# Patient Record
Sex: Female | Born: 1990 | Race: Black or African American | Hispanic: No | Marital: Single | Smoking: Former smoker
Health system: Southern US, Community
[De-identification: ages and names within clinical notes are randomized; demographics above are authoritative.]

## PROBLEM LIST (undated history)

## (undated) ENCOUNTER — Inpatient Hospital Stay (HOSPITAL_COMMUNITY): Payer: Self-pay

## (undated) DIAGNOSIS — Z789 Other specified health status: Secondary | ICD-10-CM

## (undated) DIAGNOSIS — O4290 Premature rupture of membranes, unspecified as to length of time between rupture and onset of labor, unspecified weeks of gestation: Secondary | ICD-10-CM

## (undated) DIAGNOSIS — I10 Essential (primary) hypertension: Secondary | ICD-10-CM

## (undated) DIAGNOSIS — A64 Unspecified sexually transmitted disease: Secondary | ICD-10-CM

## (undated) HISTORY — PX: TOOTH EXTRACTION: SUR596

## (undated) HISTORY — DX: Premature rupture of membranes, unspecified as to length of time between rupture and onset of labor, unspecified weeks of gestation: O42.90

---
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
## 2012-06-06 ENCOUNTER — Encounter (HOSPITAL_COMMUNITY): Payer: Self-pay | Admitting: *Deleted

## 2012-06-06 ENCOUNTER — Emergency Department (HOSPITAL_COMMUNITY)
Admission: EM | Admit: 2012-06-06 | Discharge: 2012-06-06 | Disposition: A | Discharge status: Home / Self Care | Payer: Self-pay | Attending: Emergency Medicine | Admitting: Emergency Medicine

## 2012-06-06 DIAGNOSIS — F17200 Nicotine dependence, unspecified, uncomplicated: Secondary | ICD-10-CM | POA: Insufficient documentation

## 2012-06-06 DIAGNOSIS — M549 Dorsalgia, unspecified: Secondary | ICD-10-CM

## 2012-06-06 DIAGNOSIS — N390 Urinary tract infection, site not specified: Secondary | ICD-10-CM

## 2012-06-06 LAB — URINALYSIS, ROUTINE W REFLEX MICROSCOPIC
Bilirubin Urine: NEGATIVE
Glucose, UA: NEGATIVE mg/dL
Ketones, ur: NEGATIVE mg/dL
Nitrite: NEGATIVE
Protein, ur: 30 mg/dL — AB
Specific Gravity, Urine: 1.012 (ref 1.005–1.030)
Urobilinogen, UA: 2 mg/dL — ABNORMAL HIGH (ref 0.0–1.0)
pH: 6 (ref 5.0–8.0)

## 2012-06-06 LAB — URINE MICROSCOPIC-ADD ON

## 2012-06-06 LAB — PREGNANCY, URINE: Preg Test, Ur: NEGATIVE

## 2012-06-06 MED ORDER — IBUPROFEN 400 MG PO TABS
400.0000 mg | ORAL_TABLET | Freq: Once | ORAL | Status: AC
  Administered 2012-06-06: 400 mg via ORAL
  Filled 2012-06-06: qty 1

## 2012-06-06 MED ORDER — SULFAMETHOXAZOLE-TRIMETHOPRIM 800-160 MG PO TABS: 1.0000 | ORAL_TABLET | Freq: Two times a day (BID) | ORAL | Status: AC

## 2012-06-06 NOTE — ED Provider Notes (Signed)
History   This chart was scribed for Raeford Razor, MD by Charolett Bumpers . The patient was seen in room TR07C/TR07C.    CSN: [id number]  Arrival date & time 06/06/12  1624   First MD Initiated Contact with Patient 06/06/12 1717      Chief Complaint  Patient presents with  . Back Pain    (Consider location/radiation/quality/duration/timing/severity/associated sxs/prior treatment) HPI Carrie Hampton is a 21 y.o. female who presents to the Emergency Department complaining of constant, moderate lower back pain for the past 4 days. Patient also reports associated lower abdominal pain and increased urinary frequency. Patient denies any urinary incontinence. Pt states that she works at [employer] and does a lot of standing at work and has worked multiple shifts over the past few days. Pt states that she took Hydrocodone with no relief of the back pain. Pt reports that she bleed for 2 weeks last month with her menstrual period, and started spotting today. Patient states that this is atypical for her menstrual periods. Pt states that she is sexually active. Pt denies any h/o medical problems. Pt denies any h/o back surgeries. Pt denies taking any blood thinners or h/o IV drugs  History reviewed. No pertinent past medical history.  History reviewed. No pertinent past surgical history.  No family history on file.  History  Substance Use Topics  . Smoking status: Current Everyday Smoker  . Smokeless tobacco: Not on file  . Alcohol Use: Yes    OB History    Grav Para Term Preterm Abortions TAB SAB Ect Mult Living                  Review of Systems  Gastrointestinal: Positive for abdominal pain.       No bowel incontinence.   Genitourinary: Positive for frequency.       No urinary incontinence.   Musculoskeletal: Positive for back pain.    Allergies  Review of patient's allergies indicates no known allergies.  Home Medications   Current Outpatient Rx  Name Route Sig  Dispense Refill  . SULFAMETHOXAZOLE-TRIMETHOPRIM 800-160 MG PO TABS Oral Take 1 tablet by mouth 2 (two) times daily. 10 tablet 0    BP 109/75  Pulse 97  Temp 98.7 F (37.1 C) (Oral)  Resp 20  SpO2 100%  LMP 06/06/2012  Physical Exam  Nursing note and vitals reviewed. Constitutional: She is oriented to person, place, and time. She appears well-developed and well-nourished. No distress.  HENT:  Head: Normocephalic and atraumatic.  Eyes: EOM are normal.  Neck: Normal range of motion. Neck supple. No tracheal deviation present.  Cardiovascular: Normal rate, regular rhythm and normal heart sounds.   No murmur heard. Pulmonary/Chest: Effort normal and breath sounds normal. No respiratory distress. She has no wheezes.  Musculoskeletal: Normal range of motion.       Mild lower thoracic perivertebral tenderness. No midline spinal tenderness.    Neurological: She is alert and oriented to person, place, and time.       Gait is steady. Sensation intact with light touch. Strength normal in lower extremities.   Skin: Skin is warm and dry.       No overlaying skin changes.  Psychiatric: She has a normal mood and affect. Her behavior is normal.    ED Course  Procedures (including critical care time)  DIAGNOSTIC STUDIES: Oxygen Saturation is 100% on room air, normal by my interpretation.    COORDINATION OF CARE:  1731: Discussed planned course of  treatment with the patient, who is agreeable at this time. Will order a urinalysis and pregnancy test.  1745: Medication Orders: Ibuprofen (Advil, Motrin) tablet 400 mg-once.    Labs Reviewed  URINALYSIS, ROUTINE W REFLEX MICROSCOPIC - Abnormal; Notable for the following:    APPearance CLOUDY (*)     Hgb urine dipstick LARGE (*)     Protein, ur 30 (*)     Urobilinogen, UA 2.0 (*)     Leukocytes, UA LARGE (*)     All other components within normal limits  URINE MICROSCOPIC-ADD ON - Abnormal; Notable for the following:    Squamous  Epithelial / LPF FEW (*)     All other components within normal limits  PREGNANCY, URINE   No results found.   1. Back pain   2. UTI (lower urinary tract infection)       MDM  21yF with atraumatic back pain. Nonfocal neuro exam. Urinary frequency and UA suggestive of infection. Plan abx and symptomatic tx of back pain. Return precautions discussed.  I personally preformed the services scribed in my presence. The recorded information has been reviewed and considered. Raeford Razor, MD.        Raeford Razor, MD 06/13/12 [phone]

## 2012-06-06 NOTE — ED Notes (Signed)
The pt is c/o lower back pain after standing up at work at [employer] 4 days  In a row

## 2012-11-19 ENCOUNTER — Encounter (HOSPITAL_COMMUNITY): Payer: Self-pay | Admitting: *Deleted

## 2012-11-19 ENCOUNTER — Inpatient Hospital Stay (HOSPITAL_COMMUNITY)
Admission: AD | Admit: 2012-11-19 | Discharge: 2012-11-19 | Disposition: A | Discharge status: Home / Self Care | Payer: Self-pay | Source: Ambulatory Visit | Attending: Obstetrics & Gynecology | Admitting: Obstetrics & Gynecology

## 2012-11-19 ENCOUNTER — Inpatient Hospital Stay (HOSPITAL_COMMUNITY): Payer: Self-pay

## 2012-11-19 DIAGNOSIS — O209 Hemorrhage in early pregnancy, unspecified: Secondary | ICD-10-CM | POA: Insufficient documentation

## 2012-11-19 HISTORY — DX: Other specified health status: Z78.9

## 2012-11-19 LAB — URINALYSIS, ROUTINE W REFLEX MICROSCOPIC
Leukocytes, UA: NEGATIVE
Nitrite: NEGATIVE
Protein, ur: NEGATIVE mg/dL
Specific Gravity, Urine: >1.03 — ABNORMAL HIGH (ref 1.005–1.030)
Urobilinogen, UA: 0.2 mg/dL (ref 0.0–1.0)

## 2012-11-19 LAB — CBC WITH DIFFERENTIAL/PLATELET
Basophils Absolute: 0 10*3/uL (ref 0.0–0.1)
Eosinophils Absolute: 0.1 10*3/uL (ref 0.0–0.7)
Eosinophils Relative: 2 % (ref 0–5)
Lymphocytes Relative: 32 % (ref 12–46)
Lymphs Abs: 2.3 10*3/uL (ref 0.7–4.0)
MCH: 29 pg (ref 26.0–34.0)
MCV: 84.7 fL (ref 78.0–100.0)
Neutrophils Relative %: 60 % (ref 43–77)
Platelets: 204 10*3/uL (ref 150–400)
RBC: 4.72 MIL/uL (ref 3.87–5.11)
RDW: 12.1 % (ref 11.5–15.5)
WBC: 7 10*3/uL (ref 4.0–10.5)

## 2012-11-19 LAB — POCT PREGNANCY, URINE: Preg Test, Ur: POSITIVE — AB

## 2012-11-19 LAB — HCG, QUANTITATIVE, PREGNANCY: hCG, Beta Chain, Quant, S: 1907 m[IU]/mL — ABNORMAL HIGH (ref <5)

## 2012-11-19 LAB — WET PREP, GENITAL
Clue Cells Wet Prep HPF POC: NONE SEEN
Trich, Wet Prep: NONE SEEN
Yeast Wet Prep HPF POC: NONE SEEN

## 2012-11-19 LAB — ABO/RH: ABO/RH(D): O POS

## 2012-11-19 LAB — URINE MICROSCOPIC-ADD ON

## 2012-11-19 NOTE — MAU Provider Note (Signed)
History     CSN: [id number]  Arrival date and time: 11/19/12 1642   First Provider Initiated Contact with Patient 11/19/12 1727      Chief Complaint  Patient presents with  . Possible Pregnancy   HPI: Carrie Hampton is a 21 y.o. female who presents to MAU with vaginal bleeding. The bleeding started today. She describes the bleeding as lighter than a period. Associated symptoms include abdominal cramping. LMP 09/10/12. No birth control. Last pap smear = never had one. Current sex partner x 18 months. Hx of Chlamydia 2 months ago. Patient and partner treated. The history was provided by the patient.  OB History    Grav Para Term Preterm Abortions TAB SAB Ect Mult Living   0               Past Medical History  Diagnosis Date  . No pertinent past medical history     Past Surgical History  Procedure Date  . No past surgeries     Family History  Problem Relation Age of Onset  . Alcohol abuse Neg Hx   . Arthritis Neg Hx   . Asthma Neg Hx   . Birth defects Neg Hx   . Cancer Neg Hx   . COPD Neg Hx   . Depression Neg Hx   . Diabetes Neg Hx   . Drug abuse Neg Hx   . Early death Neg Hx   . Hearing loss Neg Hx   . Heart disease Neg Hx   . Hyperlipidemia Neg Hx   . Hypertension Neg Hx   . Kidney disease Neg Hx   . Learning disabilities Neg Hx   . Mental illness Neg Hx   . Mental retardation Neg Hx   . Miscarriages / Stillbirths Neg Hx   . Stroke Neg Hx   . Vision loss Neg Hx     History  Substance Use Topics  . Smoking status: Former Smoker    Quit date: 10/20/2012  . Smokeless tobacco: Not on file  . Alcohol Use: Yes    Allergies: No Known Allergies  No prescriptions prior to admission    Review of Systems  Constitutional: Negative for fever, chills and weight loss.  HENT: Negative for ear pain, nosebleeds, congestion, sore throat and neck pain.   Eyes: Negative for blurred vision, double vision, photophobia and pain.  Respiratory: Negative for cough,  shortness of breath and wheezing.   Cardiovascular: Negative for chest pain, palpitations and leg swelling.  Gastrointestinal: Positive for abdominal pain. Negative for heartburn, nausea, vomiting, diarrhea and constipation.  Genitourinary: Positive for frequency. Negative for dysuria and urgency.  Musculoskeletal: Negative for myalgias and back pain.  Skin: Positive for rash. Negative for itching.  Neurological: Negative for dizziness, sensory change, speech change, seizures, weakness and headaches.  Endo/Heme/Allergies: Does not bruise/bleed easily.  Psychiatric/Behavioral: Negative for depression and substance abuse. The patient is not nervous/anxious and does not have insomnia.    Physical Exam   Blood pressure 112/69, temperature 98.2 F (36.8 C), temperature source Oral, height 5' 3.5" (1.613 m), weight 106 lb 12.8 oz (48.444 kg), last menstrual period 09/10/2012.  Physical Exam  Nursing note and vitals reviewed. Constitutional: She is oriented to person, place, and time. She appears well-developed and well-nourished. No distress.  HENT:  Head: Normocephalic and atraumatic.  Eyes: EOM are normal.  Neck: Neck supple.  Cardiovascular: Normal rate.   Respiratory: Effort normal.  GI: Soft. There is no tenderness.  Genitourinary:  External genitalia without lesions. Moderate blood vaginal vault. Cervix inflamed, closed, no CMT, no adnexal tenderness. Uterus not enlarged.   Musculoskeletal: Normal range of motion.  Neurological: She is alert and oriented to person, place, and time.  Skin: Skin is warm and dry.  Psychiatric: She has a normal mood and affect. Her behavior is normal. Judgment and thought content normal.    Results for orders placed during the hospital encounter of 11/19/12 (from the past 24 hour(s))  URINALYSIS, ROUTINE W REFLEX MICROSCOPIC     Status: Abnormal   Collection Time   11/19/12  4:55 PM      Component Value Range   Color, Urine YELLOW  YELLOW    APPearance CLEAR  CLEAR   Specific Gravity, Urine >1.030 (*) 1.005 - 1.030   pH 6.0  5.0 - 8.0   Glucose, UA NEGATIVE  NEGATIVE mg/dL   Hgb urine dipstick LARGE (*) NEGATIVE   Bilirubin Urine NEGATIVE  NEGATIVE   Ketones, ur 15 (*) NEGATIVE mg/dL   Protein, ur NEGATIVE  NEGATIVE mg/dL   Urobilinogen, UA 0.2  0.0 - 1.0 mg/dL   Nitrite NEGATIVE  NEGATIVE   Leukocytes, UA NEGATIVE  NEGATIVE  URINE MICROSCOPIC-ADD ON     Status: Abnormal   Collection Time   11/19/12  4:55 PM      Component Value Range   Squamous Epithelial / LPF FEW (*) RARE   WBC, UA 0-2  <3 WBC/hpf   RBC / HPF 21-50  <3 RBC/hpf   Urine-Other MUCOUS PRESENT    POCT PREGNANCY, URINE     Status: Abnormal   Collection Time   11/19/12  5:06 PM      Component Value Range   Preg Test, Ur POSITIVE (*) NEGATIVE  CBC WITH DIFFERENTIAL     Status: Normal   Collection Time   11/19/12  5:45 PM      Component Value Range   WBC 7.0  4.0 - 10.5 K/uL   RBC 4.72  3.87 - 5.11 MIL/uL   Hemoglobin 13.7  12.0 - 15.0 g/dL   HCT 38.7  56.4 - 33.2 %   MCV 84.7  78.0 - 100.0 fL   MCH 29.0  26.0 - 34.0 pg   MCHC 34.3  30.0 - 36.0 g/dL   RDW 95.1  88.4 - 16.6 %   Platelets 204  150 - 400 K/uL   Neutrophils Relative 60  43 - 77 %   Neutro Abs 4.2  1.7 - 7.7 K/uL   Lymphocytes Relative 32  12 - 46 %   Lymphs Abs 2.3  0.7 - 4.0 K/uL   Monocytes Relative 5  3 - 12 %   Monocytes Absolute 0.4  0.1 - 1.0 K/uL   Eosinophils Relative 2  0 - 5 %   Eosinophils Absolute 0.1  0.0 - 0.7 K/uL   Basophils Relative 0  0 - 1 %   Basophils Absolute 0.0  0.0 - 0.1 K/uL  HCG, QUANTITATIVE, PREGNANCY     Status: Abnormal   Collection Time   11/19/12  5:45 PM      Component Value Range   hCG, Beta Chain, Quant, S 1907 (*) <5 mIU/mL  ABO/RH     Status: Normal (Preliminary result)   Collection Time   11/19/12  5:45 PM      Component Value Range   ABO/RH(D) O POS    WET PREP, GENITAL     Status: Abnormal   Collection Time  11/19/12  6:03 PM       Component Value Range   Yeast Wet Prep HPF POC NONE SEEN  NONE SEEN   Trich, Wet Prep NONE SEEN  NONE SEEN   Clue Cells Wet Prep HPF POC NONE SEEN  NONE SEEN   WBC, Wet Prep HPF POC FEW (*) NONE SEEN   US Ob Comp Less 14 Wks  11/19/2012  *RADIOLOGY REPORT*  Clinical Data: Cramping, bleeding, pregnant, quantitative beta HCG 1907  OBSTETRIC <14 WK ULTRASOUND  Technique:  Transabdominal ultrasound was performed for evaluation of the gestation as well as the maternal uterus and adnexal regions.  Comparison:  None  Intrauterine gestational sac: Not identified Yolk sac: N/A Embryo: N/A Cardiac Activity: N/A Heart Rate: N/A bpm  Maternal uterus/Adnexae: Endometrial complex normal appearance 10 mm thick without evidence of a gestational sac or fluid. No focal uterine mass identified. Right ovary measures 3.9 x 4.0 x 1.8 cm and contains a corpus luteal cyst 1.4 cm diameter. Left ovary measures 3.6 x 2.0 x 1.7 cm in size and is normal appearance. No adnexal masses or free pelvic fluid.  IMPRESSION: No intrauterine or extrauterine gestation is identified. Differential diagnosis includes early intrauterine pregnancy too early to visualize, spontaneous abortion, and ectopic pregnancy. Serial quantitative beta HCG and/or follow-up ultrasound recommended to definitively exclude ectopic pregnancy.   Original Report Authenticated By: Ulyses Southward, M.D.    US Ob Transvaginal  11/19/2012  *RADIOLOGY REPORT*  Clinical Data: Cramping, bleeding, pregnant, quantitative beta HCG 1907  OBSTETRIC <14 WK ULTRASOUND  Technique:  Transabdominal ultrasound was performed for evaluation of the gestation as well as the maternal uterus and adnexal regions.  Comparison:  None  Intrauterine gestational sac: Not identified Yolk sac: N/A Embryo: N/A Cardiac Activity: N/A Heart Rate: N/A bpm  Maternal uterus/Adnexae: Endometrial complex normal appearance 10 mm thick without evidence of a gestational sac or fluid. No focal uterine mass  identified. Right ovary measures 3.9 x 4.0 x 1.8 cm and contains a corpus luteal cyst 1.4 cm diameter. Left ovary measures 3.6 x 2.0 x 1.7 cm in size and is normal appearance. No adnexal masses or free pelvic fluid.  IMPRESSION: No intrauterine or extrauterine gestation is identified. Differential diagnosis includes early intrauterine pregnancy too early to visualize, spontaneous abortion, and ectopic pregnancy. Serial quantitative beta HCG and/or follow-up ultrasound recommended to definitively exclude ectopic pregnancy.   Original Report Authenticated By: Ulyses Southward, M.D.     Assessment: 21 y.o. female with bleeding in early pregnancy  Diff Dx: SAB   Ectopic pregnancy   Early IUP  Plan:  Repeat Bhcg in 48 hours   Discussed with patient probably SAB   Tissue patient brought in sent to pathology lab     I have reviewed this patient's vital signs, nurses notes, appropriate labs and imaging. I have discussed the results with the  Patient and she voices understanding. MAU Course  Procedures  NEESE,HOPE 11/19/2012, 7:20 PM

## 2012-11-19 NOTE — MAU Provider Note (Signed)

## 2012-11-19 NOTE — MAU Note (Signed)
Patient states she has always had irregular periods. Had a "glob" of thick discharge with some blood in it. Having abdominal cramping since this am.

## 2012-11-19 NOTE — MAU Note (Signed)
Pt states she started having vaginal bleeding today and was passing thick clots.

## 2012-11-22 ENCOUNTER — Inpatient Hospital Stay (HOSPITAL_COMMUNITY)
Admission: AD | Admit: 2012-11-22 | Discharge: 2012-11-22 | Disposition: A | Discharge status: Home / Self Care | Payer: Self-pay | Source: Ambulatory Visit | Attending: Obstetrics & Gynecology | Admitting: Obstetrics & Gynecology

## 2012-11-22 DIAGNOSIS — O039 Complete or unspecified spontaneous abortion without complication: Secondary | ICD-10-CM | POA: Insufficient documentation

## 2012-11-22 NOTE — MAU Provider Note (Signed)
Carrie Hampton [birth year]  S:  Patient is here for follow up b-HCG level. Seen in MAU on 11/19/12 with vaginal bleeding and cramping. Her quant-HCG was 1907 at that time and ultrasound showed no intrauterine gestational sac or adnexal masses. She has had some abdominal cramping last night and this morning but is pain free at this time. She denies fever/chills. Her bleeding is minimal at this time.  O:   Filed Vitals:   11/22/12 1252  BP: 106/65  Pulse: 74  Temp: 98.1 F (36.7 C)  Resp: 16   GEN:  WNWD, no distress ABD:  Non-tender, no guarding or rebound EXTREM:  Warm, well perfused NEURO:  Alert and oriented x 4  Results for orders placed during the hospital encounter of 11/22/12 (from the past 72 hour(s))  HCG, QUANTITATIVE, PREGNANCY     Status: Abnormal   Collection Time   11/22/12 12:22 PM      Component Value Range Comment   hCG, Beta Chain, Quant, S 287 (*) <5 mIU/mL     A/P 21 y.o. G1P0010 with spontaneous abortion in first trimester. -  F/U in [facility] gyn clinic in one week for quant HCG - Strict precautions given regarding return for pain/bleeding - Patient verbalized understanding.  Napoleon Form, MD 11/22/2012 2:55 PM

## 2012-11-22 NOTE — MAU Note (Signed)
Pt states was here 11/19/2012, came on her cycle late, then noted bleeding with "chunks" of ?tissue, found out same day she was pregnant, u/s saw nothing in uterus. Here today for repeat bhcg, still bleeding, noted "chunk" this am, denies pain at present. Did have cramping last pm and earlier this am.

## 2012-11-26 NOTE — MAU Provider Note (Signed)
Attestation of Attending Supervision of Advanced Practitioner (CNM/NP): Evaluation and management procedures were performed by the Advanced Practitioner under my supervision and collaboration. I have reviewed the Advanced Practitioner's note and chart, and I agree with the management and plan.  Huel Centola H. 7:47 PM   

## 2012-12-09 ENCOUNTER — Encounter: Payer: Self-pay | Admitting: Advanced Practice Midwife

## 2013-04-01 ENCOUNTER — Inpatient Hospital Stay (HOSPITAL_COMMUNITY): Payer: Self-pay

## 2013-04-01 ENCOUNTER — Encounter (HOSPITAL_COMMUNITY): Payer: Self-pay

## 2013-04-01 ENCOUNTER — Inpatient Hospital Stay (HOSPITAL_COMMUNITY)
Admission: AD | Admit: 2013-04-01 | Discharge: 2013-04-01 | Disposition: A | Discharge status: Home / Self Care | Payer: Self-pay | Source: Ambulatory Visit | Attending: Obstetrics & Gynecology | Admitting: Obstetrics & Gynecology

## 2013-04-01 DIAGNOSIS — O4690 Antepartum hemorrhage, unspecified, unspecified trimester: Secondary | ICD-10-CM

## 2013-04-01 DIAGNOSIS — O209 Hemorrhage in early pregnancy, unspecified: Secondary | ICD-10-CM | POA: Insufficient documentation

## 2013-04-01 LAB — CBC
HCT: 40.8 % (ref 36.0–46.0)
MCH: 28.9 pg (ref 26.0–34.0)
MCHC: 33.8 g/dL (ref 30.0–36.0)
MCV: 85.5 fL (ref 78.0–100.0)
Platelets: 186 10*3/uL (ref 150–400)
RDW: 12.4 % (ref 11.5–15.5)

## 2013-04-01 LAB — URINALYSIS, ROUTINE W REFLEX MICROSCOPIC
Glucose, UA: NEGATIVE mg/dL
Ketones, ur: NEGATIVE mg/dL
Protein, ur: NEGATIVE mg/dL
Urobilinogen, UA: 1 mg/dL (ref 0.0–1.0)

## 2013-04-01 LAB — URINE MICROSCOPIC-ADD ON

## 2013-04-01 NOTE — MAU Provider Note (Signed)
History     CSN: [id number]  Arrival date and time: 04/01/13 1550   First Provider Initiated Contact with Patient 04/01/13 1640      Chief Complaint  Patient presents with  . Vaginal Bleeding   HPI  Pt is pregnant G1P0010and presents with irregular bleeding for about 3 weeks since April.  She  bled for 4 days and stopped for 2 days and then started again for 3 days and just started 03/29/2013.  She denies cramping with her bleeding.  She has the same partner for 2 years.  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 Pt denies pain with urination, constipation or diarrhea.  Pt denies dyspareunia.    Past Medical History  Diagnosis Date  . No pertinent past medical history     Past Surgical History  Procedure Laterality Date  . No past surgeries      Family History  Problem Relation Age of Onset  . Alcohol abuse Neg Hx   . Arthritis Neg Hx   . Asthma Neg Hx   . Birth defects Neg Hx   . Cancer Neg Hx   . COPD Neg Hx   . Depression Neg Hx   . Diabetes Neg Hx   . Drug abuse Neg Hx   . Early death Neg Hx   . Hearing loss Neg Hx   . Heart disease Neg Hx   . Hyperlipidemia Neg Hx   . Hypertension Neg Hx   . Kidney disease Neg Hx   . Learning disabilities Neg Hx   . Mental illness Neg Hx   . Mental retardation Neg Hx   . Miscarriages / Stillbirths Neg Hx   . Stroke Neg Hx   . Vision loss Neg Hx     History  Substance Use Topics  . Smoking status: Former Smoker    Quit date: 10/20/2012  . Smokeless tobacco: Not on file  . Alcohol Use: 1.2 oz/week    2 Shots of liquor per week     Comment: patient states that she drinks liquor once a week. 04/01/13    Allergies: No Known Allergies  Prescriptions prior to admission  Medication Sig Dispense Refill  . ibuprofen (ADVIL,MOTRIN) 200 MG tablet Take 400-600 mg by mouth every 6 (six) hours as needed for pain (For headache.).        Review of Systems  Constitutional: Negative for fever and chills.  HENT:   None now  Gastrointestinal: Negative for nausea, vomiting, abdominal pain, diarrhea and constipation.  Genitourinary: Negative for dysuria and urgency.  Neurological: Positive for headaches.   Physical Exam   Blood pressure 118/74, pulse 77, temperature 98 F (36.7 C), temperature source Oral, resp. rate 16, last menstrual period 03/29/2013, SpO2 100.00%.  Physical Exam  Nursing note and vitals reviewed. Constitutional: She is oriented to person, place, and time. She appears well-developed and well-nourished. No distress.  HENT:  Head: Normocephalic.  Eyes: Pupils are equal, round, and reactive to light.  Neck: Normal range of motion. Neck supple.  Cardiovascular: Normal rate.   Respiratory: Effort normal.  GI: Soft. She exhibits no distension. There is no tenderness. There is no rebound and no guarding.  Genitourinary:  Small amount of dark red blood in vault; cervix clean, NT, closed; uterus freely mobile nontender without palpable enlargement or tenderness- adnexa without palpable enlargement or tenderness  Musculoskeletal: Normal range of motion.  Neurological: She is alert and oriented to person, place, and time.  Skin:  Skin is warm and dry.  Psychiatric: She has a normal mood and affect.    MAU Course  Procedures Results for orders placed during the hospital encounter of 04/01/13 (from the past 24 hour(s))  CBC     Status: None   Collection Time    04/01/13  4:05 PM      Result Value Range   WBC 4.8  4.0 - 10.5 K/uL   RBC 4.77  3.87 - 5.11 MIL/uL   Hemoglobin 13.8  12.0 - 15.0 g/dL   HCT 11.9  14.7 - 82.9 %   MCV 85.5  78.0 - 100.0 fL   MCH 28.9  26.0 - 34.0 pg   MCHC 33.8  30.0 - 36.0 g/dL   RDW 56.2  13.0 - 86.5 %   Platelets 186  150 - 400 K/uL  HCG, QUANTITATIVE, PREGNANCY     Status: Abnormal   Collection Time    04/01/13  4:05 PM      Result Value Range   hCG, Beta Chain, Quant, S 131 (*) <5 mIU/mL  URINALYSIS, ROUTINE W REFLEX MICROSCOPIC     Status:  Abnormal   Collection Time    04/01/13  4:20 PM      Result Value Range   Color, Urine YELLOW  YELLOW   APPearance CLEAR  CLEAR   Specific Gravity, Urine 1.020  1.005 - 1.030   pH 6.0  5.0 - 8.0   Glucose, UA NEGATIVE  NEGATIVE mg/dL   Hgb urine dipstick MODERATE (*) NEGATIVE   Bilirubin Urine NEGATIVE  NEGATIVE   Ketones, ur NEGATIVE  NEGATIVE mg/dL   Protein, ur NEGATIVE  NEGATIVE mg/dL   Urobilinogen, UA 1.0  0.0 - 1.0 mg/dL   Nitrite NEGATIVE  NEGATIVE   Leukocytes, UA NEGATIVE  NEGATIVE  URINE MICROSCOPIC-ADD ON     Status: Abnormal   Collection Time    04/01/13  4:20 PM      Result Value Range   Squamous Epithelial / LPF FEW (*) RARE   WBC, UA 0-2  <3 WBC/hpf   RBC / HPF 0-2  <3 RBC/hpf   Bacteria, UA FEW (*) RARE   Urine-Other STARCH CRYSTALS PRESENT    POCT PREGNANCY, URINE     Status: Abnormal   Collection Time    04/01/13  4:50 PM      Result Value Range   Preg Test, Ur POSITIVE (*) NEGATIVE  WET PREP, GENITAL     Status: Abnormal   Collection Time    04/01/13  5:05 PM      Result Value Range   Yeast Wet Prep HPF POC NONE SEEN  NONE SEEN   Trich, Wet Prep NONE SEEN  NONE SEEN   Clue Cells Wet Prep HPF POC FEW (*) NONE SEEN   WBC, Wet Prep HPF POC FEW (*) NONE SEEN  US Ob Comp Less 14 Wks  04/01/2013  *RADIOLOGY REPORT*  Clinical Data: Bleeding, pregnant  OBSTETRIC <14 WK ULTRASOUND  Technique:  Transabdominal ultrasound was performed for evaluation of the gestation as well as the maternal uterus and adnexal regions.  Comparison:  11/19/2012  Intrauterine gestational sac: None seen Yolk sac: None seen Embryo: None seen  Maternal uterus/Adnexae: Uterus unremarkable.  Normal endometrial thickness of 2.2 mm. Ovaries unremarkable with normal follicles.  No free fluid.  IMPRESSION:  No evidence of intrauterine gestation.  No ultrasound findings to suggest ectopic pregnancy.  Consider follow-up quantitative beta HCG with repeat ultrasound as indicated.  Original Report  Authenticated By: D. Andria Rhein, MD    US Ob Transvaginal  04/01/2013  *RADIOLOGY REPORT*  Clinical Data: Bleeding, pregnant  OBSTETRIC <14 WK ULTRASOUND  Technique:  Transabdominal ultrasound was performed for evaluation of the gestation as well as the maternal uterus and adnexal regions.  Comparison:  11/19/2012  Intrauterine gestational sac: None seen Yolk sac: None seen Embryo: None seen  Maternal uterus/Adnexae: Uterus unremarkable.  Normal endometrial thickness of 2.2 mm. Ovaries unremarkable with normal follicles.  No free fluid.  IMPRESSION:  No evidence of intrauterine gestation.  No ultrasound findings to suggest ectopic pregnancy.  Consider follow-up quantitative beta HCG with repeat ultrasound as indicated.   Original Report Authenticated By: D. Andria Rhein, MD    GC/Chlamydia pending   Assessment and Plan  Bleeding in early pregnancy Repeat HCG 48 hours  Vernice Bowker 04/01/2013, 4:43 PM

## 2013-04-01 NOTE — MAU Note (Signed)
Patient is in with c/o irregular bleeding. She states that she has irregular vaginal bleeding (4 days bleeding, stops for 2 days and start bleeding again for 3 days) for a month. She states that she started bleeding again 03/29/13. She denies pain.

## 2013-04-02 LAB — GC/CHLAMYDIA PROBE AMP
CT Probe RNA: NEGATIVE
GC Probe RNA: NEGATIVE

## 2013-04-04 ENCOUNTER — Inpatient Hospital Stay (HOSPITAL_COMMUNITY)
Admission: AD | Admit: 2013-04-04 | Discharge: 2013-04-04 | Disposition: A | Discharge status: Home / Self Care | Payer: Self-pay | Source: Ambulatory Visit | Attending: Obstetrics & Gynecology | Admitting: Obstetrics & Gynecology

## 2013-04-04 DIAGNOSIS — O039 Complete or unspecified spontaneous abortion without complication: Secondary | ICD-10-CM

## 2013-04-04 DIAGNOSIS — O209 Hemorrhage in early pregnancy, unspecified: Secondary | ICD-10-CM | POA: Insufficient documentation

## 2013-04-04 NOTE — MAU Provider Note (Signed)

## 2013-04-04 NOTE — MAU Note (Signed)
Doing ok. No real pain, small amt of bleeding still

## 2013-04-04 NOTE — MAU Provider Note (Signed)
History     CSN: [id number]  Arrival date and time: 04/04/13 4098   First Provider Initiated Contact with Patient 04/04/13 1015      No chief complaint on file.  HPI This is a 22 y.o. female who presents for followup quantitative HCG.  RN Note: Doing ok. No real pain, small amt of bleeding still  Previous Visit note: Pt is pregnant G1P0010and presents with irregular bleeding for about 3 weeks since April. She bled for 4 days and stopped for 2 days and then started again for 3 days and just started 03/29/2013. She denies cramping with her bleeding. She has the same partner for 2 years. 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 Pt denies pain with urination, constipation or diarrhea. Pt denies dyspareunia   OB History   Grav Para Term Preterm Abortions TAB SAB Ect Mult Living   2    1  1          Past Medical History  Diagnosis Date  . No pertinent past medical history     Past Surgical History  Procedure Laterality Date  . No past surgeries      Family History  Problem Relation Age of Onset  . Alcohol abuse Neg Hx   . Arthritis Neg Hx   . Asthma Neg Hx   . Birth defects Neg Hx   . Cancer Neg Hx   . COPD Neg Hx   . Depression Neg Hx   . Diabetes Neg Hx   . Drug abuse Neg Hx   . Early death Neg Hx   . Hearing loss Neg Hx   . Heart disease Neg Hx   . Hyperlipidemia Neg Hx   . Hypertension Neg Hx   . Kidney disease Neg Hx   . Learning disabilities Neg Hx   . Mental illness Neg Hx   . Mental retardation Neg Hx   . Miscarriages / Stillbirths Neg Hx   . Stroke Neg Hx   . Vision loss Neg Hx     History  Substance Use Topics  . Smoking status: Former Smoker    Quit date: 10/20/2012  . Smokeless tobacco: Not on file  . Alcohol Use: 1.2 oz/week    2 Shots of liquor per week     Comment: patient states that she drinks liquor once a week. 04/01/13    Allergies: No Known Allergies  No prescriptions prior to admission    Review of Systems   Constitutional: Negative for fever and malaise/fatigue.  Gastrointestinal: Negative for nausea, vomiting, abdominal pain, diarrhea and constipation.  Genitourinary:       No bleeding   Neurological: Negative for headaches.   Physical Exam   Last menstrual period 03/10/2013.  Physical Exam  Constitutional: She is oriented to person, place, and time. She appears well-developed and well-nourished. No distress.  Cardiovascular: Normal rate.   Respiratory: Effort normal.  Neurological: She is alert and oriented to person, place, and time.  Skin: Skin is warm and dry.  Psychiatric: She has a normal mood and affect.    MAU Course  Procedures  MDM  Results for orders placed during the hospital encounter of 04/04/13 (from the past 24 hour(s))  HCG, QUANTITATIVE, PREGNANCY     Status: Abnormal   Collection Time    04/04/13  8:55 AM      Result Value Range   hCG, Beta Chain, Quant, S 67 (*) <5 mIU/mL   Quant HCG was 131  three days ago.  Assessment and Plan  A:  Pregnancy at [redacted]w[redacted]d by LMP      Declining Quant HCG      Failed pregnancy  P:  Discussed findings.        Pt states she is not too upset       Suggest followup quant in one week.  [facility] 04/04/2013, 10:15 AM

## 2013-04-10 NOTE — MAU Provider Note (Signed)
Attestation of Attending Supervision of Advanced Practitioner (CNM/NP): Evaluation and management procedures were performed by the Advanced Practitioner under my supervision and collaboration. I have reviewed the Advanced Practitioner's note and chart, and I agree with the management and plan.  Daron Breeding H. 2:43 PM

## 2014-02-04 ENCOUNTER — Encounter (HOSPITAL_COMMUNITY): Payer: Self-pay | Admitting: *Deleted

## 2014-02-15 ENCOUNTER — Inpatient Hospital Stay (HOSPITAL_COMMUNITY)
Admission: AD | Admit: 2014-02-15 | Discharge: 2014-02-15 | Disposition: A | Discharge status: Home / Self Care | Payer: Self-pay | Source: Ambulatory Visit | Attending: Obstetrics and Gynecology | Admitting: Obstetrics and Gynecology

## 2014-02-15 ENCOUNTER — Encounter (HOSPITAL_COMMUNITY): Payer: Self-pay | Admitting: *Deleted

## 2014-02-15 DIAGNOSIS — N926 Irregular menstruation, unspecified: Secondary | ICD-10-CM

## 2014-02-15 DIAGNOSIS — N912 Amenorrhea, unspecified: Secondary | ICD-10-CM | POA: Insufficient documentation

## 2014-02-15 DIAGNOSIS — Z87891 Personal history of nicotine dependence: Secondary | ICD-10-CM | POA: Insufficient documentation

## 2014-02-15 HISTORY — DX: Unspecified sexually transmitted disease: A64

## 2014-02-15 LAB — URINALYSIS, ROUTINE W REFLEX MICROSCOPIC
Bilirubin Urine: NEGATIVE
Glucose, UA: NEGATIVE mg/dL
Hgb urine dipstick: NEGATIVE
KETONES UR: NEGATIVE mg/dL
LEUKOCYTES UA: NEGATIVE
Nitrite: NEGATIVE
PROTEIN: NEGATIVE mg/dL
Specific Gravity, Urine: 1.015 (ref 1.005–1.030)
UROBILINOGEN UA: 1 mg/dL (ref 0.0–1.0)
pH: 7.5 (ref 5.0–8.0)

## 2014-02-15 LAB — POCT PREGNANCY, URINE: PREG TEST UR: NEGATIVE

## 2014-02-15 NOTE — MAU Note (Signed)
Pt states here today because she has not had menstrual cycle x3 months. Has hx of irregular cycles, however usually only skips a month. Also has been getting hot and nauseated at work. Two upt's at home were negative.

## 2014-02-15 NOTE — MAU Provider Note (Signed)
History     CSN: [id number]  Arrival date and time: 02/15/14 [id number]   None     Chief Complaint  Patient presents with  . No menstrual cycle for 3 months    HPI 23 y.o. [id number] with no menses x about 3 months, unsure of exact date of LMP, but states she has not had a period since the first of this year. Periods have been irregular for a long time, but usually only skips 1 month or so. Pt states she has had negative UPT at home x 2. States she works almost every day, has a lot of stress, does not eat regularly. Has had trouble gaining weight.   Past Medical History  Diagnosis Date  . No pertinent past medical history   . STD (female) 2013    Chlamydia    Past Surgical History  Procedure Laterality Date  . No past surgeries      Family History  Problem Relation Age of Onset  . Alcohol abuse Neg Hx   . Arthritis Neg Hx   . Asthma Neg Hx   . Birth defects Neg Hx   . Cancer Neg Hx   . COPD Neg Hx   . Depression Neg Hx   . Diabetes Neg Hx   . Drug abuse Neg Hx   . Early death Neg Hx   . Hearing loss Neg Hx   . Heart disease Neg Hx   . Hyperlipidemia Neg Hx   . Hypertension Neg Hx   . Kidney disease Neg Hx   . Learning disabilities Neg Hx   . Mental illness Neg Hx   . Mental retardation Neg Hx   . Miscarriages / Stillbirths Neg Hx   . Stroke Neg Hx   . Vision loss Neg Hx     History  Substance Use Topics  . Smoking status: Former Smoker    Quit date: 10/20/2012  . Smokeless tobacco: Never Used  . Alcohol Use: 1.2 oz/week    2 Shots of liquor per week     Comment: patient states that she drinks liquor once a week. 04/01/13    Allergies: No Known Allergies  No prescriptions prior to admission    ROS Physical Exam   Blood pressure 108/65, pulse 76, temperature 98 F (36.7 C), temperature source Oral, resp. rate 16, height 5\' 1"  (1.549 m), weight 109 lb (49.442 kg), last menstrual period 11/26/2013, unknown if currently breastfeeding.  Physical Exam   Nursing note and vitals reviewed. Constitutional: She is oriented to person, place, and time. She appears well-developed. No distress.  Thin   HENT:  Head: Normocephalic and atraumatic.  Cardiovascular: Normal rate.   Respiratory: Effort normal.  Neurological: She is alert and oriented to person, place, and time.  Skin: Skin is warm and dry.  Psychiatric: She has a normal mood and affect.    MAU Course  Procedures Results for orders placed during the hospital encounter of 02/15/14 (from the past 24 hour(s))  POCT PREGNANCY, URINE     Status: None   Collection Time    02/15/14  9:35 AM      Result Value Ref Range   Preg Test, Ur NEGATIVE  NEGATIVE     Assessment and Plan   1. Irregular menses   Negative UPT today. Discussed at length reasons for irregular cycles including endocrine problems, stress, underweight. Recommended reducing stress, regular sleep schedule, weight gain with healthy fat/high protein/nutrient dense foods. F/U outpatient for further eval if oligomenorrhea  continues.     Medication List    Notice   You have not been prescribed any medications.          Follow-up Information   Follow up with [facility] [phone]    Contact information:   [address] [place] [address]       Follow up with [facility].   Specialty:  Obstetrics and Gynecology   Contact information:   [address] [place] [address] [phone]        [facility] 02/15/2014, 9:30 AM

## 2014-02-15 NOTE — MAU Provider Note (Signed)
Attestation of Attending Supervision of Advanced Practitioner: Evaluation and management procedures were performed by the PA/NP/CNM/OB Fellow under my supervision/collaboration. Chart reviewed and agree with management and plan.  Reginal Wojcicki V 02/15/2014 11:14 PM

## 2014-03-14 ENCOUNTER — Encounter (HOSPITAL_COMMUNITY): Payer: Self-pay | Admitting: Emergency Medicine

## 2014-03-14 ENCOUNTER — Emergency Department (HOSPITAL_COMMUNITY)
Admission: EM | Admit: 2014-03-14 | Discharge: 2014-03-14 | Disposition: A | Discharge status: Home / Self Care | Payer: Self-pay | Attending: Emergency Medicine | Admitting: Emergency Medicine

## 2014-03-14 DIAGNOSIS — Z87891 Personal history of nicotine dependence: Secondary | ICD-10-CM | POA: Insufficient documentation

## 2014-03-14 DIAGNOSIS — Z8619 Personal history of other infectious and parasitic diseases: Secondary | ICD-10-CM | POA: Insufficient documentation

## 2014-03-14 DIAGNOSIS — K047 Periapical abscess without sinus: Secondary | ICD-10-CM | POA: Insufficient documentation

## 2014-03-14 DIAGNOSIS — K029 Dental caries, unspecified: Secondary | ICD-10-CM | POA: Insufficient documentation

## 2014-03-14 MED ORDER — HYDROCODONE-ACETAMINOPHEN 5-325 MG PO TABS: 1.0000 | ORAL_TABLET | Freq: Four times a day (QID) | ORAL | Status: DC | PRN

## 2014-03-14 MED ORDER — PENICILLIN V POTASSIUM 500 MG PO TABS: 500.0000 mg | ORAL_TABLET | Freq: Three times a day (TID) | ORAL | Status: DC

## 2014-03-14 NOTE — Discharge Instructions (Signed)
°  Dental Abscess °A dental abscess is a collection of infected fluid (pus) from a bacterial infection in the inner part of the tooth (pulp). It usually occurs at the end of the tooth's root.  °CAUSES  °· Severe tooth decay. °· Trauma to the tooth that allows bacteria to enter into the pulp, such as a broken or chipped tooth. °SYMPTOMS  °· Severe pain in and around the infected tooth. °· Swelling and redness around the abscessed tooth or in the mouth or face. °· Tenderness. °· Pus drainage. °· Bad breath. °· Bitter taste in the mouth. °· Difficulty swallowing. °· Difficulty opening the mouth. °· Nausea. °· Vomiting. °· Chills. °· Swollen neck glands. °DIAGNOSIS  °· A medical and dental history will be taken. °· An examination will be performed by tapping on the abscessed tooth. °· X-rays may be taken of the tooth to identify the abscess. °TREATMENT °The goal of treatment is to eliminate the infection. You may be prescribed antibiotic medicine to stop the infection from spreading. A root canal may be performed to save the tooth. If the tooth cannot be saved, it may be pulled (extracted) and the abscess may be drained.  °HOME CARE INSTRUCTIONS °· Only take over-the-counter or prescription medicines for pain, fever, or discomfort as directed by your caregiver. °· Rinse your mouth (gargle) often with salt water (¼ tsp salt in 8 oz [250 ml] of warm water) to relieve pain or swelling. °· Do not drive after taking pain medicine (narcotics). °· Do not apply heat to the outside of your face. °· Return to your dentist for further treatment as directed. °SEEK MEDICAL CARE IF: °· Your pain is not helped by medicine. °· Your pain is getting worse instead of better. °SEEK IMMEDIATE MEDICAL CARE IF: °· You have a fever or persistent symptoms for more than 2 3 days. °· You have a fever and your symptoms suddenly get worse. °· You have chills or a very bad headache. °· You have problems breathing or swallowing. °· You have trouble  opening your mouth. °· You have swelling in the neck or around the eye. °Document Released: 11/13/2005 Document Revised: 08/07/2012 Document Reviewed: 02/21/2011 °ExitCare® Patient Information ©2014 [employer]. ° ° °

## 2014-03-14 NOTE — ED Provider Notes (Signed)
CSN: [id number]     Arrival date & time 03/14/14  1212 History  This chart was scribed for non-physician practitioner, Fayrene HelperBowie Keiosha Cancro, PA-C,working with Rolan BuccoMelanie Belfi, MD, by Karle PlumberJennifer Tensley, ED Scribe.  This patient was seen in room WA04/WA04 and the patient's care was started at 12:38 PM.  Chief Complaint  Patient presents with  . Dental Pain   The history is provided by the patient. No language interpreter was used.   HPI Comments:  [name] is a 23 y.o. female who presents to the Emergency Department complaining of severe dental pain and swelling of the gums that started approximately six days ago. She states the gums are causing her the pain, not the teeth. She states talking, chewing, drinking, hot air and cold air make the pain worse. She states she has had this type of pain before due to similar dental issues. She states she has taken OTC Ibuprofen with some relief at times and no relief other times. She denies hearing loss, fever, and chills.  Past Medical History  Diagnosis Date  . No pertinent past medical history   . STD (female) 2013    Chlamydia   Past Surgical History  Procedure Laterality Date  . No past surgeries     Family History  Problem Relation Age of Onset  . Alcohol abuse Neg Hx   . Arthritis Neg Hx   . Asthma Neg Hx   . Birth defects Neg Hx   . Cancer Neg Hx   . COPD Neg Hx   . Depression Neg Hx   . Diabetes Neg Hx   . Drug abuse Neg Hx   . Early death Neg Hx   . Hearing loss Neg Hx   . Heart disease Neg Hx   . Hyperlipidemia Neg Hx   . Hypertension Neg Hx   . Kidney disease Neg Hx   . Learning disabilities Neg Hx   . Mental illness Neg Hx   . Mental retardation Neg Hx   . Miscarriages / Stillbirths Neg Hx   . Stroke Neg Hx   . Vision loss Neg Hx    History  Substance Use Topics  . Smoking status: Former Smoker    Quit date: 10/20/2012  . Smokeless tobacco: Never Used  . Alcohol Use: 1.2 oz/week    2 Shots of liquor per week      Comment: patient states that she drinks liquor once a week. 04/01/13   OB History   Grav Para Term Preterm Abortions TAB SAB Ect Mult Living   2    2  2    0     Review of Systems  Constitutional: Negative for fever and chills.  HENT: Positive for dental problem and facial swelling.     Allergies  Review of patient's allergies indicates no known allergies.  Home Medications   Prior to Admission medications   Not on File   Triage Vitals: BP 120/74  Pulse 82  Temp(Src) 98.7 F (37.1 C) (Oral)  Resp 18  SpO2 100%  LMP 11/26/2013 Physical Exam  Nursing note and vitals reviewed. Constitutional: She is oriented to person, place, and time. She appears well-developed and well-nourished.  HENT:  Head: Normocephalic and atraumatic.  Tooth # 15 with significant dental decay. Tender to palpation. No obvious abscess amenable for drainage. Mild facial involvement. No TMJ.  Eyes: EOM are normal.  Neck: Normal range of motion.  Cardiovascular: Normal rate.   Pulmonary/Chest: Effort normal.  Musculoskeletal: Normal range  of motion.  Lymphadenopathy:    She has cervical adenopathy (anterior).  Neurological: She is alert and oriented to person, place, and time.  Skin: Skin is warm and dry.  Psychiatric: She has a normal mood and affect. Her behavior is normal.    ED Course  Procedures (including critical care time) DIAGNOSTIC STUDIES: Oxygen Saturation is 100% on RA, normal by my interpretation.   COORDINATION OF CARE: 12:41 PM- Will prescribe antibiotics and pain medication. Pt verbalizes understanding and agrees to plan.  Medications - No data to display  Labs Review Labs Reviewed - No data to display  Imaging Review No results found.   EKG Interpretation None      MDM   Final diagnoses:  Apical abscess    BP 120/74  Pulse 82  Temp(Src) 98.7 F (37.1 C) (Oral)  Resp 18  SpO2 100%  LMP 11/26/2013   I personally performed the services described in this  documentation, which was scribed in my presence. The recorded information has been reviewed and is accurate.    Fayrene HelperBowie Adhya Cocco, PA-C 03/14/14 1244

## 2014-03-14 NOTE — ED Provider Notes (Signed)
Medical screening examination/treatment/procedure(s) were performed by non-physician practitioner and as supervising physician I was immediately available for consultation/collaboration.   EKG Interpretation None        Asah Lamay, MD 03/14/14 1555 

## 2014-03-14 NOTE — ED Notes (Signed)
Pt c/o "swollen gums" and dental pain x 1 wk.

## 2014-03-29 ENCOUNTER — Encounter (HOSPITAL_COMMUNITY): Payer: Self-pay | Admitting: Emergency Medicine

## 2014-03-29 ENCOUNTER — Emergency Department (HOSPITAL_COMMUNITY)
Admission: EM | Admit: 2014-03-29 | Discharge: 2014-03-29 | Disposition: A | Discharge status: Home / Self Care | Payer: Self-pay | Attending: Emergency Medicine | Admitting: Emergency Medicine

## 2014-03-29 DIAGNOSIS — A5909 Other urogenital trichomoniasis: Secondary | ICD-10-CM | POA: Insufficient documentation

## 2014-03-29 DIAGNOSIS — Z792 Long term (current) use of antibiotics: Secondary | ICD-10-CM | POA: Insufficient documentation

## 2014-03-29 DIAGNOSIS — N72 Inflammatory disease of cervix uteri: Secondary | ICD-10-CM

## 2014-03-29 DIAGNOSIS — Z87891 Personal history of nicotine dependence: Secondary | ICD-10-CM | POA: Insufficient documentation

## 2014-03-29 DIAGNOSIS — Z3202 Encounter for pregnancy test, result negative: Secondary | ICD-10-CM | POA: Insufficient documentation

## 2014-03-29 LAB — URINALYSIS, ROUTINE W REFLEX MICROSCOPIC
BILIRUBIN URINE: NEGATIVE
Glucose, UA: NEGATIVE mg/dL
HGB URINE DIPSTICK: NEGATIVE
KETONES UR: NEGATIVE mg/dL
NITRITE: NEGATIVE
Protein, ur: NEGATIVE mg/dL
SPECIFIC GRAVITY, URINE: 1.022 (ref 1.005–1.030)
UROBILINOGEN UA: 1 mg/dL (ref 0.0–1.0)
pH: 7.5 (ref 5.0–8.0)

## 2014-03-29 LAB — URINE MICROSCOPIC-ADD ON

## 2014-03-29 LAB — WET PREP, GENITAL
CLUE CELLS WET PREP: NONE SEEN
Yeast Wet Prep HPF POC: NONE SEEN

## 2014-03-29 LAB — POC URINE PREG, ED: Preg Test, Ur: NEGATIVE

## 2014-03-29 MED ORDER — AZITHROMYCIN 250 MG PO TABS
1000.0000 mg | ORAL_TABLET | Freq: Once | ORAL | Status: AC
  Administered 2014-03-29: 1000 mg via ORAL
  Filled 2014-03-29: qty 4

## 2014-03-29 MED ORDER — METRONIDAZOLE 500 MG PO TABS
2000.0000 mg | ORAL_TABLET | Freq: Once | ORAL | Status: AC
  Administered 2014-03-29: 2000 mg via ORAL
  Filled 2014-03-29: qty 4

## 2014-03-29 MED ORDER — CEFTRIAXONE SODIUM 250 MG IJ SOLR
250.0000 mg | Freq: Once | INTRAMUSCULAR | Status: AC
  Administered 2014-03-29: 250 mg via INTRAMUSCULAR
  Filled 2014-03-29: qty 250

## 2014-03-29 MED ORDER — LIDOCAINE HCL 1 % IJ SOLN
INTRAMUSCULAR | Status: AC
  Administered 2014-03-29: 20 mL
  Filled 2014-03-29: qty 20

## 2014-03-29 NOTE — Discharge Instructions (Signed)
You were treated today for a sexually transmitted disease. No intercourse for 1 week. Make sure your partner is treated as well. Always use protection   Cervicitis Cervicitis is a soreness and swelling (inflammation) of the cervix. Your cervix is located at the bottom of your uterus. It opens up to the vagina. CAUSES   Sexually transmitted infections (STIs).   Allergic reaction.   Medicines or birth control devices that are put in the vagina.   Injury to the cervix.   Bacterial infections.  RISK FACTORS You are at greater risk if you:  Have unprotected sexual intercourse.  Have sexual intercourse with many partners.  Began sexual intercourse at an early age.  Have a history of STIs. SYMPTOMS  There may be no symptoms. If symptoms occur, they may include:   Grey, white, yellow, or bad-smelling vaginal discharge.   Pain or itching of the area outside the vagina.   Painful sexual intercourse.   Lower abdominal or lower back pain, especially during intercourse.   Frequent urination.   Abnormal vaginal bleeding between periods, after sexual intercourse, or after menopause.   Pressure or a heavy feeling in the pelvis.  DIAGNOSIS  Diagnosis is made after a pelvic exam. Other tests may include:   Examination of any discharge under a microscope (wet prep).   A Pap test.  TREATMENT  Treatment will depend on the cause of cervicitis. If it is caused by an STI, both you and your partner will need to be treated. Antibiotic medicines will be given.  HOME CARE INSTRUCTIONS   Do not have sexual intercourse until your health care provider says it is okay.   Do not have sexual intercourse until your partner has been treated, if your cervicitis is caused by an STI.   Take your antibiotics as directed. Finish them even if you start to feel better.  SEEK MEDICAL CARE IF:  Your symptoms come back.   You have a fever.  MAKE SURE YOU:   Understand these  instructions.  Will watch your condition.  Will get help right away if you are not doing well or get worse. Document Released: 11/13/2005 Document Revised: 07/16/2013 Document Reviewed: 05/07/2013 [facility]ExitCare Patient Information 2014 [place]Care, [place]. Trichomoniasis Trichomoniasis is an infection, caused by the Trichomonas organism, that affects both women and men. In women, the outer female genitalia and the vagina are affected. In men, the penis is mainly affected, but the prostate and other reproductive organs can also be involved. Trichomoniasis is a sexually transmitted disease (STD) and is most often passed to another person through sexual contact. The majority of people who get trichomoniasis do so from a sexual encounter and are also at risk for other STDs. CAUSES   Sexual intercourse with an infected partner.  It can be present in swimming pools or hot tubs. SYMPTOMS   Abnormal gray-green frothy vaginal discharge in women.  Vaginal itching and irritation in women.  Itching and irritation of the area outside the vagina in women.  Penile discharge with or without pain in males.  Inflammation of the urethra (urethritis), causing painful urination.  Bleeding after sexual intercourse. RELATED COMPLICATIONS  Pelvic inflammatory disease.  Infection of the uterus (endometritis).  Infertility.  Tubal (ectopic) pregnancy.  It can be associated with other STDs, including gonorrhea and chlamydia, hepatitis B, and HIV. COMPLICATIONS DURING PREGNANCY  Early (premature) delivery.  Premature rupture of the membranes (PROM).  Low birth weight. DIAGNOSIS   Visualization of Trichomonas under the microscope from the  vagina discharge.  Ph of the vagina greater than 4.5, tested with a test tape.  Trich Rapid Test.  Culture of the organism, but this is not usually needed.  It may be found on a Pap test.  Having a "strawberry cervix,"which means the cervix looks very red like a  strawberry. TREATMENT   You may be given medication to fight the infection. Inform your caregiver if you could be or are pregnant. Some medications used to treat the infection should not be taken during pregnancy.  Over-the-counter medications or creams to decrease itching or irritation may be recommended.  Your sexual partner will need to be treated if infected. HOME CARE INSTRUCTIONS   Take all medication prescribed by your caregiver.  Take over-the-counter medication for itching or irritation as directed by your caregiver.  Do not have sexual intercourse while you have the infection.  Do not douche or wear tampons.  Discuss your infection with your partner, as your partner may have acquired the infection from you. Or, your partner may have been the person who transmitted the infection to you.  Have your sex partner examined and treated if necessary.  Practice safe, informed, and protected sex.  See your caregiver for other STD testing. SEEK MEDICAL CARE IF:   You still have symptoms after you finish the medication.  You have an oral temperature above 102 F (38.9 C).  You develop belly (abdominal) pain.  You have pain when you urinate.  You have bleeding after sexual intercourse.  You develop a rash.  The medication makes you sick or makes you throw up (vomit). Document Released: 05/09/2001 Document Revised: 02/05/2012 Document Reviewed: 06/04/2009 [facility]Exit[facility] Patient Information 2014 [place]ExitCare, [place]LL[place].

## 2014-03-29 NOTE — ED Provider Notes (Signed)
CSN: [id number]     Arrival date & time 03/29/14  1911 History   First MD Initiated Contact with Patient 03/29/14 1937     Chief Complaint  Patient presents with  . Vaginal Discharge     (Consider location/radiation/quality/duration/timing/severity/associated sxs/prior Treatment) HPI Carrie Hampton is a 23 y.o. female who presents to emergency department complaining of vaginal discharge. States discharge started 1 week ago. States it is yellow. Denies any pain. States no pain with intercourse. Reports one sexual partner, does not use protection. Hx of STD. Denies abdominal pain. Denies urinary symptoms. Denies being pregnant.   Past Medical History  Diagnosis Date  . No pertinent past medical history   . STD (female) 2013    Chlamydia   Past Surgical History  Procedure Laterality Date  . No past surgeries    . Tooth extraction     Family History  Problem Relation Age of Onset  . Alcohol abuse Neg Hx   . Arthritis Neg Hx   . Asthma Neg Hx   . Birth defects Neg Hx   . Cancer Neg Hx   . COPD Neg Hx   . Depression Neg Hx   . Diabetes Neg Hx   . Drug abuse Neg Hx   . Early death Neg Hx   . Hearing loss Neg Hx   . Heart disease Neg Hx   . Hyperlipidemia Neg Hx   . Hypertension Neg Hx   . Kidney disease Neg Hx   . Learning disabilities Neg Hx   . Mental illness Neg Hx   . Mental retardation Neg Hx   . Miscarriages / Stillbirths Neg Hx   . Stroke Neg Hx   . Vision loss Neg Hx    History  Substance Use Topics  . Smoking status: Former Smoker    Quit date: 10/20/2012  . Smokeless tobacco: Never Used  . Alcohol Use: 1.2 oz/week    2 Shots of liquor per week     Comment: patient states that she drinks liquor once a week. 04/01/13   OB History   Grav Para Term Preterm Abortions TAB SAB Ect Mult Living   2    2  2    0     Review of Systems  Constitutional: Negative for fever and chills.  Respiratory: Negative for cough, chest tightness and shortness of breath.    Cardiovascular: Negative for chest pain, palpitations and leg swelling.  Gastrointestinal: Negative for nausea, vomiting, abdominal pain and diarrhea.  Genitourinary: Positive for vaginal discharge. Negative for dysuria, flank pain, vaginal bleeding, vaginal pain and pelvic pain.  Musculoskeletal: Negative for arthralgias, myalgias, neck pain and neck stiffness.  Skin: Negative for rash.  Neurological: Negative for dizziness, weakness and headaches.  All other systems reviewed and are negative.     Allergies  Review of patient's allergies indicates no known allergies.  Home Medications   Prior to Admission medications   Medication Sig Start Date End Date Taking? Authorizing Provider  HYDROcodone-acetaminophen (NORCO/VICODIN) 5-325 MG per tablet Take 1 tablet by mouth every 6 (six) hours as needed for severe pain. 03/14/14  Yes Fayrene HelperBowie Tran, PA-C  ibuprofen (ADVIL,MOTRIN) 200 MG tablet Take 800 mg by mouth every 6 (six) hours as needed for fever or moderate pain.   Yes Historical Provider, MD  penicillin v potassium (VEETID) 500 MG tablet Take 1 tablet (500 mg total) by mouth 3 (three) times daily. 03/14/14   Fayrene HelperBowie Tran, PA-C   BP 120/79  Pulse 76  Temp(Src) 98.4 F (36.9 C) (Oral)  Resp 18  SpO2 100%  LMP 03/06/2014 Physical Exam  Nursing note and vitals reviewed. Constitutional: She appears well-developed and well-nourished. No distress.  HENT:  Head: Normocephalic.  Eyes: Conjunctivae are normal.  Neck: Neck supple.  Cardiovascular: Normal rate, regular rhythm and normal heart sounds.   Pulmonary/Chest: Effort normal and breath sounds normal. No respiratory distress. She has no wheezes. She has no rales.  Abdominal: Soft. Bowel sounds are normal. She exhibits no distension. There is no tenderness. There is no rebound.  Genitourinary:  Normal external genitalia. Yellow vaginal canal. Cervix is irritated. No CMT, no uterine or adnexal tenderness  Musculoskeletal: She exhibits  no edema.  Neurological: She is alert.  Skin: Skin is warm and dry.  Psychiatric: She has a normal mood and affect. Her behavior is normal.    ED Course  Procedures (including critical care time) Labs Review Labs Reviewed  WET PREP, GENITAL - Abnormal; Notable for the following:    Trich, Wet Prep MANY (*)    WBC, Wet Prep HPF POC TOO NUMEROUS TO COUNT (*)    All other components within normal limits  URINALYSIS, ROUTINE W REFLEX MICROSCOPIC - Abnormal; Notable for the following:    APPearance CLOUDY (*)    Leukocytes, UA LARGE (*)    All other components within normal limits  URINE MICROSCOPIC-ADD ON - Abnormal; Notable for the following:    Squamous Epithelial / LPF FEW (*)    Bacteria, UA FEW (*)    All other components within normal limits  GC/CHLAMYDIA PROBE AMP  POC URINE PREG, ED    Imaging Review No results found.   EKG Interpretation None      MDM   Final diagnoses:  Cervicitis  Trichomonal cervicitis   Pt with vaginal discharge. No CMT, no abdominal pain, no uterine or adnexal tenderness.   Wet prep showing tntc WBCs and many trich. Treated in ED with rocephin 250mg  IM, zithromax 1g PO, flagyl 2 g PO. Pt has no symptoms or signs of PID. She is afebrile, non toxic appearing. UA has TNTC WBC few bacteria, however she does not have any urinary symptoms, suspect a contaminant. Will culture. Home with follow up with PCP. Discussed safe sex practice.    Filed Vitals:   03/29/14 1930 03/29/14 2124  BP: 120/79 137/80  Pulse: 76 80  Temp: 98.4 F (36.9 C) 98.3 F (36.8 C)  TempSrc: Oral Oral  Resp: 18 18  SpO2: 100% 98%       Carrie Lohr A Alydia Gosser, PA-C 03/30/14 0120

## 2014-03-29 NOTE — ED Notes (Signed)
Pt reports large amount of yellowish vaginal discharge x 1 week and dull pain and tenderness to bilateral pelvic area. Pt also c/o of frequent urge to urinate but only able to urinate a little. Denies dysuria.

## 2014-03-30 LAB — GC/CHLAMYDIA PROBE AMP
CT PROBE, AMP APTIMA: NEGATIVE
GC PROBE AMP APTIMA: POSITIVE — AB

## 2014-04-01 ENCOUNTER — Telehealth (HOSPITAL_BASED_OUTPATIENT_CLINIC_OR_DEPARTMENT_OTHER): Payer: Self-pay | Admitting: Emergency Medicine

## 2014-04-01 NOTE — Telephone Encounter (Signed)
+  Gonorrhea. Patient treated with Rocephin and Zithromax. DHHS faxed. 

## 2014-04-01 NOTE — ED Provider Notes (Signed)
Medical screening examination/treatment/procedure(s) were performed by non-physician practitioner and as supervising physician I was immediately available for consultation/collaboration.    Keston Seever R Jakala Herford, MD 04/01/14 1515 

## 2014-04-07 ENCOUNTER — Telehealth (HOSPITAL_BASED_OUTPATIENT_CLINIC_OR_DEPARTMENT_OTHER): Payer: Self-pay

## 2014-04-07 NOTE — Telephone Encounter (Signed)
ID verified x 2.  Pt informed of Dx, tx rcvd appropriate, notify partners and abstain from sex x 2 weeks.

## 2014-05-13 ENCOUNTER — Encounter: Payer: Self-pay | Admitting: Obstetrics and Gynecology

## 2014-09-23 ENCOUNTER — Encounter (HOSPITAL_COMMUNITY): Payer: Self-pay | Admitting: Emergency Medicine

## 2014-09-23 ENCOUNTER — Emergency Department (INDEPENDENT_AMBULATORY_CARE_PROVIDER_SITE_OTHER)
Admission: EM | Admit: 2014-09-23 | Discharge: 2014-09-23 | Disposition: A | Discharge status: Home / Self Care | Payer: Self-pay | Source: Home / Self Care

## 2014-09-23 ENCOUNTER — Other Ambulatory Visit (HOSPITAL_COMMUNITY)
Admission: RE | Admit: 2014-09-23 | Discharge: 2014-09-23 | Disposition: A | Discharge status: Home / Self Care | Payer: Self-pay | Source: Ambulatory Visit | Attending: Emergency Medicine | Admitting: Emergency Medicine

## 2014-09-23 DIAGNOSIS — N760 Acute vaginitis: Secondary | ICD-10-CM | POA: Insufficient documentation

## 2014-09-23 DIAGNOSIS — Z113 Encounter for screening for infections with a predominantly sexual mode of transmission: Secondary | ICD-10-CM | POA: Insufficient documentation

## 2014-09-23 DIAGNOSIS — N898 Other specified noninflammatory disorders of vagina: Secondary | ICD-10-CM

## 2014-09-23 DIAGNOSIS — N72 Inflammatory disease of cervix uteri: Secondary | ICD-10-CM

## 2014-09-23 LAB — POCT URINALYSIS DIP (DEVICE)
BILIRUBIN URINE: NEGATIVE
Glucose, UA: NEGATIVE mg/dL
HGB URINE DIPSTICK: NEGATIVE
KETONES UR: NEGATIVE mg/dL
NITRITE: NEGATIVE
Protein, ur: 30 mg/dL — AB
Specific Gravity, Urine: 1.02 (ref 1.005–1.030)
Urobilinogen, UA: 2 mg/dL — ABNORMAL HIGH (ref 0.0–1.0)
pH: 7 (ref 5.0–8.0)

## 2014-09-23 LAB — POCT PREGNANCY, URINE: Preg Test, Ur: NEGATIVE

## 2014-09-23 MED ORDER — CEFTRIAXONE SODIUM 250 MG IJ SOLR
250.0000 mg | Freq: Once | INTRAMUSCULAR | Status: AC
  Administered 2014-09-23: 250 mg via INTRAMUSCULAR

## 2014-09-23 MED ORDER — CEFTRIAXONE SODIUM 250 MG IJ SOLR
INTRAMUSCULAR | Status: AC
  Filled 2014-09-23: qty 250

## 2014-09-23 MED ORDER — METRONIDAZOLE 500 MG PO TABS: 500.0000 mg | ORAL_TABLET | Freq: Two times a day (BID) | ORAL | Status: DC

## 2014-09-23 MED ORDER — AZITHROMYCIN 250 MG PO TABS: ORAL_TABLET | ORAL | Status: DC

## 2014-09-23 MED ORDER — LIDOCAINE HCL (PF) 1 % IJ SOLN
INTRAMUSCULAR | Status: AC
  Filled 2014-09-23: qty 30

## 2014-09-23 NOTE — Discharge Instructions (Signed)
Cervicitis Cervicitis is a soreness and swelling (inflammation) of the cervix. Your cervix is located at the bottom of your uterus. It opens up to the vagina. CAUSES   Sexually transmitted infections (STIs).   Allergic reaction.   Medicines or birth control devices that are put in the vagina.   Injury to the cervix.   Bacterial infections.  RISK FACTORS You are at greater risk if you:  Have unprotected sexual intercourse.  Have sexual intercourse with many partners.  Began sexual intercourse at an early age.  Have a history of STIs. SYMPTOMS  There may be no symptoms. If symptoms occur, they may include:   Gray, white, yellow, or bad-smelling vaginal discharge.   Pain or itching of the area outside the vagina.   Painful sexual intercourse.   Lower abdominal or lower back pain, especially during intercourse.   Frequent urination.   Abnormal vaginal bleeding between periods, after sexual intercourse, or after menopause.   Pressure or a heavy feeling in the pelvis.  DIAGNOSIS  Diagnosis is made after a pelvic exam. Other tests may include:   Examination of any discharge under a microscope (wet prep).   A Pap test.  TREATMENT  Treatment will depend on the cause of cervicitis. If it is caused by an STI, both you and your partner will need to be treated. Antibiotic medicines will be given.  HOME CARE INSTRUCTIONS   Do not have sexual intercourse until your health care provider says it is okay.   Do not have sexual intercourse until your partner has been treated, if your cervicitis is caused by an STI.   Take your antibiotics as directed. Finish them even if you start to feel better.  SEEK MEDICAL CARE IF:  Your symptoms come back.   You have a fever.  MAKE SURE YOU:   Understand these instructions.  Will watch your condition.  Will get help right away if you are not doing well or get worse. Document Released: 11/13/2005 Document Revised:  11/18/2013 Document Reviewed: 05/07/2013 [facility] Patient Information 2015 [place], [place]. This information is not intended to replace advice given to you by your health care provider. Make sure you discuss any questions you have with your health care provider.  Sexually Transmitted Disease A sexually transmitted disease (STD) is a disease or infection that may be passed (transmitted) from person to person, usually during sexual activity. This may happen by way of saliva, semen, blood, vaginal mucus, or urine. Common STDs include:   Gonorrhea.   Chlamydia.   Syphilis.   HIV and AIDS.   Genital herpes.   Hepatitis B and C.   Trichomonas.   Human papillomavirus (HPV).   Pubic lice.   Scabies.  Mites.  Bacterial vaginosis. WHAT ARE CAUSES OF STDs? An STD may be caused by bacteria, a virus, or parasites. STDs are often transmitted during sexual activity if one person is infected. However, they may also be transmitted through nonsexual means. STDs may be transmitted after:   Sexual intercourse with an infected person.   Sharing sex toys with an infected person.   Sharing needles with an infected person or using unclean piercing or tattoo needles.  Having intimate contact with the genitals, mouth, or rectal areas of an infected person.   Exposure to infected fluids during birth. WHAT ARE THE SIGNS AND SYMPTOMS OF STDs? Different STDs have different symptoms. Some people may not have any symptoms. If symptoms are present, they may include:   Painful or bloody urination.  Pain in the pelvis, abdomen, vagina, anus, throat, or eyes.   A skin rash, itching, or irritation.  Growths, ulcerations, blisters, or sores in the genital and anal areas.  Abnormal vaginal discharge with or without bad odor.   Penile discharge in men.   Fever.   Pain or bleeding during sexual intercourse.   Swollen glands in the groin area.   Yellow skin and eyes  (jaundice). This is seen with hepatitis.   Swollen testicles.  Infertility.  Sores and blisters in the mouth. HOW ARE STDs DIAGNOSED? To make a diagnosis, your health care provider may:   Take a medical history.   Perform a physical exam.   Take a sample of any discharge to examine.  Swab the throat, cervix, opening to the penis, rectum, or vagina for testing.  Test a sample of your first morning urine.   Perform blood tests.   Perform a Pap test, if this applies.   Perform a colposcopy.   Perform a laparoscopy.  HOW ARE STDs TREATED? Treatment depends on the STD. Some STDs may be treated but not cured.   Chlamydia, gonorrhea, trichomonas, and syphilis can be cured with antibiotic medicine.   Genital herpes, hepatitis, and HIV can be treated, but not cured, with prescribed medicines. The medicines lessen symptoms.   Genital warts from HPV can be treated with medicine or by freezing, burning (electrocautery), or surgery. Warts may come back.   HPV cannot be cured with medicine or surgery. However, abnormal areas may be removed from the cervix, vagina, or vulva.   If your diagnosis is confirmed, your recent sexual partners need treatment. This is true even if they are symptom-free or have a negative culture or evaluation. They should not have sex until their health care providers say it is okay. HOW CAN I REDUCE MY RISK OF GETTING AN STD? Take these steps to reduce your risk of getting an STD:  Use latex condoms, dental dams, and water-soluble lubricants during sexual activity. Do not use petroleum jelly or oils.  Avoid having multiple sex partners.  Do not have sex with someone who has other sex partners.  Do not have sex with anyone you do not know or who is at high risk for an STD.  Avoid risky sex practices that can break your skin.  Do not have sex if you have open sores on your mouth or skin.  Avoid drinking too much alcohol or taking illegal  drugs. Alcohol and drugs can affect your judgment and put you in a vulnerable position.  Avoid engaging in oral and anal sex acts.  Get vaccinated for HPV and hepatitis. If you have not received these vaccines in the past, talk to your health care provider about whether one or both might be right for you.   If you are at risk of being infected with HIV, it is recommended that you take a prescription medicine daily to prevent HIV infection. This is called pre-exposure prophylaxis (PrEP). You are considered at risk if:  You are a man who has sex with other men (MSM).  You are a heterosexual man or woman and are sexually active with more than one partner.  You take drugs by injection.  You are sexually active with a partner who has HIV.  Talk with your health care provider about whether you are at high risk of being infected with HIV. If you choose to begin PrEP, you should first be tested for HIV. You should then be tested  every 3 months for as long as you are taking PrEP.  WHAT SHOULD I DO IF I THINK I HAVE AN STD?  See your health care provider.   Tell your sexual partner(s). They should be tested and treated for any STDs.  Do not have sex until your health care provider says it is okay. WHEN SHOULD I GET IMMEDIATE MEDICAL CARE? Contact your health care provider right away if:   You have severe abdominal pain.  You are a man and notice swelling or pain in your testicles.  You are a woman and notice swelling or pain in your vagina. Document Released: 02/03/2003 Document Revised: 11/18/2013 Document Reviewed: 06/03/2013 [facility] Patient Information 2015 [place]. This information is not intended to replace advice given to you by your health care provider. Make sure you discuss any questions you have with your health care provider.  Pelvic Inflammatory Disease Pelvic inflammatory disease (PID) refers to an infection in some or all of the female organs. The infection can be  in the uterus, ovaries, fallopian tubes, or the surrounding tissues in the pelvis. PID can cause abdominal or pelvic pain that comes on suddenly (acute pelvic pain). PID is a serious infection because it can lead to lasting (chronic) pelvic pain or the inability to have children (infertile).  CAUSES  The infection is often caused by the normal bacteria found in the vaginal tissues. PID may also be caused by an infection that is spread during sexual contact. PID can also occur following:   The birth of a baby.   A miscarriage.   An abortion.   Major pelvic surgery.   The use of an intrauterine device (IUD).   A sexual assault.  RISK FACTORS Certain factors can put a person at higher risk for PID, such as:  Being younger than 25 years.  Being sexually active at [place] age.  Usingnonbarrier contraception.  Havingmultiple sexual partners.  Having sex with someone who has symptoms of a genital infection.  Using oral contraception. Other times, certain behaviors can increase the possibility of getting PID, such as:  Having sex during your period.  Using a vaginal douche.  Having an intrauterine device (IUD) in place. SYMPTOMS   Abdominal or pelvic pain.   Fever.   Chills.   Abnormal vaginal discharge.  Abnormal uterine bleeding.   Unusual pain shortly after finishing your period. DIAGNOSIS  Your caregiver will choose some of the following methods to make a diagnosis, such as:   Performinga physical exam and history. A pelvic exam typically reveals a very tender uterus and surrounding pelvis.   Ordering laboratory tests including a pregnancy test, blood tests, and urine test.  Orderingcultures of the vagina and cervix to check for a sexually transmitted infection (STI).  Performing an ultrasound.   Performing a laparoscopic procedure to look inside the pelvis.  TREATMENT   Antibiotic medicines may be prescribed and taken by mouth.   Sexual  partners may be treated when the infection is caused by a sexually transmitted disease (STD).   Hospitalization may be needed to give antibiotics intravenously.  Surgery may be needed, but this is rare. It may take weeks until you are completely well. If you are diagnosed with PID, you should also be checked for human immunodeficiency virus (HIV). HOME CARE INSTRUCTIONS   If given, take your antibiotics as directed. Finish the medicine even if you start to feel better.   Only take over-the-counter or prescription medicines for pain, discomfort, or fever as  directed by your caregiver.   Do not have sexual intercourse until treatment is completed or as directed by your caregiver. If PID is confirmed, your recent sexual partner(s) will need treatment.   Keep your follow-up appointments. SEEK MEDICAL CARE IF:   You have increased or abnormal vaginal discharge.   You need prescription medicine for your pain.   You vomit.   You cannot take your medicines.   Your partner has an STD.  SEEK IMMEDIATE MEDICAL CARE IF:   You have a fever.   You have increased abdominal or pelvic pain.   You have chills.   You have pain when you urinate.   You are not better after 72 hours following treatment.  MAKE SURE YOU:   Understand these instructions.  Will watch your condition.  Will get help right away if you are not doing well or get worse. Document Released: 11/13/2005 Document Revised: 03/10/2013 Document Reviewed: 11/09/2011 [facility] Patient Information 2015 [place], [place]. This information is not intended to replace advice given to you by your health care provider. Make sure you discuss any questions you have with your health care provider.

## 2014-09-23 NOTE — ED Provider Notes (Signed)
CSN: [id number]     Arrival date & time 09/23/14  1245 History   First MD Initiated Contact with Patient 09/23/14 1404     Chief Complaint  Patient presents with  . Vaginal Discharge   (Consider location/radiation/quality/duration/timing/severity/associated sxs/prior Treatment) HPI Comments: 23 year old female is complaining of a vaginal discharge for 2 weeks. It is associated with mild pelvic discomfort. Denies abdominal pain, nausea, vomiting, fever or chills. She is sexually active. It is noted that sexual partner is also here for penile discharge.  Patient is a 23 y.o. female presenting with vaginal discharge.  Vaginal Discharge Associated symptoms: no dysuria     Past Medical History  Diagnosis Date  . No pertinent past medical history   . STD (female) 2013    Chlamydia   Past Surgical History  Procedure Laterality Date  . No past surgeries    . Tooth extraction     Family History  Problem Relation Age of Onset  . Alcohol abuse Neg Hx   . Arthritis Neg Hx   . Asthma Neg Hx   . Birth defects Neg Hx   . Cancer Neg Hx   . COPD Neg Hx   . Depression Neg Hx   . Diabetes Neg Hx   . Drug abuse Neg Hx   . Early death Neg Hx   . Hearing loss Neg Hx   . Heart disease Neg Hx   . Hyperlipidemia Neg Hx   . Hypertension Neg Hx   . Kidney disease Neg Hx   . Learning disabilities Neg Hx   . Mental illness Neg Hx   . Mental retardation Neg Hx   . Miscarriages / Stillbirths Neg Hx   . Stroke Neg Hx   . Vision loss Neg Hx    History  Substance Use Topics  . Smoking status: Former Smoker    Quit date: 10/20/2012  . Smokeless tobacco: Never Used  . Alcohol Use: 1.2 oz/week    2 Shots of liquor per week     Comment: patient states that she drinks liquor once a week. 04/01/13   OB History   Grav Para Term Preterm Abortions TAB SAB Ect Mult Living   2    2  2    0     Review of Systems  Constitutional: Negative.   Respiratory: Negative for cough and shortness of breath.    Cardiovascular: Negative for chest pain.  Gastrointestinal: Negative.   Genitourinary: Positive for vaginal discharge and pelvic pain. Negative for dysuria, urgency, hematuria, flank pain and vaginal bleeding.  Skin: Negative for rash.  Neurological: Negative for dizziness and light-headedness.    Allergies  Review of patient's allergies indicates no known allergies.  Home Medications   Prior to Admission medications   Medication Sig Start Date End Date Taking? Authorizing Provider  azithromycin (ZITHROMAX) 250 MG tablet Take all 4 tablets po now 09/23/14   Hayden Rasmussenavid Margarie Mcguirt, NP  HYDROcodone-acetaminophen (NORCO/VICODIN) 5-325 MG per tablet Take 1 tablet by mouth every 6 (six) hours as needed for severe pain. 03/14/14   Fayrene HelperBowie Tran, PA-C  ibuprofen (ADVIL,MOTRIN) 200 MG tablet Take 800 mg by mouth every 6 (six) hours as needed for fever or moderate pain.    Historical Provider, MD  metroNIDAZOLE (FLAGYL) 500 MG tablet Take 1 tablet (500 mg total) by mouth 2 (two) times daily. X 7 days 09/23/14   Hayden Rasmussenavid Ajah Vanhoose, NP  penicillin v potassium (VEETID) 500 MG tablet Take 1 tablet (500 mg total) by mouth 3 (  three) times daily. 03/14/14   Fayrene HelperBowie Tran, PA-C   BP 112/80  Pulse 83  Temp(Src) 98.1 F (36.7 C) (Oral)  Resp 16  SpO2 100%  LMP 08/26/2014 Physical Exam  Nursing note and vitals reviewed. Constitutional: She is oriented to person, place, and time. She appears well-developed and well-nourished. No distress.  Pulmonary/Chest: Effort normal. No respiratory distress.  Genitourinary: Vaginal discharge found.  NEFG White ,medium thick vaginal discharge exuding from the vagina and collected in the vaginal vault. Cx left of midline. Ectocervix pale, pinpoint papules involving the ectocx. Os nonparous, friable. Bimanual with CMT. No adnexal tenderness.  Musculoskeletal: She exhibits no edema.  Neurological: She is alert and oriented to person, place, and time. She exhibits normal muscle tone.  Skin:  Skin is warm and dry.  Psychiatric: She has a normal mood and affect.    ED Course  Procedures (including critical care time) Labs Review Labs Reviewed  POCT URINALYSIS DIP (DEVICE) - Abnormal; Notable for the following:    Protein, ur 30 (*)    Urobilinogen, UA 2.0 (*)    Leukocytes, UA SMALL (*)    All other components within normal limits  POCT PREGNANCY, URINE  CERVICOVAGINAL ANCILLARY ONLY    Imaging Review No results found.   MDM   1. Vaginal discharge   2. Cervicitis    Azithromycin 1 gm po per Rx Flagyl bid Rocephin 250 mg IM now Cytology pending    Hayden Rasmussenavid Shay Jhaveri, NP 09/23/14 1452

## 2014-09-23 NOTE — ED Notes (Signed)
Pt    Reports       Symptoms  Of   Vaginal  Discharge   X  2  Weeks          Pt      Sitting  Upright on   Exam table  Speaking in   Complete   sentances   And  Is  In no  severe  Distress

## 2014-09-24 LAB — CERVICOVAGINAL ANCILLARY ONLY
Chlamydia: POSITIVE — AB
Neisseria Gonorrhea: POSITIVE — AB

## 2014-09-24 NOTE — ED Provider Notes (Signed)
Medical screening examination/treatment/procedure(s) were performed by non-physician practitioner and as supervising physician I was immediately available for consultation/collaboration.  Leslee Homeavid Dermot Gremillion, M.D.  Reuben Likesavid C Juliocesar Blasius, MD 09/24/14 [phone]

## 2014-09-25 LAB — CERVICOVAGINAL ANCILLARY ONLY
WET PREP (BD AFFIRM): NEGATIVE
WET PREP (BD AFFIRM): NEGATIVE
Wet Prep (BD Affirm): NEGATIVE

## 2014-09-28 ENCOUNTER — Encounter (HOSPITAL_COMMUNITY): Payer: Self-pay | Admitting: Emergency Medicine

## 2014-09-28 ENCOUNTER — Telehealth (HOSPITAL_COMMUNITY): Payer: Self-pay | Admitting: *Deleted

## 2014-09-28 NOTE — ED Notes (Signed)
GC and Chlamydia pos., Affirm: Candida, Gardnerella and Trich neg.  Pt. adequately treated with Rocephin and a Rx. of Zithromax. I called pt. and left a message to call.  Call 1. [facility]HS forms x 2 [facility] Vassie Moselle[place], Carrie Hampton 09/28/2014 Pt. called back.  Pt. verified x 2 and given results. Pt. told she was adequately treated for both. She said she has not gotten the Rx. filled yet. I told her, she is not completely treated until she takes that medication and no sex for 1 week after she takes that medication.  Pt. instructed to notify her partner, and to practice safe sex. Pt. [facility], by appointment. Pt. voiced understanding. 09/28/2014

## 2015-06-15 ENCOUNTER — Inpatient Hospital Stay (HOSPITAL_COMMUNITY)
Admission: AD | Admit: 2015-06-15 | Discharge: 2015-06-16 | Disposition: A | Discharge status: Home / Self Care | Payer: Self-pay | Source: Ambulatory Visit | Attending: Family Medicine | Admitting: Family Medicine

## 2015-06-15 ENCOUNTER — Encounter (HOSPITAL_COMMUNITY): Payer: Self-pay | Admitting: *Deleted

## 2015-06-15 DIAGNOSIS — R197 Diarrhea, unspecified: Secondary | ICD-10-CM | POA: Insufficient documentation

## 2015-06-15 DIAGNOSIS — N926 Irregular menstruation, unspecified: Secondary | ICD-10-CM | POA: Insufficient documentation

## 2015-06-15 DIAGNOSIS — Z87891 Personal history of nicotine dependence: Secondary | ICD-10-CM | POA: Insufficient documentation

## 2015-06-15 LAB — URINALYSIS, ROUTINE W REFLEX MICROSCOPIC
BILIRUBIN URINE: NEGATIVE
Glucose, UA: NEGATIVE mg/dL
Hgb urine dipstick: NEGATIVE
KETONES UR: NEGATIVE mg/dL
Leukocytes, UA: NEGATIVE
NITRITE: NEGATIVE
PH: 5.5 (ref 5.0–8.0)
Protein, ur: NEGATIVE mg/dL
UROBILINOGEN UA: 0.2 mg/dL (ref 0.0–1.0)

## 2015-06-15 LAB — POCT PREGNANCY, URINE: Preg Test, Ur: NEGATIVE

## 2015-06-15 NOTE — MAU Note (Signed)
Pt states spotting started this morning. States discharge with an odor. Denies pain. LMP 05/25/2015. Is currently sexually active and denies birth control. Last had intercourse 3 days ago.

## 2015-06-15 NOTE — MAU Note (Addendum)
Pt reports having irregular periods, and just came off of her period 2 weeks ago. She noticed white discharge last week that has an odor, denies burning, itching, or pain. She came in tonight bc this morning she was spotting, and has done so all day. 0 pain. Currently has a partner, and has intercourse. No birth control used.

## 2015-06-15 NOTE — MAU Provider Note (Signed)
History     CSN: [id number]  Arrival date and time: 06/15/15 2254   First Provider Initiated Contact with Patient 06/15/15 2350      Chief Complaint  Patient presents with  . Vaginal Discharge   HPI  Carrie Hampton is a 24 y.o. G2P0020 who presents to MAU today with complaint of vaginal discharge. She states that it is white, thin and has a foul odor. She has had discharge x 1 week and also noted spotting today. She denies abdominal pain, fever or UTI symptoms. She has has some nausea and loose stools, but denies vomiting. She is sexually active and uses condoms sometimes. She is not on anything for birth control.   OB History    Gravida Para Term Preterm AB TAB SAB Ectopic Multiple Living   2    2  2    0      Past Medical History  Diagnosis Date  . No pertinent past medical history   . STD (female) 2013    Chlamydia    Past Surgical History  Procedure Laterality Date  . No past surgeries    . Tooth extraction      Family History  Problem Relation Age of Onset  . Alcohol abuse Neg Hx   . Arthritis Neg Hx   . Asthma Neg Hx   . Birth defects Neg Hx   . Cancer Neg Hx   . COPD Neg Hx   . Depression Neg Hx   . Diabetes Neg Hx   . Drug abuse Neg Hx   . Early death Neg Hx   . Hearing loss Neg Hx   . Heart disease Neg Hx   . Hyperlipidemia Neg Hx   . Hypertension Neg Hx   . Kidney disease Neg Hx   . Learning disabilities Neg Hx   . Mental illness Neg Hx   . Mental retardation Neg Hx   . Miscarriages / Stillbirths Neg Hx   . Stroke Neg Hx   . Vision loss Neg Hx     History  Substance Use Topics  . Smoking status: Former Smoker    Quit date: 10/20/2012  . Smokeless tobacco: Never Used  . Alcohol Use: 1.2 oz/week    2 Shots of liquor per week     Comment: patient states that she drinks liquor once a week. 04/01/13    Allergies: No Known Allergies  No prescriptions prior to admission    Review of Systems  Constitutional: Negative for fever and  malaise/fatigue.  Gastrointestinal: Positive for nausea and diarrhea. Negative for vomiting, abdominal pain and constipation.  Genitourinary: Negative for dysuria, urgency and frequency.       + vaginal discharge, bleeding   Physical Exam   Blood pressure 108/63, pulse 75, temperature 97.9 F (36.6 C), temperature source Oral, resp. rate 18, height 5' 2.5" (1.588 m), weight 107 lb 12.8 oz (48.898 kg), last menstrual period 05/25/2015, SpO2 100 %, unknown if currently breastfeeding.  Physical Exam  Nursing note and vitals reviewed. Constitutional: She is oriented to person, place, and time. She appears well-developed and well-nourished. No distress.  HENT:  Head: Normocephalic and atraumatic.  Cardiovascular: Normal rate.   Respiratory: Effort normal.  GI: Soft. She exhibits no distension and no mass. There is no tenderness. There is no rebound and no guarding.  Genitourinary: Uterus is not enlarged and not tender. Cervix exhibits no motion tenderness, no discharge and no friability. Right adnexum displays no mass and no tenderness.  Left adnexum displays no mass and no tenderness. There is bleeding (scant blood) in the vagina. Vaginal discharge (small amount of thin, white discharge noted) found.  Neurological: She is alert and oriented to person, place, and time.  Skin: Skin is warm and dry. No erythema.  Psychiatric: She has a normal mood and affect.   Results for orders placed or performed during the hospital encounter of 06/15/15 (from the past 24 hour(s))  Urinalysis, Routine w reflex microscopic (not at [facility])     Status: Abnormal   Collection Time: 06/15/15 11:16 PM  Result Value Ref Range   Color, Urine YELLOW YELLOW   APPearance CLEAR CLEAR   Specific Gravity, Urine >1.030 (H) 1.005 - 1.030   pH 5.5 5.0 - 8.0   Glucose, UA NEGATIVE NEGATIVE mg/dL   Hgb urine dipstick NEGATIVE NEGATIVE   Bilirubin Urine NEGATIVE NEGATIVE   Ketones, ur NEGATIVE NEGATIVE mg/dL   Protein, ur  NEGATIVE NEGATIVE mg/dL   Urobilinogen, UA 0.2 0.0 - 1.0 mg/dL   Nitrite NEGATIVE NEGATIVE   Leukocytes, UA NEGATIVE NEGATIVE  Pregnancy, urine POC     Status: None   Collection Time: 06/15/15 11:24 PM  Result Value Ref Range   Preg Test, Ur NEGATIVE NEGATIVE  Wet prep, genital     Status: None   Collection Time: 06/16/15 12:01 AM  Result Value Ref Range   Yeast Wet Prep HPF POC NONE SEEN NONE SEEN   Trich, Wet Prep NONE SEEN NONE SEEN   Clue Cells Wet Prep HPF POC NONE SEEN NONE SEEN   WBC, Wet Prep HPF POC NONE SEEN NONE SEEN    MAU Course  Procedures None  MDM UPT - negative UA, wet prep, GC/chlamydia, RPR and HIV  Assessment and Plan  A: Loose stools Irregular bleeding  P: Discharge home Recommended ibuprofen PRN pain and imodium for loose stools Warning signs for worsening condition discussed Patient advised to follow-up with [facility] for further eval of irregular periods. Contact information given Patient may return to MAU as needed or if her condition were to change or worsen  Marny Lowenstein, PA-C  06/16/2015, 1:34 AM

## 2015-06-16 DIAGNOSIS — N926 Irregular menstruation, unspecified: Secondary | ICD-10-CM

## 2015-06-16 LAB — WET PREP, GENITAL
CLUE CELLS WET PREP: NONE SEEN
TRICH WET PREP: NONE SEEN
WBC WET PREP: NONE SEEN
YEAST WET PREP: NONE SEEN

## 2015-06-16 LAB — RPR: RPR: NONREACTIVE

## 2015-06-16 LAB — HIV ANTIBODY (ROUTINE TESTING W REFLEX): HIV SCREEN 4TH GENERATION: NONREACTIVE

## 2015-06-16 NOTE — Discharge Instructions (Signed)

## 2015-06-17 LAB — GC/CHLAMYDIA PROBE AMP (~~LOC~~) NOT AT ARMC
Chlamydia: NEGATIVE
Neisseria Gonorrhea: NEGATIVE

## 2015-06-20 ENCOUNTER — Emergency Department (HOSPITAL_COMMUNITY)
Admission: EM | Admit: 2015-06-20 | Discharge: 2015-06-20 | Discharge status: Against Medical Advice | Payer: Self-pay | Source: Home / Self Care | Attending: Family Medicine | Admitting: Family Medicine

## 2015-06-20 ENCOUNTER — Emergency Department (HOSPITAL_COMMUNITY)
Admission: EM | Admit: 2015-06-20 | Discharge: 2015-06-20 | Discharge status: Absent without leave | Payer: Self-pay | Source: Home / Self Care

## 2015-06-20 NOTE — ED Notes (Signed)
No answer in waiting room 

## 2015-06-20 NOTE — ED Notes (Signed)
No answer in lobby x 2.

## 2015-07-12 ENCOUNTER — Inpatient Hospital Stay (HOSPITAL_COMMUNITY)
Admission: AD | Admit: 2015-07-12 | Discharge: 2015-07-13 | Disposition: A | Discharge status: Home / Self Care | Payer: Self-pay | Source: Ambulatory Visit | Attending: Family Medicine | Admitting: Family Medicine

## 2015-07-12 ENCOUNTER — Inpatient Hospital Stay (HOSPITAL_COMMUNITY): Payer: Self-pay

## 2015-07-12 ENCOUNTER — Encounter (HOSPITAL_COMMUNITY): Payer: Self-pay | Admitting: *Deleted

## 2015-07-12 DIAGNOSIS — R1030 Lower abdominal pain, unspecified: Secondary | ICD-10-CM | POA: Insufficient documentation

## 2015-07-12 DIAGNOSIS — Z8619 Personal history of other infectious and parasitic diseases: Secondary | ICD-10-CM | POA: Insufficient documentation

## 2015-07-12 DIAGNOSIS — Z87891 Personal history of nicotine dependence: Secondary | ICD-10-CM | POA: Insufficient documentation

## 2015-07-12 DIAGNOSIS — O418X10 Other specified disorders of amniotic fluid and membranes, first trimester, not applicable or unspecified: Secondary | ICD-10-CM

## 2015-07-12 DIAGNOSIS — O468X1 Other antepartum hemorrhage, first trimester: Secondary | ICD-10-CM

## 2015-07-12 DIAGNOSIS — O9989 Other specified diseases and conditions complicating pregnancy, childbirth and the puerperium: Secondary | ICD-10-CM | POA: Insufficient documentation

## 2015-07-12 DIAGNOSIS — O26899 Other specified pregnancy related conditions, unspecified trimester: Secondary | ICD-10-CM

## 2015-07-12 DIAGNOSIS — Z3A01 Less than 8 weeks gestation of pregnancy: Secondary | ICD-10-CM | POA: Insufficient documentation

## 2015-07-12 DIAGNOSIS — R197 Diarrhea, unspecified: Secondary | ICD-10-CM | POA: Insufficient documentation

## 2015-07-12 DIAGNOSIS — R109 Unspecified abdominal pain: Secondary | ICD-10-CM

## 2015-07-12 DIAGNOSIS — R195 Other fecal abnormalities: Secondary | ICD-10-CM

## 2015-07-12 LAB — POCT PREGNANCY, URINE: Preg Test, Ur: POSITIVE — AB

## 2015-07-12 LAB — URINE MICROSCOPIC-ADD ON

## 2015-07-12 LAB — URINALYSIS, ROUTINE W REFLEX MICROSCOPIC
Bilirubin Urine: NEGATIVE
Glucose, UA: NEGATIVE mg/dL
HGB URINE DIPSTICK: NEGATIVE
Ketones, ur: NEGATIVE mg/dL
Nitrite: NEGATIVE
PH: 6.5 (ref 5.0–8.0)
Protein, ur: NEGATIVE mg/dL
SPECIFIC GRAVITY, URINE: 1.025 (ref 1.005–1.030)
Urobilinogen, UA: 4 mg/dL — ABNORMAL HIGH (ref 0.0–1.0)

## 2015-07-12 LAB — CBC
HCT: 39.5 % (ref 36.0–46.0)
HEMOGLOBIN: 13.9 g/dL (ref 12.0–15.0)
MCH: 29.7 pg (ref 26.0–34.0)
MCHC: 35.2 g/dL (ref 30.0–36.0)
MCV: 84.4 fL (ref 78.0–100.0)
PLATELETS: 212 10*3/uL (ref 150–400)
RBC: 4.68 MIL/uL (ref 3.87–5.11)
RDW: 12.2 % (ref 11.5–15.5)
WBC: 5.5 10*3/uL (ref 4.0–10.5)

## 2015-07-12 NOTE — MAU Note (Signed)
Pt reports she has been having lower abd pain x one week. LMP 05/28/2015, had a negative preg test at the beginning of August.

## 2015-07-13 DIAGNOSIS — O468X1 Other antepartum hemorrhage, first trimester: Secondary | ICD-10-CM

## 2015-07-13 LAB — HCG, QUANTITATIVE, PREGNANCY: HCG, BETA CHAIN, QUANT, S: 23032 m[IU]/mL — AB (ref <5)

## 2015-07-13 MED ORDER — CONCEPT OB 130-92.4-1 MG PO CAPS: 1.0000 | ORAL_CAPSULE | Freq: Every day | ORAL | Status: DC

## 2015-07-13 NOTE — MAU Provider Note (Signed)
Chief Complaint:  No chief complaint on file.   First Provider Initiated Contact with Patient 07/12/15 2319     HPI: Carrie Hampton is a 24 y.o. G3P0020 at [redacted]w[redacted]d who presents to maternity admissions reporting possible pregnancy, and low abd cramping x one week. LMP 05/28/15. Negative UPT early August. Irregular cycles. Negative GC/chlamydia cultures June 2016. Negative wet prep July 2016.  Location: Suprapubic Quality: Cramping, sometimes sharp Severity: 8/10 in pain scale at worst. 5/10 now. Duration: Intermittent 1 week Context: None Timing: Random, sporadic Modifying factors: None Signs and symptoms: Loose stools once, sometimes twice per day. Feeling of needing to defecate, but not always passing stool. Negative for vaginal bleeding or vaginal discharge.  Blood type O positive  Past Medical History: Past Medical History  Diagnosis Date  . No pertinent past medical history   . STD (female) 2013    Chlamydia    Past obstetric history: OB History  Gravida Para Term Preterm AB SAB TAB Ectopic Multiple Living  3    2 2     0    # Outcome Date GA Lbr Len/2nd Weight Sex Delivery Anes PTL Lv  3 Current           2 SAB 2014          1 SAB 2013             Comments: System Generated. Please review and update pregnancy details.      Past Surgical History: Past Surgical History  Procedure Laterality Date  . No past surgeries    . Tooth extraction       Family History: Family History  Problem Relation Age of Onset  . Alcohol abuse Neg Hx   . Arthritis Neg Hx   . Asthma Neg Hx   . Birth defects Neg Hx   . Cancer Neg Hx   . COPD Neg Hx   . Depression Neg Hx   . Diabetes Neg Hx   . Drug abuse Neg Hx   . Early death Neg Hx   . Hearing loss Neg Hx   . Heart disease Neg Hx   . Hyperlipidemia Neg Hx   . Hypertension Neg Hx   . Kidney disease Neg Hx   . Learning disabilities Neg Hx   . Mental illness Neg Hx   . Mental retardation Neg Hx   . Miscarriages / Stillbirths  Neg Hx   . Stroke Neg Hx   . Vision loss Neg Hx     Social History: Social History  Substance Use Topics  . Smoking status: Former Smoker    Quit date: 10/20/2012  . Smokeless tobacco: Never Used  . Alcohol Use: 1.2 oz/week    2 Shots of liquor per week     Comment: patient states that she drinks liquor once a week. 04/01/13    Allergies: No Known Allergies  Meds:  Prescriptions prior to admission  Medication Sig Dispense Refill Last Dose  . azithromycin (ZITHROMAX) 250 MG tablet Take all 4 tablets po now 6 each 0   . HYDROcodone-acetaminophen (NORCO/VICODIN) 5-325 MG per tablet Take 1 tablet by mouth every 6 (six) hours as needed for severe pain. 15 tablet 0 Unknown at Unknown time  . ibuprofen (ADVIL,MOTRIN) 200 MG tablet Take 800 mg by mouth every 6 (six) hours as needed for fever or moderate pain.   Unknown at Unknown time  . penicillin v potassium (VEETID) 500 MG tablet Take 1 tablet (500 mg total) by  mouth 3 (three) times daily. 30 tablet 0 Unknown at Unknown time    I have reviewed patient's Past Medical Hx, Surgical Hx, Family Hx, Social Hx, medications and allergies.   ROS:  Review of Systems  Constitutional: Negative for fever and chills.  Gastrointestinal: Positive for nausea, abdominal pain and diarrhea. Negative for vomiting, constipation, blood in stool and abdominal distention.  Genitourinary: Negative for dysuria, urgency, frequency, flank pain, vaginal bleeding, vaginal discharge and pelvic pain.  Musculoskeletal: Negative for back pain.    Physical Exam   Patient Vitals for the past 24 hrs:  BP Temp Temp src Pulse Resp SpO2 Height Weight  07/12/15 2247 (!) 109/54 mmHg 99.1 F (37.3 C) Oral 84 15 100 %  (1.6 m) 110 lb (49.896 kg)   Constitutional: Well-developed, well-nourished female in no acute distress.  Cardiovascular: normal rate Respiratory: normal effort GI: Abd soft, non-tender. Pos BS x 4 MS: Extremities nontender, no edema, normal  ROM Neurologic: Alert and oriented x 4.  GU: Neg CVAT.  Pelvic: NEFG, physiologic discharge, no blood, cervix clean. Uterus normal size. No CMT.      Labs: Results for orders placed or performed during the hospital encounter of 07/12/15 (from the past 24 hour(s))  Urinalysis, Routine w reflex microscopic (not at [facility])     Status: Abnormal   Collection Time: 07/12/15 10:48 PM  Result Value Ref Range   Color, Urine YELLOW YELLOW   APPearance HAZY (A) CLEAR   Specific Gravity, Urine 1.025 1.005 - 1.030   pH 6.5 5.0 - 8.0   Glucose, UA NEGATIVE NEGATIVE mg/dL   Hgb urine dipstick NEGATIVE NEGATIVE   Bilirubin Urine NEGATIVE NEGATIVE   Ketones, ur NEGATIVE NEGATIVE mg/dL   Protein, ur NEGATIVE NEGATIVE mg/dL   Urobilinogen, UA 4.0 (H) 0.0 - 1.0 mg/dL   Nitrite NEGATIVE NEGATIVE   Leukocytes, UA SMALL (A) NEGATIVE  Urine microscopic-add on     Status: Abnormal   Collection Time: 07/12/15 10:48 PM  Result Value Ref Range   Squamous Epithelial / LPF MANY (A) RARE   WBC, UA 3-6 <3 WBC/hpf   Bacteria, UA FEW (A) RARE   Urine-Other MUCOUS PRESENT   Pregnancy, urine POC     Status: Abnormal   Collection Time: 07/12/15 10:57 PM  Result Value Ref Range   Preg Test, Ur POSITIVE (A) NEGATIVE  hCG, quantitative, pregnancy     Status: Abnormal   Collection Time: 07/12/15 11:29 PM  Result Value Ref Range   hCG, Beta Chain, Quant, S 23032 (H) <5 mIU/mL  CBC     Status: None   Collection Time: 07/12/15 11:29 PM  Result Value Ref Range   WBC 5.5 4.0 - 10.5 K/uL   RBC 4.68 3.87 - 5.11 MIL/uL   Hemoglobin 13.9 12.0 - 15.0 g/dL   HCT 16.1 09.6 - 04.5 %   MCV 84.4 78.0 - 100.0 fL   MCH 29.7 26.0 - 34.0 pg   MCHC 35.2 30.0 - 36.0 g/dL   RDW 40.9 81.1 - 91.4 %   Platelets 212 150 - 400 K/uL    Imaging:  US Ob Comp Less 14 Wks  07/13/2015   CLINICAL DATA:  First-trimester pregnancy with low abdominal pain for 1 week.  EXAM: OBSTETRIC <14 WK [place] AND TRANSVAGINAL OB US  TECHNIQUE: Both  transabdominal and transvaginal ultrasound examinations were performed for complete evaluation of the gestation as well as the maternal uterus, adnexal regions, and pelvic cul-de-sac. Transvaginal technique was performed to  assess early pregnancy.  COMPARISON:  04/01/2013  FINDINGS: Intrauterine gestational sac: Visualized/normal in shape. There is a small crescent of subchorionic hemorrhage around the right aspect of the gestational sac, measuring no more than 2 mm in thickness or 8 mm in length.  Yolk sac:  Present and normal appearing  Embryo:  Not yet seen  MSD: 11.5  mm   5 w   6  d             [place] EDC: 03/07/2016  Maternal uterus/adnexae: Unremarkable appearance of the ovaries with no adnexal mass. No free pelvic fluid.  IMPRESSION: 1. Intrauterine gestation at 5 weeks 6 days. The fetus is not yet visible. 2. Small (8 x 2mm) subchorionic hematoma.   Electronically Signed   By: Marnee Spring M.D.   On: 07/13/2015 00:15   US Ob Transvaginal  07/13/2015   CLINICAL DATA:  First-trimester pregnancy with low abdominal pain for 1 week.  EXAM: OBSTETRIC <14 WK [place] AND TRANSVAGINAL OB US  TECHNIQUE: Both transabdominal and transvaginal ultrasound examinations were performed for complete evaluation of the gestation as well as the maternal uterus, adnexal regions, and pelvic cul-de-sac. Transvaginal technique was performed to assess early pregnancy.  COMPARISON:  04/01/2013  FINDINGS: Intrauterine gestational sac: Visualized/normal in shape. There is a small crescent of subchorionic hemorrhage around the right aspect of the gestational sac, measuring no more than 2 mm in thickness or 8 mm in length.  Yolk sac:  Present and normal appearing  Embryo:  Not yet seen  MSD: 11.5  mm   5 w   6  d             [place] EDC: 03/07/2016  Maternal uterus/adnexae: Unremarkable appearance of the ovaries with no adnexal mass. No free pelvic fluid.  IMPRESSION: 1. Intrauterine gestation at 5 weeks 6 days. The fetus is not yet visible. 2.  Small (8 x 2mm) subchorionic hematoma.   Electronically Signed   By: Marnee Spring M.D.   On: 07/13/2015 00:15    MAU Course: CBC, Quant, ultrasound. Declined repeat GC/Chlamydia cultures or wet prep due to recent exams.   MDM: 24 year old female with history of SAB 2 and irregular cycles presents to maternity admissions with low abdominal pain and positive pregnancy test. Patient was evaluated for ectopic pregnancy, miscarriage as well as GI, urinary and musculoskeletal causes of abdominal pain. Ultrasound confirms intrauterine pregnancy, although viability not yet confirmed. No emergent conditions apparent. Pain most likely intestinal cramping related loose stools. Maybe mild gastroenteritis or diet-related.   Assessment: 1. Abdominal pain affecting pregnancy, antepartum   2. Subchorionic hemorrhage in first trimester   3. Loose stools     Plan: Discharge home in stable condition.  First trimester precautions Pregnancy verification letter given. List of providers given. Patient encouraged to consider dietary changes that may have caused changes in her bowel habits.     Follow-up Information    Follow up with Obstetrician of your choice.   Why:  Start prenatal care      Follow up with [facility] ADMISS[facility].   Why:  As needed in emergencies   Contact information:   [address] [id number] mc [place] [address] [phone]        Medication List    STOP taking these medications        azithromycin 250 MG tablet  Commonly known as:  ZITHROMAX     HYDROcodone-acetaminophen 5-325 MG per tablet  Commonly known as:  NORCO/VICODIN     ibuprofen 200 MG tablet  Commonly known as:  ADVIL,MOTRIN     penicillin v potassium 500 MG tablet  Commonly known as:  VEETID      TAKE these medications        CONCEPT OB 130-92.4-1 MG Caps  Take 1 tablet by mouth daily.        Cannon Ball, CNM 07/13/2015 1:28  AM

## 2015-07-13 NOTE — Discharge Instructions (Signed)
Abdominal Pain During Pregnancy Abdominal pain is common in pregnancy. Most of the time, it does not cause harm. There are many causes of abdominal pain. Some causes are more serious than others. Some of the causes of abdominal pain in pregnancy are easily diagnosed. Occasionally, the diagnosis takes time to understand. Other times, the cause is not determined. Abdominal pain can be a sign that something is very wrong with the pregnancy, or the pain may have nothing to do with the pregnancy at all. For this reason, always tell your health care provider if you have any abdominal discomfort. HOME CARE INSTRUCTIONS  Monitor your abdominal pain for any changes. The following actions may help to alleviate any discomfort you are experiencing:  Do not have sexual intercourse or put anything in your vagina until your symptoms go away completely.  Get plenty of rest until your pain improves.  Drink clear fluids if you feel nauseous. Avoid solid food as long as you are uncomfortable or nauseous.  Only take over-the-counter or prescription medicine as directed by your health care provider.  Keep all follow-up appointments with your health care provider. SEEK IMMEDIATE MEDICAL CARE IF:  You are bleeding, leaking fluid, or passing tissue from the vagina.  You have increasing pain or cramping.  You have persistent vomiting.  You have painful or bloody urination.  You have a fever.  You notice a decrease in your baby's movements.  You have extreme weakness or feel faint.  You have shortness of breath, with or without abdominal pain.  You develop a severe headache with abdominal pain.  You have abnormal vaginal discharge with abdominal pain.  You have persistent diarrhea.  You have abdominal pain that continues even after rest, or gets worse. MAKE SURE YOU:   Understand these instructions.  Will watch your condition.  Will get help right away if you are not doing well or get  worse. Document Released: 11/13/2005 Document Revised: 09/03/2013 Document Reviewed: 06/12/2013 [facility] Patient Information 2015 [place], [place]. This information is not intended to replace advice given to you by your health care provider. Make sure you discuss any questions you have with your health care provider.  Subchorionic Hematoma A subchorionic hematoma is a gathering of blood between the outer wall of the placenta and the inner wall of the womb (uterus). The placenta is the organ that connects the fetus to the wall of the uterus. The placenta performs the feeding, breathing (oxygen to the fetus), and waste removal (excretory work) of the fetus.  Subchorionic hematoma is the most common abnormality found on a result from ultrasonography done during the first trimester or early second trimester of pregnancy. If there has been little or no vaginal bleeding, early small hematomas usually shrink on their own and do not affect your baby or pregnancy. The blood is gradually absorbed over 1-2 weeks. When bleeding starts later in pregnancy or the hematoma is larger or occurs in an older pregnant woman, the outcome may not be as good. Larger hematomas may get bigger, which increases the chances for miscarriage. Subchorionic hematoma also increases the risk of premature detachment of the placenta from the uterus, preterm (premature) labor, and stillbirth. HOME CARE INSTRUCTIONS  Stay on bed rest if your health care provider recommends this. Although bed rest will not prevent more bleeding or prevent a miscarriage, your health care provider may recommend bed rest until you are advised otherwise.  Avoid heavy lifting (more than 10 lb [4.5 kg]), exercise, sexual intercourse, or douching  as directed by your health care provider.  Keep track of the number of pads you use each day and how soaked (saturated) they are. Write down this information.  Do not use tampons.  Keep all follow-up appointments as  directed by your health care provider. Your health care provider may ask you to have follow-up blood tests or ultrasound tests or both. SEEK IMMEDIATE MEDICAL CARE IF:  You have severe cramps in your stomach, back, abdomen, or pelvis.  You have a fever.  You pass large clots or tissue. Save any tissue for your health care provider to look at.  Your bleeding increases or you become lightheaded, feel weak, or have fainting episodes. Document Released: 02/28/2007 Document Revised: 03/30/2014 Document Reviewed: 06/12/2013 [facility] Patient Information 2015 [place], [place]. This information is not intended to replace advice given to you by your health care provider. Make sure you discuss any questions you have with your health care provider.  [facility] Providers   Francoise Ceo      Phone: [phone]  [facility]     Phone: [phone]  [facility] [place]  Phone: [phone]  [facility] for [facility] [place]  Phone: [phone]  [facility]    Phone: [phone]   [facility] [place])    Phone: [phone]  [facility]    Phone: [phone]  [facility]    Phone: [phone]  [facility]    Phone: [phone]  [facility] Phone: [phone]   [facility]  Phone: [phone]  [facility]    Phone: [phone]  [facility]   Phone: [phone]  [facility]      Phone: [phone]  [facility]    Phone: [phone]  [facility]     Phone: [phone]

## 2015-07-16 ENCOUNTER — Encounter (HOSPITAL_COMMUNITY): Payer: Self-pay

## 2015-07-16 ENCOUNTER — Inpatient Hospital Stay (HOSPITAL_COMMUNITY)
Admission: AD | Admit: 2015-07-16 | Discharge: 2015-07-16 | Disposition: A | Discharge status: Home / Self Care | Payer: Self-pay | Source: Ambulatory Visit | Attending: Obstetrics & Gynecology | Admitting: Obstetrics & Gynecology

## 2015-07-16 DIAGNOSIS — O219 Vomiting of pregnancy, unspecified: Secondary | ICD-10-CM | POA: Insufficient documentation

## 2015-07-16 DIAGNOSIS — Z8619 Personal history of other infectious and parasitic diseases: Secondary | ICD-10-CM | POA: Insufficient documentation

## 2015-07-16 DIAGNOSIS — Z3A01 Less than 8 weeks gestation of pregnancy: Secondary | ICD-10-CM | POA: Insufficient documentation

## 2015-07-16 DIAGNOSIS — Z87891 Personal history of nicotine dependence: Secondary | ICD-10-CM | POA: Insufficient documentation

## 2015-07-16 LAB — URINALYSIS, ROUTINE W REFLEX MICROSCOPIC
Bilirubin Urine: NEGATIVE
GLUCOSE, UA: NEGATIVE mg/dL
HGB URINE DIPSTICK: NEGATIVE
Nitrite: NEGATIVE
PROTEIN: NEGATIVE mg/dL
Specific Gravity, Urine: 1.025 (ref 1.005–1.030)
UROBILINOGEN UA: 1 mg/dL (ref 0.0–1.0)
pH: 7 (ref 5.0–8.0)

## 2015-07-16 LAB — URINE MICROSCOPIC-ADD ON

## 2015-07-16 MED ORDER — DEXTROSE IN LACTATED RINGERS 5 % IV SOLN
25.0000 mg | Freq: Once | INTRAVENOUS | Status: AC
  Administered 2015-07-16: 25 mg via INTRAVENOUS
  Filled 2015-07-16: qty 1

## 2015-07-16 MED ORDER — PROMETHAZINE HCL 25 MG PO TABS
12.5000 mg | ORAL_TABLET | Freq: Four times a day (QID) | ORAL | Status: DC | PRN
Start: 2015-07-16 — End: 2016-08-16

## 2015-07-16 NOTE — MAU Note (Signed)
Pt states she has been nauseated on the morning of 8/18 and has continued. Pt reports she has vomited 6 times since the morning of 8/18.  Pt states she has also had diarrhea. Pt denies any fever.  Pt states her hands and feet have been numb off and on since she got up tonight.

## 2015-07-16 NOTE — Discharge Instructions (Signed)
Prenatal Care [facility]    [facility]N  & Infertility  [phone]     Phone: [phone]          [facility]                      [facility]  @[place]     Phone: [phone]  Phone: [phone]         [facility]     Phone: [phone]  Phone: [phone]           [facility] @ [place]                hone: [phone]  Phone: [phone]         [facility] Dr. Francoise Ceo      Phone: [phone]  Phone: [phone]         [facility] [place] [facility] Dept[facility]                Phone: [phone]  [facility]   [phone]    Family [address] [place])          Phone: [phone] [facility] &[facility]ertility   Phone: [phone]  Morning Sickness Morning sickness is when you feel sick to your stomach (nauseous) during pregnancy. You may feel sick to your stomach and throw up (vomit). You may feel sick in the morning, but you can feel this way any time of day. Some women feel very sick to their stomach and cannot stop throwing up (hyperemesis gravidarum). HOME CARE  Only take medicines as told by your doctor.  Take multivitamins as told by your doctor. Taking multivitamins before getting pregnant can stop or lessen the harshness of morning sickness.  Eat dry toast or unsalted crackers before getting out of bed.  Eat 5 to 6 small meals a day.  Eat dry and bland foods like rice and baked potatoes.  Do not drink liquids with meals. Drink between meals.  Do not eat greasy, fatty, or spicy foods.  Have someone cook for you if the smell of food causes you to feel sick or throw up.  If you feel sick to your stomach after taking prenatal vitamins, take them at night or with a snack.  Eat protein when you need a snack (nuts, yogurt, cheese).  Eat unsweetened gelatins for dessert.  Wear a bracelet  used for sea sickness (acupressure wristband).  Go to a doctor that puts thin needles into certain body points (acupuncture) to improve how you feel.  Do not smoke.  Use a humidifier to keep the air in your house free of odors.  Get lots of fresh air. GET HELP IF:  You need medicine to feel better.  You feel dizzy or lightheaded.  You are losing weight. GET HELP RIGHT AWAY IF:   You feel very sick to your stomach and cannot stop throwing up.  You pass out (faint). MAKE SURE YOU:  Understand these instructions.  Will watch your condition.  Will get help right away if you are not doing well or get worse. Document Released: 12/21/2004 Document Revised: 11/18/2013 Document Reviewed: 04/30/2013 [facility] Patient Information 2015 [place], [place]. This information is not intended to replace advice given to you by your health care provider. Make sure you discuss any questions you have with your health care provider. First Trimester of Pregnancy The  first trimester of pregnancy is from week 1 until the end of week 12 (months 1 through 3). A week after a sperm fertilizes an egg, the egg will implant on the wall of the uterus. This embryo will begin to develop into a baby. Genes from you and your partner are forming the baby. The female genes determine whether the baby is a boy or a girl. At 6-8 weeks, the eyes and face are formed, and the heartbeat can be seen on ultrasound. At the end of 12 weeks, all the baby's organs are formed.  Now that you are pregnant, you will want to do everything you can to have a healthy baby. Two of the most important things are to get good prenatal care and to follow your health care provider's instructions. Prenatal care is all the medical care you receive before the baby's birth. This care will help prevent, find, and treat any problems during the pregnancy and childbirth. BODY CHANGES Your body goes through many changes during pregnancy. The changes vary from  woman to woman.   You may gain or lose a couple of pounds at first.  You may feel sick to your stomach (nauseous) and throw up (vomit). If the vomiting is uncontrollable, call your health care provider.  You may tire easily.  You may develop headaches that can be relieved by medicines approved by your health care provider.  You may urinate more often. Painful urination may mean you have a bladder infection.  You may develop heartburn as a result of your pregnancy.  You may develop constipation because certain hormones are causing the muscles that push waste through your intestines to slow down.  You may develop hemorrhoids or swollen, bulging veins (varicose veins).  Your breasts may begin to grow larger and become tender. Your nipples may stick out more, and the tissue that surrounds them (areola) may become darker.  Your gums may bleed and may be sensitive to brushing and flossing.  Dark spots or blotches (chloasma, mask of pregnancy) may develop on your face. This will likely fade after the baby is born.  Your menstrual periods will stop.  You may have a loss of appetite.  You may develop cravings for certain kinds of food.  You may have changes in your emotions from day to day, such as being excited to be pregnant or being concerned that something may go wrong with the pregnancy and baby.  You may have more vivid and strange dreams.  You may have changes in your hair. These can include thickening of your hair, rapid growth, and changes in texture. Some women also have hair loss during or after pregnancy, or hair that feels dry or thin. Your hair will most likely return to normal after your baby is born. WHAT TO EXPECT AT YOUR PRENATAL VISITS During a routine prenatal visit:  You will be weighed to make sure you and the baby are growing normally.  Your blood pressure will be taken.  Your abdomen will be measured to track your baby's growth.  The fetal heartbeat will be  listened to starting around week 10 or 12 of your pregnancy.  Test results from any previous visits will be discussed. Your health care provider may ask you:  How you are feeling.  If you are feeling the baby move.  If you have had any abnormal symptoms, such as leaking fluid, bleeding, severe headaches, or abdominal cramping.  If you have any questions. Other tests that may be performed  during your first trimester include:  Blood tests to find your blood type and to check for the presence of any previous infections. They will also be used to check for low iron levels (anemia) and Rh antibodies. Later in the pregnancy, blood tests for diabetes will be done along with other tests if problems develop.  Urine tests to check for infections, diabetes, or protein in the urine.  An ultrasound to confirm the proper growth and development of the baby.  An amniocentesis to check for possible genetic problems.  Fetal screens for spina bifida and Down syndrome.  You may need other tests to make sure you and the baby are doing well. HOME CARE INSTRUCTIONS  Medicines  Follow your health care provider's instructions regarding medicine use. Specific medicines may be either safe or unsafe to take during pregnancy.  Take your prenatal vitamins as directed.  If you develop constipation, try taking a stool softener if your health care provider approves. Diet  Eat regular, well-balanced meals. Choose a variety of foods, such as meat or vegetable-based protein, fish, milk and low-fat dairy products, vegetables, fruits, and whole grain breads and cereals. Your health care provider will help you determine the amount of weight gain that is right for you.  Avoid raw meat and uncooked cheese. These carry germs that can cause birth defects in the baby.  Eating four or five small meals rather than three large meals a day may help relieve nausea and vomiting. If you start to feel nauseous, eating a few soda  crackers can be helpful. Drinking liquids between meals instead of during meals also seems to help nausea and vomiting.  If you develop constipation, eat more high-fiber foods, such as fresh vegetables or fruit and whole grains. Drink enough fluids to keep your urine clear or pale yellow. Activity and Exercise  Exercise only as directed by your health care provider. Exercising will help you:  Control your weight.  Stay in shape.  Be prepared for labor and delivery.  Experiencing pain or cramping in the lower abdomen or low back is a good sign that you should stop exercising. Check with your health care provider before continuing normal exercises.  Try to avoid standing for long periods of time. Move your legs often if you must stand in one place for a long time.  Avoid heavy lifting.  Wear low-heeled shoes, and practice good posture.  You may continue to have sex unless your health care provider directs you otherwise. Relief of Pain or Discomfort  Wear a good support bra for breast tenderness.   Take warm sitz baths to soothe any pain or discomfort caused by hemorrhoids. Use hemorrhoid cream if your health care provider approves.   Rest with your legs elevated if you have leg cramps or low back pain.  If you develop varicose veins in your legs, wear support hose. Elevate your feet for 15 minutes, 3-4 times a day. Limit salt in your diet. Prenatal Care  Schedule your prenatal visits by the twelfth week of pregnancy. They are usually scheduled monthly at first, then more often in the last 2 months before delivery.  Write down your questions. Take them to your prenatal visits.  Keep all your prenatal visits as directed by your health care provider. Safety  Wear your seat belt at all times when driving.  Make a list of emergency phone numbers, including numbers for family, friends, the hospital, and police and fire departments. General Tips  Ask your health care  provider  for a referral to a local prenatal education class. Begin classes no later than at the beginning of month 6 of your pregnancy.  Ask for help if you have counseling or nutritional needs during pregnancy. Your health care provider can offer advice or refer you to specialists for help with various needs.  Do not use hot tubs, steam rooms, or saunas.  Do not douche or use tampons or scented sanitary pads.  Do not cross your legs for long periods of time.  Avoid cat litter boxes and soil used by cats. These carry germs that can cause birth defects in the baby and possibly loss of the fetus by miscarriage or stillbirth.  Avoid all smoking, herbs, alcohol, and medicines not prescribed by your health care provider. Chemicals in these affect the formation and growth of the baby.  Schedule a dentist appointment. At home, brush your teeth with a soft toothbrush and be gentle when you floss. SEEK MEDICAL CARE IF:   You have dizziness.  You have mild pelvic cramps, pelvic pressure, or nagging pain in the abdominal area.  You have persistent nausea, vomiting, or diarrhea.  You have a bad smelling vaginal discharge.  You have pain with urination.  You notice increased swelling in your face, hands, legs, or ankles. SEEK IMMEDIATE MEDICAL CARE IF:   You have a fever.  You are leaking fluid from your vagina.  You have spotting or bleeding from your vagina.  You have severe abdominal cramping or pain.  You have rapid weight gain or loss.  You vomit blood or material that looks like coffee grounds.  You are exposed to [place] measles and have never had them.  You are exposed to fifth disease or chickenpox.  You develop a severe headache.  You have shortness of breath.  You have any kind of trauma, such as from a fall or a car accident. Document Released: 11/07/2001 Document Revised: 03/30/2014 Document Reviewed: 09/23/2013 [facility] Patient Information 2015 [place], [place]. This  information is not intended to replace advice given to you by your health care provider. Make sure you discuss any questions you have with your health care provider.

## 2015-07-16 NOTE — MAU Note (Signed)
PT  SAYS  SHE HAS VOMITED   5X  TONIGHT.    SAYS  WHEN  SHE WOKE UP HER HANDS AND  FEET  WERE  NUMB.  NOW  NUMBNESS IS  GOING  AWAY.

## 2015-07-16 NOTE — MAU Provider Note (Signed)
History     CSN: [id number]  Arrival date and time: 07/16/15 0209   First Provider Initiated Contact with Patient 07/16/15 0245      Chief Complaint  Patient presents with  . Nausea  . Emesis   HPI Comments: Carrie Hampton is a 24 y.o. G3P0020 at [redacted]w[redacted]d who presents today with nausea and vomiting. She states that this has been ongoing for about a week. However, today was much worse. She states that she has vomited about 6 times today. She states that she is also feeling weak, and that when she woke up this morning her hands were numb. She denies any cramping or vaginal bleeding.   Emesis  This is a new problem. The current episode started in the past 7 days. The problem occurs 5 to 10 times per day. The problem has been gradually worsening. The emesis has an appearance of stomach contents. There has been no fever. Pertinent negatives include no abdominal pain, diarrhea or fever. Risk factors: pregnancy  She has tried nothing for the symptoms.    Past Medical History  Diagnosis Date  . No pertinent past medical history   . STD (female) 2013    Chlamydia    Past Surgical History  Procedure Laterality Date  . No past surgeries    . Tooth extraction      Family History  Problem Relation Age of Onset  . Alcohol abuse Neg Hx   . Arthritis Neg Hx   . Asthma Neg Hx   . Birth defects Neg Hx   . Cancer Neg Hx   . COPD Neg Hx   . Depression Neg Hx   . Diabetes Neg Hx   . Drug abuse Neg Hx   . Early death Neg Hx   . Hearing loss Neg Hx   . Heart disease Neg Hx   . Hyperlipidemia Neg Hx   . Hypertension Neg Hx   . Kidney disease Neg Hx   . Learning disabilities Neg Hx   . Mental illness Neg Hx   . Mental retardation Neg Hx   . Miscarriages / Stillbirths Neg Hx   . Stroke Neg Hx   . Vision loss Neg Hx     Social History  Substance Use Topics  . Smoking status: Former Smoker    Quit date: 10/20/2012  . Smokeless tobacco: Never Used  . Alcohol Use: 1.2 oz/week    2  Shots of liquor per week     Comment: patient states that she drinks liquor once a week. 04/01/13    Allergies: No Known Allergies  Prescriptions prior to admission  Medication Sig Dispense Refill Last Dose  . Prenat w/o A Vit-FeFum-FePo-FA (CONCEPT OB) 130-92.4-1 MG CAPS Take 1 tablet by mouth daily. 30 capsule 12     Review of Systems  Constitutional: Negative for fever.  Gastrointestinal: Positive for nausea and vomiting. Negative for abdominal pain, diarrhea and constipation.  Genitourinary: Negative for dysuria, urgency and frequency.  Neurological: Positive for weakness.   Physical Exam   Blood pressure 105/58, pulse 66, temperature 98.2 F (36.8 C), temperature source Oral, resp. rate 18, last menstrual period 05/28/2015, unknown if currently breastfeeding.  Physical Exam  Nursing note and vitals reviewed. Constitutional: She is oriented to person, place, and time. She appears well-developed and well-nourished. No distress.  HENT:  Head: Normocephalic.  Cardiovascular: Normal rate.   Respiratory: Effort normal.  GI: Soft. There is no tenderness. There is no rebound.  Neurological: She is  alert and oriented to person, place, and time.  Skin: Skin is warm and dry.  Psychiatric: She has a normal mood and affect.    MAU Course  Procedures  MDM 0429: Patient has had 1L D5LR with  of phenergan. She reports that she is feeling better.  Assessment and Plan   1. Nausea/vomiting in pregnancy    DC home Comfort measures reviewed  1st Trimester precautions  RX: phenergan PRN #[id number]  Return to MAU as needed [facility] as soon as possible.   Follow-up Information    Schedule an appointment as soon as possible for a visit with [facility] HEALTH DEPT [place].   Contact information:   [address] [facility] [address] [phone]        Tawnya Crook 07/16/2015, 2:46 AM

## 2015-09-21 ENCOUNTER — Encounter (HOSPITAL_COMMUNITY): Payer: Self-pay | Admitting: *Deleted

## 2015-09-21 ENCOUNTER — Inpatient Hospital Stay (HOSPITAL_COMMUNITY)
Admission: AD | Admit: 2015-09-21 | Discharge: 2015-09-21 | Disposition: A | Discharge status: Home / Self Care | Payer: Self-pay | Source: Ambulatory Visit | Attending: Obstetrics & Gynecology | Admitting: Obstetrics & Gynecology

## 2015-09-21 ENCOUNTER — Inpatient Hospital Stay (HOSPITAL_COMMUNITY): Payer: Self-pay

## 2015-09-21 DIAGNOSIS — E860 Dehydration: Secondary | ICD-10-CM

## 2015-09-21 DIAGNOSIS — Z87891 Personal history of nicotine dependence: Secondary | ICD-10-CM | POA: Insufficient documentation

## 2015-09-21 DIAGNOSIS — Z3491 Encounter for supervision of normal pregnancy, unspecified, first trimester: Secondary | ICD-10-CM

## 2015-09-21 DIAGNOSIS — O209 Hemorrhage in early pregnancy, unspecified: Secondary | ICD-10-CM | POA: Insufficient documentation

## 2015-09-21 DIAGNOSIS — O26891 Other specified pregnancy related conditions, first trimester: Secondary | ICD-10-CM | POA: Insufficient documentation

## 2015-09-21 DIAGNOSIS — R112 Nausea with vomiting, unspecified: Secondary | ICD-10-CM

## 2015-09-21 DIAGNOSIS — R197 Diarrhea, unspecified: Secondary | ICD-10-CM | POA: Insufficient documentation

## 2015-09-21 DIAGNOSIS — R109 Unspecified abdominal pain: Secondary | ICD-10-CM | POA: Insufficient documentation

## 2015-09-21 DIAGNOSIS — O219 Vomiting of pregnancy, unspecified: Secondary | ICD-10-CM | POA: Insufficient documentation

## 2015-09-21 DIAGNOSIS — R51 Headache: Secondary | ICD-10-CM | POA: Insufficient documentation

## 2015-09-21 LAB — CBC
HCT: 39.6 % (ref 36.0–46.0)
Hemoglobin: 14 g/dL (ref 12.0–15.0)
MCH: 30.2 pg (ref 26.0–34.0)
MCHC: 35.4 g/dL (ref 30.0–36.0)
MCV: 85.5 fL (ref 78.0–100.0)
PLATELETS: 213 10*3/uL (ref 150–400)
RBC: 4.63 MIL/uL (ref 3.87–5.11)
RDW: 11.9 % (ref 11.5–15.5)
WBC: 7.8 10*3/uL (ref 4.0–10.5)

## 2015-09-21 LAB — URINALYSIS, ROUTINE W REFLEX MICROSCOPIC
Glucose, UA: NEGATIVE mg/dL
LEUKOCYTES UA: NEGATIVE
NITRITE: NEGATIVE
PROTEIN: NEGATIVE mg/dL
Specific Gravity, Urine: >1.03 — ABNORMAL HIGH (ref 1.005–1.030)
Urobilinogen, UA: 0.2 mg/dL (ref 0.0–1.0)
pH: 6 (ref 5.0–8.0)

## 2015-09-21 LAB — WET PREP, GENITAL
Clue Cells Wet Prep HPF POC: NONE SEEN
TRICH WET PREP: NONE SEEN
YEAST WET PREP: NONE SEEN

## 2015-09-21 LAB — URINE MICROSCOPIC-ADD ON

## 2015-09-21 LAB — POCT PREGNANCY, URINE: Preg Test, Ur: POSITIVE — AB

## 2015-09-21 LAB — HCG, QUANTITATIVE, PREGNANCY: hCG, Beta Chain, Quant, S: 33960 m[IU]/mL — ABNORMAL HIGH (ref <5)

## 2015-09-21 MED ORDER — DEXTROSE 5 % IN LACTATED RINGERS IV BOLUS
1000.0000 mL | Freq: Once | INTRAVENOUS | Status: AC
  Administered 2015-09-21: 1000 mL via INTRAVENOUS

## 2015-09-21 NOTE — MAU Provider Note (Signed)
History     CSN: [id number]  Arrival date and time: 09/21/15 1359   First Provider Initiated Contact with Patient 09/21/15 1436      Chief Complaint  Patient presents with  . Emesis  . Diarrhea  . Generalized Body Aches   HPI  Pt is ?pregnant with LMP 07/2015 which lasted several weeks.  Pt had TAB in 06/2015. "I don't use birth control" but does use condoms sometimes. Pt has had nausea and vomiting since Sunday night.  Pt also has pelvic cramping and vaginal brown/red light vaginal discharge for 2 days.  Pt has had diarrhea 4 to5 times/day dark watery. Pt denies fever but has had chills and headache Pt last ate Sunday night- has been able to drink without vomiting today. Pt last had IC 1 week ago- no pain  Past Medical History  Diagnosis Date  . No pertinent past medical history   . STD (female) 2013    Chlamydia    Past Surgical History  Procedure Laterality Date  . Tooth extraction      Family History  Problem Relation Age of Onset  . Alcohol abuse Neg Hx   . Arthritis Neg Hx   . Asthma Neg Hx   . Birth defects Neg Hx   . Cancer Neg Hx   . COPD Neg Hx   . Depression Neg Hx   . Diabetes Neg Hx   . Drug abuse Neg Hx   . Early death Neg Hx   . Hearing loss Neg Hx   . Heart disease Neg Hx   . Hyperlipidemia Neg Hx   . Hypertension Neg Hx   . Kidney disease Neg Hx   . Learning disabilities Neg Hx   . Mental illness Neg Hx   . Mental retardation Neg Hx   . Miscarriages / Stillbirths Neg Hx   . Stroke Neg Hx   . Vision loss Neg Hx     Social History  Substance Use Topics  . Smoking status: Former Smoker    Quit date: 10/20/2012  . Smokeless tobacco: Never Used  . Alcohol Use: No    Allergies: No Known Allergies  Prescriptions prior to admission  Medication Sig Dispense Refill Last Dose  . Prenat w/o A Vit-FeFum-FePo-FA (CONCEPT OB) 130-92.4-1 MG CAPS Take 1 tablet by mouth daily. 30 capsule 12   . promethazine (PHENERGAN) 25 MG tablet Take 0.5-1  tablets (12.5-25 mg total) by mouth every 6 (six) hours as needed. 30 tablet 0     Review of Systems  Constitutional: Positive for chills. Negative for fever.  Gastrointestinal: Positive for nausea, vomiting, abdominal pain and diarrhea.  Genitourinary: Negative for dysuria, urgency and flank pain.  Neurological: Positive for headaches.   Physical Exam   Blood pressure 105/64, pulse 72, temperature 98.2 F (36.8 C), temperature source Oral, resp. rate 16, weight 108 lb (48.988 kg), unknown if currently breastfeeding.  Physical Exam  Nursing note and vitals reviewed. Constitutional: She is oriented to person, place, and time. She appears well-developed and well-nourished. No distress.  HENT:  Head: Normocephalic.  Eyes: Pupils are equal, round, and reactive to light.  Neck: Normal range of motion. Neck supple.  Cardiovascular: Normal rate.   Respiratory: Effort normal.  GI: Soft. She exhibits no distension. There is no tenderness. There is no rebound.  Genitourinary:  Small amount of creamy discharge in vault with small amount of brown mucous discharge from os; uterus NSSC mildly tender- no CMT; adnexa without palpable enlargement or  tenderness  Musculoskeletal: Normal range of motion.  Neurological: She is alert and oriented to person, place, and time.  Skin: Skin is warm and dry.  Psychiatric: She has a normal mood and affect.    MAU Course  Procedures Results for orders placed or performed during the hospital encounter of 09/21/15 (from the past 24 hour(s))  Urinalysis, Routine w reflex microscopic (not at [facility])     Status: Abnormal   Collection Time: 09/21/15  2:15 PM  Result Value Ref Range   Color, Urine YELLOW YELLOW   APPearance HAZY (A) CLEAR   Specific Gravity, Urine >1.030 (H) 1.005 - 1.030   pH 6.0 5.0 - 8.0   Glucose, UA NEGATIVE NEGATIVE mg/dL   Hgb urine dipstick LARGE (A) NEGATIVE   Bilirubin Urine SMALL (A) NEGATIVE   Ketones, ur >80 (A) NEGATIVE mg/dL    Protein, ur NEGATIVE NEGATIVE mg/dL   Urobilinogen, UA 0.2 0.0 - 1.0 mg/dL   Nitrite NEGATIVE NEGATIVE   Leukocytes, UA NEGATIVE NEGATIVE  Urine microscopic-add on     Status: Abnormal   Collection Time: 09/21/15  2:15 PM  Result Value Ref Range   Squamous Epithelial / LPF MANY (A) RARE   WBC, UA 3-6 <3 WBC/hpf   RBC / HPF 0-2 <3 RBC/hpf   Bacteria, UA MANY (A) RARE   Urine-Other MUCOUS PRESENT   Pregnancy, urine POC     Status: Abnormal   Collection Time: 09/21/15  2:37 PM  Result Value Ref Range   Preg Test, Ur POSITIVE (A) NEGATIVE  GC/chlamydia pending Wet prep [place] Care turned over to [facility], CNM Assessment and Plan  Nausea and vomiting with dehydration LINEBERRY,SUSAN 09/21/2015, 2:36 PM   US Ob Transvaginal  09/21/2015  CLINICAL DATA:  Patient with abdominal cramping and vaginal spotting. Vomiting. EXAM: TRANSVAGINAL OB ULTRASOUND TECHNIQUE: Transvaginal ultrasound was performed for complete evaluation of the gestation as well as the maternal uterus, adnexal regions, and pelvic cul-de-sac. COMPARISON:  Ultrasound 07/12/2015 FINDINGS: Intrauterine gestational sac: Visualized/normal in shape. Yolk sac:  Present Embryo:  Present Cardiac Activity: Present. The heartbeat is suggested on cine clip. No definite tracing is able to be captured. CRL:   2  mm   5 w 5 d                  [place] EDC: 05/18/2016 Maternal uterus/adnexae: Normal right and left ovaries. No subchorionic hemorrhage. No free fluid in the pelvis. IMPRESSION: Single live intrauterine gestation. Electronically Signed   By: Annia Belt M.D.   On: 09/21/2015 16:57    A: 1. Normal IUP (intrauterine pregnancy) on prenatal ultrasound, first trimester   2. Vaginal bleeding in pregnancy, first trimester    P: D/C home Pt given pregnancy verification letter, list of safe OTC medications including meds for nausea/diarrhea, and list of OB/Gyn providers in [place] F/U with early prenatal care Return to MAU as  needed for emergencies  LEFTWICH-KIRBY, Keierra Nudo, CNM 6:01 PM

## 2015-09-21 NOTE — Discharge Instructions (Signed)
First Trimester of Pregnancy The first trimester of pregnancy is from week 1 until the end of week 12 (months 1 through 3). A week after a sperm fertilizes an egg, the egg will implant on the wall of the uterus. This embryo will begin to develop into a baby. Genes from you and your partner are forming the baby. The female genes determine whether the baby is a boy or a girl. At 6-8 weeks, the eyes and face are formed, and the heartbeat can be seen on ultrasound. At the end of 12 weeks, all the baby's organs are formed.  Now that you are pregnant, you will want to do everything you can to have a healthy baby. Two of the most important things are to get good prenatal care and to follow your health care provider's instructions. Prenatal care is all the medical care you receive before the baby's birth. This care will help prevent, find, and treat any problems during the pregnancy and childbirth. BODY CHANGES Your body goes through many changes during pregnancy. The changes vary from woman to woman.   You may gain or lose a couple of pounds at first.  You may feel sick to your stomach (nauseous) and throw up (vomit). If the vomiting is uncontrollable, call your health care provider.  You may tire easily.  You may develop headaches that can be relieved by medicines approved by your health care provider.  You may urinate more often. Painful urination may mean you have a bladder infection.  You may develop heartburn as a result of your pregnancy.  You may develop constipation because certain hormones are causing the muscles that push waste through your intestines to slow down.  You may develop hemorrhoids or swollen, bulging veins (varicose veins).  Your breasts may begin to grow larger and become tender. Your nipples may stick out more, and the tissue that surrounds them (areola) may become darker.  Your gums may bleed and may be sensitive to brushing and flossing.  Dark spots or blotches (chloasma,  mask of pregnancy) may develop on your face. This will likely fade after the baby is born.  Your menstrual periods will stop.  You may have a loss of appetite.  You may develop cravings for certain kinds of food.  You may have changes in your emotions from day to day, such as being excited to be pregnant or being concerned that something may go wrong with the pregnancy and baby.  You may have more vivid and strange dreams.  You may have changes in your hair. These can include thickening of your hair, rapid growth, and changes in texture. Some women also have hair loss during or after pregnancy, or hair that feels dry or thin. Your hair will most likely return to normal after your baby is born. WHAT TO EXPECT AT YOUR PRENATAL VISITS During a routine prenatal visit:  You will be weighed to make sure you and the baby are growing normally.  Your blood pressure will be taken.  Your abdomen will be measured to track your baby's growth.  The fetal heartbeat will be listened to starting around week 10 or 12 of your pregnancy.  Test results from any previous visits will be discussed. Your health care provider may ask you:  How you are feeling.  If you are feeling the baby move.  If you have had any abnormal symptoms, such as leaking fluid, bleeding, severe headaches, or abdominal cramping.  If you are using any tobacco products,   including cigarettes, chewing tobacco, and electronic cigarettes.  If you have any questions. Other tests that may be performed during your first trimester include:  Blood tests to find your blood type and to check for the presence of any previous infections. They will also be used to check for low iron levels (anemia) and Rh antibodies. Later in the pregnancy, blood tests for diabetes will be done along with other tests if problems develop.  Urine tests to check for infections, diabetes, or protein in the urine.  An ultrasound to confirm the proper growth  and development of the baby.  An amniocentesis to check for possible genetic problems.  Fetal screens for spina bifida and Down syndrome.  You may need other tests to make sure you and the baby are doing well.  HIV (human immunodeficiency virus) testing. Routine prenatal testing includes screening for HIV, unless you choose not to have this test. HOME CARE INSTRUCTIONS  Medicines  Follow your health care provider's instructions regarding medicine use. Specific medicines may be either safe or unsafe to take during pregnancy.  Take your prenatal vitamins as directed.  If you develop constipation, try taking a stool softener if your health care provider approves. Diet  Eat regular, well-balanced meals. Choose a variety of foods, such as meat or vegetable-based protein, fish, milk and low-fat dairy products, vegetables, fruits, and whole grain breads and cereals. Your health care provider will help you determine the amount of weight gain that is right for you.  Avoid raw meat and uncooked cheese. These carry germs that can cause birth defects in the baby.  Eating four or five small meals rather than three large meals a day may help relieve nausea and vomiting. If you start to feel nauseous, eating a few soda crackers can be helpful. Drinking liquids between meals instead of during meals also seems to help nausea and vomiting.  If you develop constipation, eat more high-fiber foods, such as fresh vegetables or fruit and whole grains. Drink enough fluids to keep your urine clear or pale yellow. Activity and Exercise  Exercise only as directed by your health care provider. Exercising will help you:  Control your weight.  Stay in shape.  Be prepared for labor and delivery.  Experiencing pain or cramping in the lower abdomen or low back is a good sign that you should stop exercising. Check with your health care provider before continuing normal exercises.  Try to avoid standing for long  periods of time. Move your legs often if you must stand in one place for a long time.  Avoid heavy lifting.  Wear low-heeled shoes, and practice good posture.  You may continue to have sex unless your health care provider directs you otherwise. Relief of Pain or Discomfort  Wear a good support bra for breast tenderness.   Take warm sitz baths to soothe any pain or discomfort caused by hemorrhoids. Use hemorrhoid cream if your health care provider approves.   Rest with your legs elevated if you have leg cramps or low back pain.  If you develop varicose veins in your legs, wear support hose. Elevate your feet for 15 minutes, 3-4 times a day. Limit salt in your diet. Prenatal Care  Schedule your prenatal visits by the twelfth week of pregnancy. They are usually scheduled monthly at first, then more often in the last 2 months before delivery.  Write down your questions. Take them to your prenatal visits.  Keep all your prenatal visits as directed by your   health care provider. Safety  Wear your seat belt at all times when driving.  Make a list of emergency phone numbers, including numbers for family, friends, the hospital, and police and fire departments. General Tips  Ask your health care provider for a referral to a local prenatal education class. Begin classes no later than at the beginning of month 6 of your pregnancy.  Ask for help if you have counseling or nutritional needs during pregnancy. Your health care provider can offer advice or refer you to specialists for help with various needs.  Do not use hot tubs, steam rooms, or saunas.  Do not douche or use tampons or scented sanitary pads.  Do not cross your legs for long periods of time.  Avoid cat litter boxes and soil used by cats. These carry germs that can cause birth defects in the baby and possibly loss of the fetus by miscarriage or stillbirth.  Avoid all smoking, herbs, alcohol, and medicines not prescribed by  your health care provider. Chemicals in these affect the formation and growth of the baby.  Do not use any tobacco products, including cigarettes, chewing tobacco, and electronic cigarettes. If you need help quitting, ask your health care provider. You may receive counseling support and other resources to help you quit.  Schedule a dentist appointment. At home, brush your teeth with a soft toothbrush and be gentle when you floss. SEEK MEDICAL CARE IF:   You have dizziness.  You have mild pelvic cramps, pelvic pressure, or nagging pain in the abdominal area.  You have persistent nausea, vomiting, or diarrhea.  You have a bad smelling vaginal discharge.  You have pain with urination.  You notice increased swelling in your face, hands, legs, or ankles. SEEK IMMEDIATE MEDICAL CARE IF:   You have a fever.  You are leaking fluid from your vagina.  You have spotting or bleeding from your vagina.  You have severe abdominal cramping or pain.  You have rapid weight gain or loss.  You vomit blood or material that looks like coffee grounds.  You are exposed to German measles and have never had them.  You are exposed to fifth disease or chickenpox.  You develop a severe headache.  You have shortness of breath.  You have any kind of trauma, such as from a fall or a car accident.   This information is not intended to replace advice given to you by your health care provider. Make sure you discuss any questions you have with your health care provider.   Document Released: 11/07/2001 Document Revised: 12/04/2014 Document Reviewed: 09/23/2013 Elsevier Interactive Patient Education 2016 [employer]  

## 2015-09-21 NOTE — MAU Note (Signed)
Friend cooked something for her a few days ago.  Since then has had diarrhea.  Started throwing up Sunday night, yesterday was only able to keep down fluids, by the end of the night, was unable.  No vomiting today, but diarrhea continues

## 2015-09-22 LAB — GC/CHLAMYDIA PROBE AMP (~~LOC~~) NOT AT ARMC
CHLAMYDIA, DNA PROBE: NEGATIVE
Neisseria Gonorrhea: NEGATIVE

## 2015-09-29 NOTE — MAU Note (Signed)
Reprinted discharge papers for patient

## 2016-04-20 ENCOUNTER — Encounter (HOSPITAL_COMMUNITY): Payer: Self-pay | Admitting: *Deleted

## 2016-04-20 ENCOUNTER — Emergency Department (HOSPITAL_COMMUNITY)
Admission: EM | Admit: 2016-04-20 | Discharge: 2016-04-20 | Disposition: A | Discharge status: Home / Self Care | Payer: Self-pay | Attending: Emergency Medicine | Admitting: Emergency Medicine

## 2016-04-20 DIAGNOSIS — R6883 Chills (without fever): Secondary | ICD-10-CM | POA: Insufficient documentation

## 2016-04-20 DIAGNOSIS — Z79899 Other long term (current) drug therapy: Secondary | ICD-10-CM | POA: Insufficient documentation

## 2016-04-20 DIAGNOSIS — R197 Diarrhea, unspecified: Secondary | ICD-10-CM | POA: Insufficient documentation

## 2016-04-20 DIAGNOSIS — F17210 Nicotine dependence, cigarettes, uncomplicated: Secondary | ICD-10-CM | POA: Insufficient documentation

## 2016-04-20 DIAGNOSIS — J0100 Acute maxillary sinusitis, unspecified: Secondary | ICD-10-CM | POA: Insufficient documentation

## 2016-04-20 DIAGNOSIS — Z8619 Personal history of other infectious and parasitic diseases: Secondary | ICD-10-CM | POA: Insufficient documentation

## 2016-04-20 DIAGNOSIS — R5383 Other fatigue: Secondary | ICD-10-CM | POA: Insufficient documentation

## 2016-04-20 LAB — RAPID STREP SCREEN (MED CTR MEBANE ONLY): STREPTOCOCCUS, GROUP A SCREEN (DIRECT): NEGATIVE

## 2016-04-20 MED ORDER — AMOXICILLIN 500 MG PO CAPS: 500.0000 mg | ORAL_CAPSULE | Freq: Three times a day (TID) | ORAL | Status: DC

## 2016-04-20 MED ORDER — IBUPROFEN 400 MG PO TABS
600.0000 mg | ORAL_TABLET | Freq: Once | ORAL | Status: AC
  Administered 2016-04-20: 600 mg via ORAL
  Filled 2016-04-20: qty 1

## 2016-04-20 MED ORDER — AMOXICILLIN 500 MG PO CAPS
500.0000 mg | ORAL_CAPSULE | Freq: Once | ORAL | Status: AC
  Administered 2016-04-20: 500 mg via ORAL
  Filled 2016-04-20: qty 1

## 2016-04-20 MED ORDER — NAPROXEN 500 MG PO TABS: 500.0000 mg | ORAL_TABLET | Freq: Two times a day (BID) | ORAL | Status: DC

## 2016-04-20 NOTE — ED Notes (Signed)
Pt c/o L sided sore throat & facial pain with redness to the throat, pt denies cough, pt denies injury to the face, pt has broken lower R tooth denies pain, swelling & drainage around the tooth

## 2016-04-20 NOTE — Discharge Instructions (Signed)
Return if your symptoms worsen

## 2016-04-20 NOTE — ED Provider Notes (Signed)
CSN: [id number]     Arrival date & time 04/20/16  1707 History  By signing my name below, I, Carrie Hampton, attest that this documentation has been prepared under the direction and in the presence of Carrie BuffaloHope Sayde Lish, NP. Electronically Signed: Placido SouLogan Hampton, ED Scribe. 04/20/2016. 5:49 PM.   Chief Complaint  Patient presents with  . Facial Pain   Patient is a 25 y.o. female presenting with general illness. The history is provided by the patient. No language interpreter was used.  Illness Location:  Left face Severity:  Moderate Onset quality:  Gradual Duration:  3 days Timing:  Constant Progression:  Worsening Chronicity:  New Ineffective treatments:  OTC pain relievers Associated symptoms: diarrhea, fatigue and headaches (facial pain)   Associated symptoms: no congestion, no cough, no fever, no nausea, no rhinorrhea and no vomiting     HPI Comments: Carrie Hampton is a 25 y.o. female who presents to the Emergency Department complaining of worsening, moderate, diffuse, left sided facial pain x 3 days. Pt states that it began with a slight HA and began to feel as if it was dental pain but denies an identifiable point of pain or worsening pain with palpation of the region. She reports associated chills, diarrhea and intermittent fatigue. She has taken OTC pain relievers and gargled salt water without significant relief. She denies rhinorrhea, congestion, fevers, vomiting, cough or any other associated symptoms at this time.    Past Medical History  Diagnosis Date  . No pertinent past medical history   . STD (female) 2013    Chlamydia   Past Surgical History  Procedure Laterality Date  . Tooth extraction     Family History  Problem Relation Age of Onset  . Alcohol abuse Neg Hx   . Arthritis Neg Hx   . Asthma Neg Hx   . Birth defects Neg Hx   . Cancer Neg Hx   . COPD Neg Hx   . Depression Neg Hx   . Diabetes Neg Hx   . Drug abuse Neg Hx   . Early death Neg Hx   . Hearing loss  Neg Hx   . Heart disease Neg Hx   . Hyperlipidemia Neg Hx   . Hypertension Neg Hx   . Kidney disease Neg Hx   . Learning disabilities Neg Hx   . Mental illness Neg Hx   . Mental retardation Neg Hx   . Miscarriages / Stillbirths Neg Hx   . Stroke Neg Hx   . Vision loss Neg Hx    Social History  Substance Use Topics  . Smoking status: Current Every Day Smoker -- 1.00 packs/day    Types: Cigars    Last Attempt to Quit: 10/20/2012  . Smokeless tobacco: Never Used  . Alcohol Use: No   OB History    Gravida Para Term Preterm AB TAB SAB Ectopic Multiple Living   4    2  2    0     Review of Systems  Constitutional: Positive for chills and fatigue. Negative for fever.  HENT: Negative for congestion and rhinorrhea.   Respiratory: Negative for cough.   Gastrointestinal: Positive for diarrhea. Negative for nausea and vomiting.  Neurological: Positive for headaches (facial pain).  All other systems reviewed and are negative.   Allergies  Review of patient's allergies indicates no known allergies.  Home Medications   Prior to Admission medications   Medication Sig Start Date End Date Taking? Authorizing Provider  amoxicillin (AMOXIL) 500  MG capsule Take 1 capsule (500 mg total) by mouth 3 (three) times daily. 04/20/16   Kent Braunschweig Orlene Och, NP  naproxen (NAPROSYN) 500 MG tablet Take 1 tablet (500 mg total) by mouth 2 (two) times daily. 04/20/16   Deantre Bourdon Orlene Och, NP  Prenat w/o A Vit-FeFum-FePo-FA (CONCEPT OB) 130-92.4-1 MG CAPS Take 1 tablet by mouth daily. 07/13/15   Dorathy Kinsman, CNM  promethazine (PHENERGAN) 25 MG tablet Take 0.5-1 tablets (12.5-25 mg total) by mouth every 6 (six) hours as needed. 07/16/15   Armando Reichert, CNM   BP 116/72 mmHg  Pulse 68  Temp(Src) 98.7 F (37.1 C) (Oral)  Resp 18  Ht  (1.6 m)  Wt 49.896 kg  BMI 19.49 kg/m2  SpO2 97%  LMP 03/21/2016  Breastfeeding? Unknown    Physical Exam  Constitutional: She is oriented to person, place, and time. She  appears well-developed and well-nourished.  HENT:  Head: Normocephalic and atraumatic.  Right Ear: Tympanic membrane, external ear and ear canal normal.  Left Ear: Tympanic membrane, external ear and ear canal normal.  Nose: Left sinus exhibits maxillary sinus tenderness and frontal sinus tenderness.  Mouth/Throat: Uvula is midline. Posterior oropharyngeal erythema present. No posterior oropharyngeal edema.  Eyes: Conjunctivae and EOM are normal. Pupils are equal, round, and reactive to light. Right eye exhibits no discharge. Left eye exhibits no discharge. No scleral icterus.  Neck: Normal range of motion.  Cardiovascular: Normal rate, regular rhythm and normal heart sounds.  Exam reveals no gallop and no friction rub.   No murmur heard. Pulmonary/Chest: Effort normal and breath sounds normal. No respiratory distress. She has no wheezes. She has no rales. She exhibits no tenderness.  Abdominal: Soft.  Musculoskeletal: Normal range of motion.  Neurological: She is alert and oriented to person, place, and time.  Skin: Skin is warm and dry.  Psychiatric: She has a normal mood and affect.  Nursing note and vitals reviewed.   ED Course  Procedures  DIAGNOSTIC STUDIES: Oxygen Saturation is 97% on RA, normal by my interpretation.    COORDINATION OF CARE: 5:46 PM Discussed next steps with pt. She verbalized understanding and is agreeable with the plan.   Labs Review Labs Reviewed  RAPID STREP SCREEN (NOT AT [facility])  CULTURE, GROUP A STREP [facility])     MDM  25 y.o. female with left side facial pain stable for d/c without facial cellulitis and no meningeal signs. Patient is afebrile and does not appear toxic. She does have left maxillary and frontal sinus tenderness. Will treat for sinusitis with Amoxicillin and with Naprosyn for pain.   Final diagnoses:  Acute maxillary sinusitis, recurrence not specified    I personally performed the services described in this documentation, which was  scribed in my presence. The recorded information has been reviewed and is accurate.   [address] [place], NP 04/21/16 1748  Carrie Baptist, MD 04/23/16 2023

## 2016-04-23 LAB — CULTURE, GROUP A STREP (THRC)

## 2016-05-11 ENCOUNTER — Encounter (HOSPITAL_COMMUNITY): Payer: Self-pay | Admitting: Emergency Medicine

## 2016-05-11 ENCOUNTER — Emergency Department (HOSPITAL_COMMUNITY): Payer: Self-pay

## 2016-05-11 ENCOUNTER — Emergency Department (HOSPITAL_COMMUNITY)
Admission: EM | Admit: 2016-05-11 | Discharge: 2016-05-11 | Disposition: A | Discharge status: Home / Self Care | Payer: Self-pay | Attending: Emergency Medicine | Admitting: Emergency Medicine

## 2016-05-11 DIAGNOSIS — B349 Viral infection, unspecified: Secondary | ICD-10-CM | POA: Insufficient documentation

## 2016-05-11 DIAGNOSIS — I10 Essential (primary) hypertension: Secondary | ICD-10-CM | POA: Insufficient documentation

## 2016-05-11 DIAGNOSIS — R42 Dizziness and giddiness: Secondary | ICD-10-CM

## 2016-05-11 DIAGNOSIS — F17210 Nicotine dependence, cigarettes, uncomplicated: Secondary | ICD-10-CM | POA: Insufficient documentation

## 2016-05-11 DIAGNOSIS — Z79899 Other long term (current) drug therapy: Secondary | ICD-10-CM | POA: Insufficient documentation

## 2016-05-11 HISTORY — DX: Essential (primary) hypertension: I10

## 2016-05-11 LAB — BASIC METABOLIC PANEL
ANION GAP: 8 (ref 5–15)
BUN: 7 mg/dL (ref 6–20)
CALCIUM: 8.8 mg/dL — AB (ref 8.9–10.3)
CO2: 25 mmol/L (ref 22–32)
Chloride: 105 mmol/L (ref 101–111)
Creatinine, Ser: 0.77 mg/dL (ref 0.44–1.00)
Glucose, Bld: 85 mg/dL (ref 65–99)
Potassium: 3.4 mmol/L — ABNORMAL LOW (ref 3.5–5.1)
Sodium: 138 mmol/L (ref 135–145)

## 2016-05-11 LAB — URINE MICROSCOPIC-ADD ON: RBC / HPF: NONE SEEN RBC/hpf (ref 0–5)

## 2016-05-11 LAB — URINALYSIS, ROUTINE W REFLEX MICROSCOPIC
Bilirubin Urine: NEGATIVE
Glucose, UA: NEGATIVE mg/dL
Hgb urine dipstick: NEGATIVE
Ketones, ur: 40 mg/dL — AB
Leukocytes, UA: NEGATIVE
NITRITE: NEGATIVE
Protein, ur: 30 mg/dL — AB
SPECIFIC GRAVITY, URINE: 1.03 (ref 1.005–1.030)
pH: 6 (ref 5.0–8.0)

## 2016-05-11 LAB — CBC
HCT: 43.7 % (ref 36.0–46.0)
HEMOGLOBIN: 14.7 g/dL (ref 12.0–15.0)
MCH: 29.2 pg (ref 26.0–34.0)
MCHC: 33.6 g/dL (ref 30.0–36.0)
MCV: 86.9 fL (ref 78.0–100.0)
Platelets: 159 10*3/uL (ref 150–400)
RBC: 5.03 MIL/uL (ref 3.87–5.11)
RDW: 12 % (ref 11.5–15.5)
WBC: 4.7 10*3/uL (ref 4.0–10.5)

## 2016-05-11 LAB — PREGNANCY, URINE: PREG TEST UR: NEGATIVE

## 2016-05-11 MED ORDER — SODIUM CHLORIDE 0.9 % IV BOLUS (SEPSIS)
1000.0000 mL | Freq: Once | INTRAVENOUS | Status: AC
  Administered 2016-05-11: 1000 mL via INTRAVENOUS

## 2016-05-11 MED ORDER — ONDANSETRON 4 MG PO TBDP: 4.0000 mg | ORAL_TABLET | Freq: Three times a day (TID) | ORAL | Status: DC | PRN

## 2016-05-11 MED ORDER — NAPROXEN 250 MG PO TABS: 250.0000 mg | ORAL_TABLET | Freq: Two times a day (BID) | ORAL | Status: DC

## 2016-05-11 MED ORDER — ACETAMINOPHEN 500 MG PO TABS
1000.0000 mg | ORAL_TABLET | Freq: Once | ORAL | Status: AC
  Administered 2016-05-11: 1000 mg via ORAL
  Filled 2016-05-11: qty 2

## 2016-05-11 MED ORDER — ONDANSETRON HCL 4 MG/2ML IJ SOLN
4.0000 mg | Freq: Once | INTRAMUSCULAR | Status: AC
  Administered 2016-05-11: 4 mg via INTRAVENOUS
  Filled 2016-05-11: qty 2

## 2016-05-11 NOTE — ED Notes (Signed)
Patient states lightheadedness "I feel like I'm gonna pass out".  Patient states "I get all over weak sometimes".   Patient states generalized body aches.   Patient states N/V/D.   Patient states no appetite.   Patient claims headache.  Patient states multiple symptoms for several days.

## 2016-05-11 NOTE — Discharge Instructions (Signed)
Upper Respiratory Infection, Adult °Most upper respiratory infections (URIs) are a viral infection of the air passages leading to the lungs. A URI affects the nose, throat, and upper air passages. The most common type of URI is nasopharyngitis and is typically referred to as "the common cold." °URIs run their course and usually go away on their own. Most of the time, a URI does not require medical attention, but sometimes a bacterial infection in the upper airways can follow a viral infection. This is called a secondary infection. Sinus and middle ear infections are common types of secondary upper respiratory infections. °Bacterial pneumonia can also complicate a URI. A URI can worsen asthma and chronic obstructive pulmonary disease (COPD). Sometimes, these complications can require emergency medical care and may be life threatening.  °CAUSES °Almost all URIs are caused by viruses. A virus is a type of germ and can spread from one person to another.  °RISKS FACTORS °You may be at risk for a URI if:  °· You smoke.   °· You have chronic heart or lung disease. °· You have a weakened defense (immune) system.   °· You are very young or very old.   °· You have nasal allergies or asthma. °· You work in crowded or poorly ventilated areas. °· You work in health care facilities or schools. °SIGNS AND SYMPTOMS  °Symptoms typically develop 2-3 days after you come in contact with a cold virus. Most viral URIs last 7-10 days. However, viral URIs from the influenza virus (flu virus) can last 14-18 days and are typically more severe. Symptoms may include:  °· Runny or stuffy (congested) nose.   °· Sneezing.   °· Cough.   °· Sore throat.   °· Headache.   °· Fatigue.   °· Fever.   °· Loss of appetite.   °· Pain in your forehead, behind your eyes, and over your cheekbones (sinus pain). °· Muscle aches.   °DIAGNOSIS  °Your health care provider may diagnose a URI by: °· Physical exam. °· Tests to check that your symptoms are not due to  another condition such as: °· Strep throat. °· Sinusitis. °· Pneumonia. °· Asthma. °TREATMENT  °A URI goes away on its own with time. It cannot be cured with medicines, but medicines may be prescribed or recommended to relieve symptoms. Medicines may help: °· Reduce your fever. °· Reduce your cough. °· Relieve nasal congestion. °HOME CARE INSTRUCTIONS  °· Take medicines only as directed by your health care provider.   °· Gargle warm saltwater or take cough drops to comfort your throat as directed by your health care provider. °· Use a warm mist humidifier or inhale steam from a shower to increase air moisture. This may make it easier to breathe. °· Drink enough fluid to keep your urine clear or pale yellow.   °· Eat soups and other clear broths and maintain good nutrition.   °· Rest as needed.   °· Return to work when your temperature has returned to normal or as your health care provider advises. You may need to stay home longer to avoid infecting others. You can also use a face mask and careful hand washing to prevent spread of the virus. °· Increase the usage of your inhaler if you have asthma.   °· Do not use any tobacco products, including cigarettes, chewing tobacco, or electronic cigarettes. If you need help quitting, ask your health care provider. °PREVENTION  °The best way to protect yourself from getting a cold is to practice good hygiene.  °· Avoid oral or hand contact with people with cold   symptoms.   °· Wash your hands often if contact occurs.   °There is no clear evidence that vitamin C, vitamin E, echinacea, or exercise reduces the chance of developing a cold. However, it is always recommended to get plenty of rest, exercise, and practice good nutrition.  °SEEK MEDICAL CARE IF:  °· You are getting worse rather than better.   °· Your symptoms are not controlled by medicine.   °· You have chills. °· You have worsening shortness of breath. °· You have brown or red mucus. °· You have yellow or brown nasal  discharge. °· You have pain in your face, especially when you bend forward. °· You have a fever. °· You have swollen neck glands. °· You have pain while swallowing. °· You have white areas in the back of your throat. °SEEK IMMEDIATE MEDICAL CARE IF:  °· You have severe or persistent: °¨ Headache. °¨ Ear pain. °¨ Sinus pain. °¨ Chest pain. °· You have chronic lung disease and any of the following: °¨ Wheezing. °¨ Prolonged cough. °¨ Coughing up blood. °¨ A change in your usual mucus. °· You have a stiff neck. °· You have changes in your: °¨ Vision. °¨ Hearing. °¨ Thinking. °¨ Mood. °MAKE SURE YOU:  °· Understand these instructions. °· Will watch your condition. °· Will get help right away if you are not doing well or get worse. °  °This information is not intended to replace advice given to you by your health care provider. Make sure you discuss any questions you have with your health care provider. °  °Document Released: 05/09/2001 Document Revised: 03/30/2015 Document Reviewed: 02/18/2014 °Elsevier Interactive Patient Education ©2016 Else[employer] Inc[employer] °Dizziness °Dizziness is a common problem. It is a feeling of unsteadiness or light-headedness. You may feel like you are about to faint. Dizziness can lead to injury if you stumble or fall. Anyone can become dizzy, but dizziness is more common in older adults. This condition can be caused by a number of things, including medicines, dehydration, or illness. °HOME CARE INSTRUCTIONS °Taking these steps may help with your condition: °Eating and Drinking °· Drink enough fluid to keep your urine clear or pale yellow. This helps to keep you from becoming dehydrated. Try to drink more clear fluids, such as water. °· Do not drink alcohol. °· Limit your caffeine intake if directed by your health care provider. °· Limit your salt intake if directed by your health care provider. °Activity °· Avoid making quick movements. °· Rise slowly from chairs and steady yourself until you feel  okay. °· In the morning, first sit up on the side of the bed. When you feel okay, stand slowly while you hold onto something until you know that your balance is fine. °· Move your legs often if you need to stand in one place for a long time. Tighten and relax your muscles in your legs while you are standing. °· Do not drive or operate heavy machinery if you feel dizzy. °· Avoid bending down if you feel dizzy. Place items in your home so that they are easy for you to reach without leaning over. °Lifestyle °· Do not use any tobacco products, including cigarettes, chewing tobacco, or electronic cigarettes. If you need help quitting, ask your health care provider. °· Try to reduce your stress level, such as with yoga or meditation. Talk with your health care provider if you need help. °General Instructions °· Watch your dizziness for any changes. °· Take medicines only as directed by your health care   care provider. Talk with your health care provider if you think that your dizziness is caused by a medicine that you are taking.  Tell a friend or a family member that you are feeling dizzy. If he or she notices any changes in your behavior, have this person call your health care provider.  Keep all follow-up visits as directed by your health care provider. This is important. SEEK MEDICAL CARE IF:  Your dizziness does not go away.  Your dizziness or light-headedness gets worse.  You feel nauseous.  You have reduced hearing.  You have new symptoms.  You are unsteady on your feet or you feel like the room is spinning. SEEK IMMEDIATE MEDICAL CARE IF:  You vomit or have diarrhea and are unable to eat or drink anything.  You have problems talking, walking, swallowing, or using your arms, hands, or legs.  You feel generally weak.  You are not thinking clearly or you have trouble forming sentences. It may take a friend or family member to notice this.  You have chest pain, abdominal pain, shortness of  breath, or sweating.  Your vision changes.  You notice any bleeding.  You have a headache.  You have neck pain or a stiff neck.  You have a fever.   This information is not intended to replace advice given to you by your health care provider. Make sure you discuss any questions you have with your health care provider.   Document Released: 05/09/2001 Document Revised: 03/30/2015 Document Reviewed: 11/09/2014 Elsevier Interactive Patient Education [employer]2016 Elsevier Inc.

## 2016-05-11 NOTE — ED Provider Notes (Signed)
CSN: [id number]     Arrival date & time 05/11/16  1441 History   First MD Initiated Contact with Patient 05/11/16 1648     Chief Complaint  Patient presents with  . Near Syncope    Carrie Hampton is a 25 y.o. female who presents to the emergency department complaining of flulike symptoms with generalized body aches, sneezing, coughing, headache. Today she has been feeling lightheaded with position change. She feels like she might pass out when she stands up. She has not passed out today. She reports decreased appetite. She was having some nausea earlier but no vomiting. She reports having an episode of diarrhea today. She has taken nothing for treatment of her symptoms today. The patient denies abdominal pain, vomiting, hemoptysis, shortness of breath, sore throat, urinary symptoms, hematuria, or rashes.  Patient is a 25 y.o. female presenting with near-syncope. The history is provided by the patient. No language interpreter was used.  Near Syncope Associated symptoms include chills, congestion, coughing, fatigue, a fever, headaches and myalgias. Pertinent negatives include no abdominal pain, chest pain, nausea, neck pain, numbness, rash, sore throat, vomiting or weakness.    Past Medical History  Diagnosis Date  . No pertinent past medical history   . STD (female) 2013    Chlamydia  . Hypertension    Past Surgical History  Procedure Laterality Date  . Tooth extraction     Family History  Problem Relation Age of Onset  . Alcohol abuse Neg Hx   . Arthritis Neg Hx   . Asthma Neg Hx   . Birth defects Neg Hx   . Cancer Neg Hx   . COPD Neg Hx   . Depression Neg Hx   . Diabetes Neg Hx   . Drug abuse Neg Hx   . Early death Neg Hx   . Hearing loss Neg Hx   . Heart disease Neg Hx   . Hyperlipidemia Neg Hx   . Hypertension Neg Hx   . Kidney disease Neg Hx   . Learning disabilities Neg Hx   . Mental illness Neg Hx   . Mental retardation Neg Hx   . Miscarriages / Stillbirths Neg Hx    . Stroke Neg Hx   . Vision loss Neg Hx    Social History  Substance Use Topics  . Smoking status: Current Every Day Smoker -- 1.00 packs/day    Types: Cigars    Last Attempt to Quit: 10/20/2012  . Smokeless tobacco: Never Used  . Alcohol Use: Yes     Comment: socially   OB History    Gravida Para Term Preterm AB TAB SAB Ectopic Multiple Living   4    2  2    0     Review of Systems  Constitutional: Positive for fever, chills, appetite change and fatigue.  HENT: Positive for congestion, postnasal drip and rhinorrhea. Negative for sore throat and trouble swallowing.   Eyes: Negative for visual disturbance.  Respiratory: Positive for cough. Negative for shortness of breath and wheezing.   Cardiovascular: Positive for near-syncope. Negative for chest pain.  Gastrointestinal: Negative for nausea, vomiting, abdominal pain and diarrhea.  Genitourinary: Negative for dysuria, hematuria, vaginal bleeding, vaginal discharge and difficulty urinating.  Musculoskeletal: Positive for myalgias. Negative for back pain and neck pain.  Skin: Negative for rash.  Neurological: Positive for light-headedness and headaches. Negative for syncope, weakness and numbness.      Allergies  Review of patient's allergies indicates no known allergies.  Home  Medications   Prior to Admission medications   Medication Sig Start Date End Date Taking? Authorizing Provider  amoxicillin (AMOXIL) 500 MG capsule Take 1 capsule (500 mg total) by mouth 3 (three) times daily. 04/20/16  Yes Hope Orlene OchM Neese, NP  naproxen (NAPROSYN) 250 MG tablet Take 1 tablet (250 mg total) by mouth 2 (two) times daily with a meal. 05/11/16   Everlene FarrierWilliam Dannie Woolen, PA-C  ondansetron (ZOFRAN ODT) 4 MG disintegrating tablet Take 1 tablet (4 mg total) by mouth every 8 (eight) hours as needed for nausea or vomiting. 05/11/16   Everlene FarrierWilliam Marrie Chandra, PA-C  Prenat w/o A Vit-FeFum-FePo-FA (CONCEPT OB) 130-92.4-1 MG CAPS Take 1 tablet by mouth daily. 07/13/15    Dorathy KinsmanVirginia Smith, CNM  promethazine (PHENERGAN) 25 MG tablet Take 0.5-1 tablets (12.5-25 mg total) by mouth every 6 (six) hours as needed. 07/16/15   Heather D Mathews RobinsonsHogan, CNM   BP 108/76 mmHg  Pulse 72  Temp(Src) 98.2 F (36.8 C) (Oral)  Resp 18  SpO2 100%  LMP 05/04/2016 Physical Exam  Constitutional: She is oriented to person, place, and time. She appears well-developed and well-nourished. No distress.  Nontoxic appearing.  HENT:  Head: Normocephalic and atraumatic.  Right Ear: External ear normal.  Left Ear: External ear normal.  Mouth/Throat: Oropharynx is clear and moist.  Throat is clear.  Eyes: Conjunctivae are normal. Pupils are equal, round, and reactive to light. Right eye exhibits no discharge. Left eye exhibits no discharge.  Neck: Normal range of motion. Neck supple. No JVD present. No tracheal deviation present.  Cardiovascular: Normal rate, regular rhythm, normal heart sounds and intact distal pulses.  Exam reveals no gallop and no friction rub.   No murmur heard. Pulmonary/Chest: Effort normal and breath sounds normal. No stridor. No respiratory distress. She has no wheezes. She has no rales.  Lungs clear to auscultation bilaterally.  Abdominal: Soft. Bowel sounds are normal. She exhibits no distension. There is no tenderness. There is no guarding.  Abdomen is soft and nontender to palpation.  Musculoskeletal: She exhibits no edema or tenderness.  Lymphadenopathy:    She has no cervical adenopathy.  Neurological: She is alert and oriented to person, place, and time. No cranial nerve deficit. Coordination normal.  Patient is alert and oriented 3. Cranial nerves are intact. Speech is clear and coherent. Normal gait.  Skin: Skin is warm and dry. No rash noted. She is not diaphoretic. No erythema. No pallor.  Psychiatric: She has a normal mood and affect. Her behavior is normal.  Nursing note and vitals reviewed.   ED Course  Procedures (including critical care time) Labs  Review Labs Reviewed  BASIC METABOLIC PANEL - Abnormal; Notable for the following:    Potassium 3.4 (*)    Calcium 8.8 (*)    All other components within normal limits  URINALYSIS, ROUTINE W REFLEX MICROSCOPIC (NOT AT [facility]) - Abnormal; Notable for the following:    APPearance CLOUDY (*)    Ketones, ur 40 (*)    Protein, ur 30 (*)    All other components within normal limits  URINE MICROSCOPIC-ADD ON - Abnormal; Notable for the following:    Squamous Epithelial / LPF 6-30 (*)    Bacteria, UA MANY (*)    All other components within normal limits  CBC  PREGNANCY, URINE  CBG MONITORING, ED    Imaging Review Dg Chest 2 View  05/11/2016  CLINICAL DATA:  Productive cough and shortness of breath EXAM: CHEST  2 VIEW COMPARISON:  None.  FINDINGS: The heart size and mediastinal contours are within normal limits. Both lungs are clear. The visualized skeletal structures are unremarkable. IMPRESSION: No active cardiopulmonary disease. Electronically Signed   By: Alcide Clever M.D.   On: 05/11/2016 18:12   I have personally reviewed and evaluated these images and lab results as part of my medical decision-making.   EKG Interpretation   Date/Time:  Thursday May 11 2016 15:16:21 EDT Ventricular Rate:  95 PR Interval:  136 QRS Duration: 84 QT Interval:  340 QTC Calculation: 427 R Axis:   93 Text Interpretation:  Normal sinus rhythm Rightward axis Borderline ECG No  previous ECGs available Confirmed by LITTLE MD, RACHEL [phone]) on  05/11/2016 4:40:41 PM      Filed Vitals:   05/11/16 1712 05/11/16 1841 05/11/16 1924 05/11/16 2037  BP: 122/82 118/76 100/69 108/76  Pulse:  85 74 72  Temp:  99 F (37.2 C)  98.2 F (36.8 C)  TempSrc:  Oral  Oral  Resp: SpO2:  100% 96% 100%     MDM   Meds given in ED:  Medications  sodium chloride 0.9 % bolus 1,000 mL (0 mLs Intravenous Stopped 05/11/16 2036)  ondansetron (ZOFRAN) injection 4 mg (4 mg Intravenous Given 05/11/16 1747)   acetaminophen (TYLENOL) tablet 1,000 mg (1,000 mg Oral Given 05/11/16 1747)    Discharge Medication List as of 05/11/2016  8:39 PM    START taking these medications   Details  ondansetron (ZOFRAN ODT) 4 MG disintegrating tablet Take 1 tablet (4 mg total) by mouth every 8 (eight) hours as needed for nausea or vomiting., Starting 05/11/2016, Until Discontinued, Print        Final diagnoses:  Viral syndrome  Lightheaded   This is a 25 y.o. female who presents to the emergency department complaining of flulike symptoms with generalized body aches, sneezing, coughing, headache. Today she has been feeling lightheaded with position change. She feels like she might pass out when she stands up. She has not passed out today. She reports decreased appetite. She was having some nausea earlier but no vomiting. She reports having an episode of diarrhea today. She has taken nothing for treatment of her symptoms today. The patient denies abdominal pain, vomiting. She denies lower belly pain, vaginal bleeding or vaginal discharge. She denies urinary symptoms. On exam the patient is afebrile nontoxic appearing. Lungs clear to auscultation bilaterally. She has no focal neurological deficits. Abdomen is soft and nontender to palpation. Pregnancy test is negative. Urinalysis shows no signs of infection. BMP and CBC are unremarkable. Chest x-ray is unremarkable. Patient received Tylenol and fluid bolus. She is tolerating PO.  At reevaluation after fluid patient reports she is feeling ready to go home. She is ambulating with normal gait.  I advised she could use naproxen for body aches and subjective fevers. Zofran as needed for nausea. I discussed return precautions.     Everlene Farrier, PA-C 05/11/16 2054  Laurence Spates, MD 05/22/16 1302

## 2016-08-16 ENCOUNTER — Encounter (HOSPITAL_COMMUNITY): Payer: Self-pay | Admitting: *Deleted

## 2016-08-16 ENCOUNTER — Inpatient Hospital Stay (HOSPITAL_COMMUNITY)
Admission: AD | Admit: 2016-08-16 | Discharge: 2016-08-16 | Disposition: A | Discharge status: Home / Self Care | Payer: Self-pay | Source: Ambulatory Visit | Attending: Obstetrics and Gynecology | Admitting: Obstetrics and Gynecology

## 2016-08-16 DIAGNOSIS — R109 Unspecified abdominal pain: Secondary | ICD-10-CM | POA: Insufficient documentation

## 2016-08-16 DIAGNOSIS — Z811 Family history of alcohol abuse and dependence: Secondary | ICD-10-CM | POA: Insufficient documentation

## 2016-08-16 DIAGNOSIS — F17200 Nicotine dependence, unspecified, uncomplicated: Secondary | ICD-10-CM | POA: Insufficient documentation

## 2016-08-16 DIAGNOSIS — Z809 Family history of malignant neoplasm, unspecified: Secondary | ICD-10-CM | POA: Insufficient documentation

## 2016-08-16 DIAGNOSIS — Z8261 Family history of arthritis: Secondary | ICD-10-CM | POA: Insufficient documentation

## 2016-08-16 DIAGNOSIS — B9689 Other specified bacterial agents as the cause of diseases classified elsewhere: Secondary | ICD-10-CM

## 2016-08-16 DIAGNOSIS — A499 Bacterial infection, unspecified: Secondary | ICD-10-CM

## 2016-08-16 DIAGNOSIS — Z8249 Family history of ischemic heart disease and other diseases of the circulatory system: Secondary | ICD-10-CM | POA: Insufficient documentation

## 2016-08-16 DIAGNOSIS — Z8619 Personal history of other infectious and parasitic diseases: Secondary | ICD-10-CM | POA: Insufficient documentation

## 2016-08-16 DIAGNOSIS — K219 Gastro-esophageal reflux disease without esophagitis: Secondary | ICD-10-CM | POA: Insufficient documentation

## 2016-08-16 DIAGNOSIS — Z823 Family history of stroke: Secondary | ICD-10-CM | POA: Insufficient documentation

## 2016-08-16 DIAGNOSIS — N760 Acute vaginitis: Secondary | ICD-10-CM | POA: Insufficient documentation

## 2016-08-16 DIAGNOSIS — I10 Essential (primary) hypertension: Secondary | ICD-10-CM | POA: Insufficient documentation

## 2016-08-16 DIAGNOSIS — Z3202 Encounter for pregnancy test, result negative: Secondary | ICD-10-CM | POA: Insufficient documentation

## 2016-08-16 DIAGNOSIS — Z833 Family history of diabetes mellitus: Secondary | ICD-10-CM | POA: Insufficient documentation

## 2016-08-16 DIAGNOSIS — N898 Other specified noninflammatory disorders of vagina: Secondary | ICD-10-CM | POA: Insufficient documentation

## 2016-08-16 LAB — URINALYSIS, ROUTINE W REFLEX MICROSCOPIC
Bilirubin Urine: NEGATIVE
Glucose, UA: NEGATIVE mg/dL
Hgb urine dipstick: NEGATIVE
Ketones, ur: NEGATIVE mg/dL
NITRITE: NEGATIVE
PH: 6.5 (ref 5.0–8.0)
Protein, ur: NEGATIVE mg/dL
SPECIFIC GRAVITY, URINE: 1.02 (ref 1.005–1.030)

## 2016-08-16 LAB — URINE MICROSCOPIC-ADD ON

## 2016-08-16 LAB — WET PREP, GENITAL
SPERM: NONE SEEN
TRICH WET PREP: NONE SEEN
Yeast Wet Prep HPF POC: NONE SEEN

## 2016-08-16 LAB — POCT PREGNANCY, URINE: Preg Test, Ur: NEGATIVE

## 2016-08-16 MED ORDER — RANITIDINE HCL 150 MG PO TABS: 150.0000 mg | ORAL_TABLET | Freq: Two times a day (BID) | ORAL | 1 refills | Status: DC

## 2016-08-16 MED ORDER — FAMOTIDINE 20 MG PO TABS
40.0000 mg | ORAL_TABLET | Freq: Once | ORAL | Status: AC
  Administered 2016-08-16: 40 mg via ORAL
  Filled 2016-08-16: qty 2

## 2016-08-16 MED ORDER — METRONIDAZOLE 500 MG PO TABS
500.0000 mg | ORAL_TABLET | Freq: Two times a day (BID) | ORAL | 0 refills | Status: DC
Start: 2016-08-16 — End: 2017-02-15

## 2016-08-16 NOTE — MAU Provider Note (Signed)
History     CSN: [id number]  Arrival date and time: 08/16/16 1003   First Provider Initiated Contact with Patient 08/16/16 1115      Chief Complaint  Patient presents with  . Abdominal Pain  . Vaginal Discharge   HPI   Ms.Carrie Hampton is a 25 y.o. non-pregnant female [id number] here with abdominal pain and vaginal discharge. The abdominal pain is located in her upper-middle portion of her stomach. "it feels like a sharp pain". She did not eat breakfast this morning because she does not have an appetite. She works at [employer]; she does eat a lot of fast food normally. She does eat a lot of fried foods also. She drinks one bottle of water per day and drinks 3-4 sodas per day.    OB History    Gravida Para Term Preterm AB Living   4       2 0   SAB TAB Ectopic Multiple Live Births   2              Past Medical History:  Diagnosis Date  . Hypertension   . No pertinent past medical history   . STD (female) 2013   Chlamydia    Past Surgical History:  Procedure Laterality Date  . TOOTH EXTRACTION      Family History  Problem Relation Age of Onset  . Alcohol abuse Neg Hx   . Arthritis Neg Hx   . Asthma Neg Hx   . Birth defects Neg Hx   . Cancer Neg Hx   . COPD Neg Hx   . Depression Neg Hx   . Diabetes Neg Hx   . Drug abuse Neg Hx   . Early death Neg Hx   . Hearing loss Neg Hx   . Heart disease Neg Hx   . Hyperlipidemia Neg Hx   . Hypertension Neg Hx   . Kidney disease Neg Hx   . Learning disabilities Neg Hx   . Mental illness Neg Hx   . Mental retardation Neg Hx   . Miscarriages / Stillbirths Neg Hx   . Stroke Neg Hx   . Vision loss Neg Hx     Social History  Substance Use Topics  . Smoking status: Current Every Day Smoker    Packs/day: 1.00    Types: Cigars    Last attempt to quit: 10/20/2012  . Smokeless tobacco: Never Used  . Alcohol use Yes     Comment: socially    Allergies: No Known Allergies  Prescriptions Prior to Admission  Medication  Sig Dispense Refill Last Dose  . amoxicillin (AMOXIL) 500 MG capsule Take 1 capsule (500 mg total) by mouth 3 (three) times daily. (Patient not taking: Reported on 08/16/2016) 30 capsule 0 Not Taking at Unknown time  . naproxen (NAPROSYN) 250 MG tablet Take 1 tablet (250 mg total) by mouth 2 (two) times daily with a meal. (Patient not taking: Reported on 08/16/2016) 30 tablet 0 Not Taking at Unknown time  . ondansetron (ZOFRAN ODT) 4 MG disintegrating tablet Take 1 tablet (4 mg total) by mouth every 8 (eight) hours as needed for nausea or vomiting. (Patient not taking: Reported on 08/16/2016) 10 tablet 0 Not Taking at Unknown time  . Prenat w/o A Vit-FeFum-FePo-FA (CONCEPT OB) 130-92.4-1 MG CAPS Take 1 tablet by mouth daily. (Patient not taking: Reported on 08/16/2016) 30 capsule 12 Not Taking at Unknown time  . promethazine (PHENERGAN) 25 MG tablet Take 0.5-1 tablets (12.5-25 mg  total) by mouth every 6 (six) hours as needed. (Patient not taking: Reported on 08/16/2016) 30 tablet 0 Not Taking at Unknown time   Results for orders placed or performed during the hospital encounter of 08/16/16 (from the past 48 hour(s))  Urinalysis, Routine w reflex microscopic (not at [facility])     Status: Abnormal   Collection Time: 08/16/16 10:20 AM  Result Value Ref Range   Color, Urine YELLOW YELLOW   APPearance CLEAR CLEAR   Specific Gravity, Urine 1.020 1.005 - 1.030   pH 6.5 5.0 - 8.0   Glucose, UA NEGATIVE NEGATIVE mg/dL   Hgb urine dipstick NEGATIVE NEGATIVE   Bilirubin Urine NEGATIVE NEGATIVE   Ketones, ur NEGATIVE NEGATIVE mg/dL   Protein, ur NEGATIVE NEGATIVE mg/dL   Nitrite NEGATIVE NEGATIVE   Leukocytes, UA TRACE (A) NEGATIVE  Urine microscopic-add on     Status: Abnormal   Collection Time: 08/16/16 10:20 AM  Result Value Ref Range   Squamous Epithelial / LPF 6-30 (A) NONE SEEN   WBC, UA 0-5 0 - 5 WBC/hpf   RBC / HPF 0-5 0 - 5 RBC/hpf   Bacteria, UA FEW (A) NONE SEEN  Pregnancy, urine POC     Status:  None   Collection Time: 08/16/16 10:27 AM  Result Value Ref Range   Preg Test, Ur NEGATIVE NEGATIVE    Comment:        THE SENSITIVITY OF THIS METHODOLOGY IS >24 mIU/mL   Wet prep, genital     Status: Abnormal   Collection Time: 08/16/16 11:28 AM  Result Value Ref Range   Yeast Wet Prep HPF POC NONE SEEN NONE SEEN   Trich, Wet Prep NONE SEEN NONE SEEN   Clue Cells Wet Prep HPF POC PRESENT (A) NONE SEEN   WBC, Wet Prep HPF POC FEW (A) NONE SEEN    Comment: MODERATE BACTERIA SEEN   Sperm NONE SEEN     Review of Systems  Constitutional: Negative for chills and fever.  Gastrointestinal: Positive for abdominal pain (Upper abdominal pain. ), heartburn and nausea (Occasional ). Negative for vomiting.   Physical Exam   Blood pressure 113/67, pulse 92, temperature 98.1 F (36.7 C), temperature source Oral, resp. rate 16, weight 108 lb (49 kg), last menstrual period 07/16/2016, unknown if currently breastfeeding.  Physical Exam  Constitutional: She is oriented to person, place, and time. She appears well-developed and well-nourished. No distress.  HENT:  Head: Normocephalic.  Eyes: Pupils are equal, round, and reactive to light.  Neck: Neck supple.  Respiratory: Effort normal.  GI: Soft. Normal appearance. There is tenderness in the epigastric area. There is no rigidity, no rebound and no guarding.  Musculoskeletal: Normal range of motion.  Neurological: She is alert and oriented to person, place, and time.  Skin: Skin is warm. She is not diaphoretic.  Psychiatric: Her behavior is normal.    MAU Course  Procedures  None  MDM  Wet prep GC Pepcid in MAU X 1 dose   Assessment and Plan   A:  1. Gastroesophageal reflux disease, esophagitis presence not specified   2. Bacterial vaginosis     P:  Discharge home in stable condition Rx: Zantac, Flagyl Return to MAU for emergencies Discussed importance of PCP GERD diet discussed, avoid soda's   Duane LopeJennifer I Rasch,  NP 08/16/2016 2:54 PM

## 2016-08-16 NOTE — MAU Note (Signed)
Urine in lab 

## 2016-08-16 NOTE — MAU Note (Signed)
Stomach has been hurting for past 3 days, mid upper abd, doesn't know why.  Noted a d/c past wk

## 2016-08-16 NOTE — Discharge Instructions (Signed)
Bacterial Vaginosis Bacterial vaginosis is an infection of the vagina. It happens when too many germs (bacteria) grow in the vagina. Having this infection puts you at risk for getting other infections from sex. Treating this infection can help lower your risk for other infections, such as:   Chlamydia.  Gonorrhea.  HIV.  Herpes. HOME CARE  Take your medicine as told by your doctor.  Finish your medicine even if you start to feel better.  Tell your sex partner that you have an infection. They should see their doctor for treatment.  During treatment:  Avoid sex or use condoms correctly.  Do not douche.  Do not drink alcohol unless your doctor tells you it is ok.  Do not breastfeed unless your doctor tells you it is ok. GET HELP IF:  You are not getting better after 3 days of treatment.  You have more grey fluid (discharge) coming from your vagina than before.  You have more pain than before.  You have a fever. MAKE SURE YOU:   Understand these instructions.  Will watch your condition.  Will get help right away if you are not doing well or get worse.   This information is not intended to replace advice given to you by your health care provider. Make sure you discuss any questions you have with your health care provider.   Document Released: 08/22/2008 Document Revised: 12/04/2014 Document Reviewed: 06/25/2013 Elsevier Interactive Patient Education 2016 [employer] Food Choices for Gastroesophageal Reflux Disease, Adult When you have gastroesophageal reflux disease (GERD), the foods you eat and your eating habits are very important. Choosing the right foods can help ease the discomfort of GERD. WHAT GENERAL GUIDELINES DO I NEED TO FOLLOW?  Choose fruits, vegetables, whole grains, low-fat dairy products, and low-fat meat, fish, and poultry.  Limit fats such as oils, salad dressings, butter, nuts, and avocado.  Keep a food diary to identify foods that cause  symptoms.  Avoid foods that cause reflux. These may be different for different people.  Eat frequent small meals instead of three large meals each day.  Eat your meals slowly, in a relaxed setting.  Limit fried foods.  Cook foods using methods other than frying.  Avoid drinking alcohol.  Avoid drinking large amounts of liquids with your meals.  Avoid bending over or lying down until 2-3 hours after eating. WHAT FOODS ARE NOT RECOMMENDED? The following are some foods and drinks that may worsen your symptoms: Vegetables Tomatoes. Tomato juice. Tomato and spaghetti sauce. Chili peppers. Onion and garlic. Horseradish. Fruits Oranges, grapefruit, and lemon (fruit and juice). Meats High-fat meats, fish, and poultry. This includes hot dogs, ribs, ham, sausage, salami, and bacon. Dairy Whole milk and chocolate milk. Sour cream. Cream. Butter. Ice cream. Cream cheese.  Beverages Coffee and tea, with or without caffeine. Carbonated beverages or energy drinks. Condiments Hot sauce. Barbecue sauce.  Sweets/Desserts Chocolate and cocoa. Donuts. Peppermint and spearmint. Fats and Oils High-fat foods, including [place] fries and potato chips. Other Vinegar. Strong spices, such as black pepper, white pepper, red pepper, cayenne, curry powder, cloves, ginger, and chili powder. The items listed above may not be a complete list of foods and beverages to avoid. Contact your dietitian for more information.   This information is not intended to replace advice given to you by your health care provider. Make sure you discuss any questions you have with your health care provider.   Document Released: 11/13/2005 Document Revised: 12/04/2014 Document Reviewed: 09/17/2013 Elsevier Interactive Patient  Education ©2016 Elsevier Inc[employer] ° °

## 2016-08-17 LAB — GC/CHLAMYDIA PROBE AMP (~~LOC~~) NOT AT ARMC
Chlamydia: NEGATIVE
Neisseria Gonorrhea: POSITIVE — AB

## 2016-08-18 ENCOUNTER — Telehealth: Payer: Self-pay | Admitting: Advanced Practice Midwife

## 2016-08-18 NOTE — Telephone Encounter (Signed)
Pt called for GC/Chlamydia culture results. Notified of pos Gonorrhea. [facility]. Partner has already been Tx'd. No sex for 1 week after both have been Tx'd.

## 2016-12-03 ENCOUNTER — Encounter (HOSPITAL_COMMUNITY): Payer: Self-pay

## 2016-12-03 ENCOUNTER — Emergency Department (HOSPITAL_COMMUNITY)
Admission: EM | Admit: 2016-12-03 | Discharge: 2016-12-03 | Disposition: A | Discharge status: Home / Self Care | Payer: Self-pay | Attending: Emergency Medicine | Admitting: Emergency Medicine

## 2016-12-03 DIAGNOSIS — F17290 Nicotine dependence, other tobacco product, uncomplicated: Secondary | ICD-10-CM | POA: Insufficient documentation

## 2016-12-03 DIAGNOSIS — Z79899 Other long term (current) drug therapy: Secondary | ICD-10-CM | POA: Insufficient documentation

## 2016-12-03 DIAGNOSIS — I10 Essential (primary) hypertension: Secondary | ICD-10-CM | POA: Insufficient documentation

## 2016-12-03 DIAGNOSIS — R112 Nausea with vomiting, unspecified: Secondary | ICD-10-CM

## 2016-12-03 DIAGNOSIS — E876 Hypokalemia: Secondary | ICD-10-CM | POA: Insufficient documentation

## 2016-12-03 DIAGNOSIS — R197 Diarrhea, unspecified: Secondary | ICD-10-CM | POA: Insufficient documentation

## 2016-12-03 LAB — COMPREHENSIVE METABOLIC PANEL
ALK PHOS: 51 U/L (ref 38–126)
ALT: 13 U/L — ABNORMAL LOW (ref 14–54)
ANION GAP: 11 (ref 5–15)
AST: 20 U/L (ref 15–41)
Albumin: 4.4 g/dL (ref 3.5–5.0)
BUN: 8 mg/dL (ref 6–20)
CALCIUM: 9 mg/dL (ref 8.9–10.3)
CHLORIDE: 105 mmol/L (ref 101–111)
CO2: 23 mmol/L (ref 22–32)
Creatinine, Ser: 0.76 mg/dL (ref 0.44–1.00)
GFR calc non Af Amer: >60 mL/min (ref >60)
Glucose, Bld: 92 mg/dL (ref 65–99)
Potassium: 3 mmol/L — ABNORMAL LOW (ref 3.5–5.1)
SODIUM: 139 mmol/L (ref 135–145)
Total Bilirubin: 0.7 mg/dL (ref 0.3–1.2)
Total Protein: 7.1 g/dL (ref 6.5–8.1)

## 2016-12-03 LAB — URINALYSIS, ROUTINE W REFLEX MICROSCOPIC
Bilirubin Urine: NEGATIVE
Glucose, UA: NEGATIVE mg/dL
HGB URINE DIPSTICK: NEGATIVE
Ketones, ur: NEGATIVE mg/dL
Leukocytes, UA: NEGATIVE
NITRITE: NEGATIVE
Protein, ur: NEGATIVE mg/dL
SPECIFIC GRAVITY, URINE: 1.015 (ref 1.005–1.030)
pH: 8.5 — ABNORMAL HIGH (ref 5.0–8.0)

## 2016-12-03 LAB — I-STAT BETA HCG BLOOD, ED (MC, WL, AP ONLY)

## 2016-12-03 LAB — CBC
HCT: 40.1 % (ref 36.0–46.0)
HEMOGLOBIN: 13.9 g/dL (ref 12.0–15.0)
MCH: 29.5 pg (ref 26.0–34.0)
MCHC: 34.7 g/dL (ref 30.0–36.0)
MCV: 85.1 fL (ref 78.0–100.0)
Platelets: 194 10*3/uL (ref 150–400)
RBC: 4.71 MIL/uL (ref 3.87–5.11)
RDW: 12.4 % (ref 11.5–15.5)
WBC: 8.7 10*3/uL (ref 4.0–10.5)

## 2016-12-03 LAB — MAGNESIUM: MAGNESIUM: 1.6 mg/dL — AB (ref 1.7–2.4)

## 2016-12-03 LAB — LIPASE, BLOOD: LIPASE: 20 U/L (ref 11–51)

## 2016-12-03 MED ORDER — POTASSIUM CHLORIDE CRYS ER 20 MEQ PO TBCR
40.0000 meq | EXTENDED_RELEASE_TABLET | Freq: Once | ORAL | Status: AC
  Administered 2016-12-03: 40 meq via ORAL
  Filled 2016-12-03: qty 2

## 2016-12-03 MED ORDER — MAGNESIUM CHLORIDE 64 MG PO TBEC
1.0000 | DELAYED_RELEASE_TABLET | Freq: Once | ORAL | Status: AC
  Administered 2016-12-03: 64 mg via ORAL
  Filled 2016-12-03: qty 1

## 2016-12-03 MED ORDER — LACTATED RINGERS IV BOLUS (SEPSIS)
1000.0000 mL | Freq: Once | INTRAVENOUS | Status: AC
  Administered 2016-12-03: 1000 mL via INTRAVENOUS

## 2016-12-03 MED ORDER — ONDANSETRON 4 MG PO TBDP: 4.0000 mg | ORAL_TABLET | Freq: Three times a day (TID) | ORAL | 0 refills | Status: DC | PRN

## 2016-12-03 NOTE — ED Triage Notes (Signed)
Per EMS pt from home had ETOH last night. C/o N/V since this am. 20G LAC 4mg  zofran. Also c/o full body aches.

## 2016-12-03 NOTE — ED Notes (Signed)
Patient given water to PO challenge. 

## 2016-12-03 NOTE — ED Provider Notes (Signed)
[facility]-EMERGENCY DEPT Provider Note   CSN: [id number] Arrival date & time: 12/03/16  1513     History   Chief Complaint Chief Complaint  Patient presents with  . Emesis  . Abdominal Pain    HPI Carrie Hampton is a 26 y.o. female.  The history is provided by the patient. No language interpreter was used.  Emesis   Associated symptoms include abdominal pain.  Abdominal Pain   Associated symptoms include vomiting.   Carrie Hampton is a 26 y.o. female who presents to the Emergency Department complaining of N/V/D.  Last night she was Drinking alcohol but not a  large amount. This morning she awoke with nausea, vomiting, diarrhea. She reports more than 10 episodes of emesis today. She has associated abdominal discomfort as well as locking up and tightness throughout her entire body. She has had similar episodes with this tightness all over her body multiple times in the past with no workup. Symptoms of the locking up sensation are intermittent and the episodes to terminate on their own but this is the worst episodes far. She is conscious throughout events but unable to move. Past Medical History:  Diagnosis Date  . Hypertension   . No pertinent past medical history   . STD (female) 2013   Chlamydia    There are no active problems to display for this patient.   Past Surgical History:  Procedure Laterality Date  . TOOTH EXTRACTION      OB History    Gravida Para Term Preterm AB Living   4       2 0   SAB TAB Ectopic Multiple Live Births   2               Home Medications    Prior to Admission medications   Medication Sig Start Date End Date Taking? Authorizing Provider  metroNIDAZOLE (FLAGYL) 500 MG tablet Take 1 tablet (500 mg total) by mouth 2 (two) times daily. 08/16/16   Harolyn Rutherford Rasch, NP  ondansetron (ZOFRAN ODT) 4 MG disintegrating tablet Take 1 tablet (4 mg total) by mouth every 8 (eight) hours as needed for nausea or vomiting. 12/03/16   Tilden Fossa, MD    ranitidine (ZANTAC) 150 MG tablet Take 1 tablet (150 mg total) by mouth 2 (two) times daily. 08/16/16   Duane Lope, NP    Family History Family History  Problem Relation Age of Onset  . Alcohol abuse Neg Hx   . Arthritis Neg Hx   . Asthma Neg Hx   . Birth defects Neg Hx   . Cancer Neg Hx   . COPD Neg Hx   . Depression Neg Hx   . Diabetes Neg Hx   . Drug abuse Neg Hx   . Early death Neg Hx   . Hearing loss Neg Hx   . Heart disease Neg Hx   . Hyperlipidemia Neg Hx   . Hypertension Neg Hx   . Kidney disease Neg Hx   . Learning disabilities Neg Hx   . Mental illness Neg Hx   . Mental retardation Neg Hx   . Miscarriages / Stillbirths Neg Hx   . Stroke Neg Hx   . Vision loss Neg Hx     Social History Social History  Substance Use Topics  . Smoking status: Current Every Day Smoker    Packs/day: 1.00    Types: Cigars    Last attempt to quit: 10/20/2012  . Smokeless tobacco: Never Used  .  Alcohol use Yes     Comment: socially     Allergies   Patient has no known allergies.   Review of Systems Review of Systems  Gastrointestinal: Positive for abdominal pain and vomiting.  All other systems reviewed and are negative.    Physical Exam Updated Vital Signs BP 105/75   Pulse 81   Temp 98 F (36.7 C) (Oral)   Resp 16   LMP 11/24/2016   SpO2 100%   Physical Exam  Constitutional: She is oriented to person, place, and time. She appears well-developed and well-nourished.  HENT:  Head: Normocephalic and atraumatic.  Cardiovascular: Normal rate and regular rhythm.   No murmur heard. Pulmonary/Chest: Effort normal and breath sounds normal. No respiratory distress.  Abdominal: Soft. There is no rebound and no guarding.  Mild diffuse abdominal tenderness  Musculoskeletal: She exhibits no edema or tenderness.  Neurological: She is alert and oriented to person, place, and time.  Skin: Skin is warm and dry.  Psychiatric: She has a normal mood and affect. Her  behavior is normal.  Nursing note and vitals reviewed.    ED Treatments / Results  Labs (all labs ordered are listed, but only abnormal results are displayed) Labs Reviewed  COMPREHENSIVE METABOLIC PANEL - Abnormal; Notable for the following:       Result Value   Potassium 3.0 (*)    ALT 13 (*)    All other components within normal limits  URINALYSIS, ROUTINE W REFLEX MICROSCOPIC - Abnormal; Notable for the following:    pH 8.5 (*)    All other components within normal limits  MAGNESIUM - Abnormal; Notable for the following:    Magnesium 1.6 (*)    All other components within normal limits  LIPASE, BLOOD  CBC  I-STAT BETA HCG BLOOD, ED ([facility], [facility], [facility] ONLY)    EKG  EKG Interpretation None       Radiology No results found.  Procedures Procedures (including critical care time)  Medications Ordered in ED Medications  lactated ringers bolus 1,000 mL (0 mLs Intravenous Stopped 12/03/16 1833)  potassium chloride SA (K-DUR,KLOR-CON) CR tablet 40 mEq (40 mEq Oral Given 12/03/16 1726)  magnesium chloride (SLOW-MAG) 64 MG SR tablet 64 mg (64 mg Oral Given 12/03/16 1845)     Initial Impression / Assessment and Plan / ED Course  I have reviewed the triage vital signs and the nursing notes.  Pertinent labs & imaging results that were available during my care of the patient were reviewed by me and considered in my medical decision making (see chart for details).  Clinical Course     Patient here for evaluation of nausea, vomiting, diarrhea as well as feeling locked up all over her body. Labs demonstrate mild hypokalemia and hypomagnesemia. These were replaced in the emergency department.  On repeat assessment on IV fluids and advance as she is feeling significantly improved with no recurrent vomiting. Discussed home care for vomiting diarrhea as follows hypokalemia and hypomagnesemia. Discussed outpatient follow-up and return precautions.  Current clinical picture is not consistent  with appendicitis, cholecystitis, UTI.  Final Clinical Impressions(s) / ED Diagnoses   Final diagnoses:  Nausea vomiting and diarrhea  Hypokalemia  Hypomagnesemia    New Prescriptions Discharge Medication List as of 12/03/2016  6:31 PM    START taking these medications   Details  ondansetron (ZOFRAN ODT) 4 MG disintegrating tablet Take 1 tablet (4 mg total) by mouth every 8 (eight) hours as needed for nausea or vomiting., Starting  Wynelle Link 12/03/2016, Print         Tilden Fossa, MD 12/04/16 [phone]

## 2016-12-03 NOTE — ED Triage Notes (Signed)
Pt drank alcohol last night. Woke up this morning with n/v and abdominal pain.

## 2016-12-03 NOTE — ED Notes (Signed)
Discharge instructions, follow up care, and rx x1 reviewed with patient. Patient verbalized understanding. 

## 2016-12-17 ENCOUNTER — Encounter (HOSPITAL_COMMUNITY): Payer: Self-pay | Admitting: Emergency Medicine

## 2016-12-17 ENCOUNTER — Emergency Department (HOSPITAL_COMMUNITY)
Admission: EM | Admit: 2016-12-17 | Discharge: 2016-12-17 | Disposition: A | Discharge status: Home / Self Care | Payer: Self-pay | Attending: Physician Assistant | Admitting: Physician Assistant

## 2016-12-17 DIAGNOSIS — I10 Essential (primary) hypertension: Secondary | ICD-10-CM | POA: Insufficient documentation

## 2016-12-17 DIAGNOSIS — B9689 Other specified bacterial agents as the cause of diseases classified elsewhere: Secondary | ICD-10-CM

## 2016-12-17 DIAGNOSIS — N390 Urinary tract infection, site not specified: Secondary | ICD-10-CM | POA: Insufficient documentation

## 2016-12-17 DIAGNOSIS — Z79899 Other long term (current) drug therapy: Secondary | ICD-10-CM | POA: Insufficient documentation

## 2016-12-17 DIAGNOSIS — N760 Acute vaginitis: Secondary | ICD-10-CM | POA: Insufficient documentation

## 2016-12-17 DIAGNOSIS — F17290 Nicotine dependence, other tobacco product, uncomplicated: Secondary | ICD-10-CM | POA: Insufficient documentation

## 2016-12-17 LAB — PREGNANCY, URINE: PREG TEST UR: NEGATIVE

## 2016-12-17 LAB — URINALYSIS, ROUTINE W REFLEX MICROSCOPIC
Bilirubin Urine: NEGATIVE
Glucose, UA: NEGATIVE mg/dL
Ketones, ur: 20 mg/dL — AB
Nitrite: NEGATIVE
PH: 6 (ref 5.0–8.0)
Protein, ur: 100 mg/dL — AB
SPECIFIC GRAVITY, URINE: 1.021 (ref 1.005–1.030)

## 2016-12-17 LAB — WET PREP, GENITAL
SPERM: NONE SEEN
TRICH WET PREP: NONE SEEN
WBC WET PREP: NONE SEEN
YEAST WET PREP: NONE SEEN

## 2016-12-17 LAB — I-STAT BETA HCG BLOOD, ED (MC, WL, AP ONLY): I-stat hCG, quantitative: <5 m[IU]/mL (ref <5)

## 2016-12-17 MED ORDER — CEPHALEXIN 250 MG PO CAPS
500.0000 mg | ORAL_CAPSULE | Freq: Once | ORAL | Status: AC
  Administered 2016-12-17: 500 mg via ORAL
  Filled 2016-12-17: qty 2

## 2016-12-17 MED ORDER — CEPHALEXIN 500 MG PO CAPS: 500.0000 mg | ORAL_CAPSULE | Freq: Four times a day (QID) | ORAL | 0 refills | Status: DC

## 2016-12-17 MED ORDER — METRONIDAZOLE 500 MG PO TABS
500.0000 mg | ORAL_TABLET | Freq: Once | ORAL | Status: AC
  Administered 2016-12-17: 500 mg via ORAL
  Filled 2016-12-17: qty 1

## 2016-12-17 MED ORDER — METRONIDAZOLE 500 MG PO TABS: 500.0000 mg | ORAL_TABLET | Freq: Two times a day (BID) | ORAL | 0 refills | Status: DC

## 2016-12-17 NOTE — Discharge Instructions (Signed)
You have a urinary tract infection and bacterial vaginosis. Please take the following antibitoics to help you.

## 2016-12-17 NOTE — ED Provider Notes (Addendum)
[facility]-EMERGENCY DEPT Provider Note   CSN: [id number] Arrival date & time: 12/17/16  1331  By signing my name below, I, Carrie Hampton, attest that this documentation has been prepared under the direction and in the presence of Carrie Perrow Randall An, MD. Electronically Signed: Alyssa Hampton, ED Scribe. 12/17/16. 4:19 PM.   History   Chief Complaint Chief Complaint  Patient presents with  . Vaginal Discharge   The history is provided by the patient. No language interpreter was used.   HPI Comments: Carrie Hampton is a 26 y.o. female who presents to the Emergency Department complaining of gradual onset, intermittent abnormal vaginal discharge beginning today. She began her menstrual cycle on 12/13/2016. She has been having a light period and was unable to fill a pad, which is atypical. This morning she noticed increased bleeding with abnormal vaginal discharge and abdominal tenderness. She has had recent unprotected sex. Pt does not use birth control. She denies malodorous discharge.  Past Medical History:  Diagnosis Date  . Hypertension   . No pertinent past medical history   . STD (female) 2013   Chlamydia    There are no active problems to display for this patient.   Past Surgical History:  Procedure Laterality Date  . TOOTH EXTRACTION      OB History    Gravida Para Term Preterm AB Living   4       2 0   SAB TAB Ectopic Multiple Live Births   2               Home Medications    Prior to Admission medications   Medication Sig Start Date End Date Taking? Authorizing Provider  metroNIDAZOLE (FLAGYL) 500 MG tablet Take 1 tablet (500 mg total) by mouth 2 (two) times daily. 08/16/16   Harolyn Rutherford Rasch, NP  ondansetron (ZOFRAN ODT) 4 MG disintegrating tablet Take 1 tablet (4 mg total) by mouth every 8 (eight) hours as needed for nausea or vomiting. 12/03/16   Tilden Fossa, MD  ranitidine (ZANTAC) 150 MG tablet Take 1 tablet (150 mg total) by mouth 2 (two) times daily. 08/16/16    Duane Lope, NP    Family History Family History  Problem Relation Age of Onset  . Alcohol abuse Neg Hx   . Arthritis Neg Hx   . Asthma Neg Hx   . Birth defects Neg Hx   . Cancer Neg Hx   . COPD Neg Hx   . Depression Neg Hx   . Diabetes Neg Hx   . Drug abuse Neg Hx   . Early death Neg Hx   . Hearing loss Neg Hx   . Heart disease Neg Hx   . Hyperlipidemia Neg Hx   . Hypertension Neg Hx   . Kidney disease Neg Hx   . Learning disabilities Neg Hx   . Mental illness Neg Hx   . Mental retardation Neg Hx   . Miscarriages / Stillbirths Neg Hx   . Stroke Neg Hx   . Vision loss Neg Hx     Social History Social History  Substance Use Topics  . Smoking status: Current Every Day Smoker    Packs/day: 1.00    Types: Cigars    Last attempt to quit: 10/20/2012  . Smokeless tobacco: Never Used  . Alcohol use Yes     Comment: socially     Allergies   Patient has no known allergies.   Review of Systems Review of Systems  Gastrointestinal:  Positive for abdominal pain.  Genitourinary: Positive for menstrual problem, vaginal bleeding and vaginal discharge.  All other systems reviewed and are negative.  Physical Exam Updated Vital Signs BP 127/82 (BP Location: Right Arm)   Pulse 98   Temp 99 F (37.2 C)   Resp 18   Ht 5\' 3"  (1.6 m)   Wt 110 lb (49.9 kg)   LMP 12/12/2016   SpO2 98%   BMI 19.49 kg/m   Physical Exam  Constitutional: She is oriented to person, place, and time. She appears well-developed and well-nourished.  HENT:  Head: Normocephalic and atraumatic.  Eyes: Right eye exhibits no discharge.  Cardiovascular: Normal rate, regular rhythm and normal heart sounds.   No murmur heard. Pulmonary/Chest: Effort normal and breath sounds normal. She has no wheezes. She has no rales.  Abdominal: Soft. She exhibits no distension. There is no tenderness.  Genitourinary: Vagina normal.  Genitourinary Comments: Small discharge  Neurological: She is oriented to  person, place, and time.  Skin: Skin is warm and dry. She is not diaphoretic.  Psychiatric: She has a normal mood and affect.  Nursing note and vitals reviewed.    ED Treatments / Results  DIAGNOSTIC STUDIES: Oxygen Saturation is 98% on RA, normal by my interpretation.    COORDINATION OF CARE: 4:05 PM Discussed treatment plan with pt at bedside which includes pelvic exam and urinalysis and pt agreed to plan.  Labs (all labs ordered are listed, but only abnormal results are displayed) Labs Reviewed - No data to display  EKG  EKG Interpretation None       Radiology No results found.  Procedures Procedures (including critical care time)  Medications Ordered in ED Medications - No data to display   Initial Impression / Assessment and Plan / ED Course  I have reviewed the triage vital signs and the nursing notes.  Pertinent labs & imaging results that were available during my care of the patient were reviewed by me and considered in my medical decision making (see chart for details).     I personally performed the services described in this documentation, which was scribed in my presence. The recorded information has been reviewed and is accurate.    Female here with lighter than usual meses and new dsicharge. + sexually active unprotected.   Will do vaginal exam, check pregnancy and STDS.  Vaginal exam showed discharge.   Clue cells.  Will treat for BV and UTI.  Patient is comfortable, ambulatory, and taking PO at time of discharge.  Patient expressed understanding about return precautions.      Final Clinical Impressions(s) / ED Diagnoses   Final diagnoses:  None    New Prescriptions New Prescriptions   No medications on file     Carrie Haddox Randall AnLyn Dannette Kinkaid, MD 12/17/16 2023    Carrie Mchan Randall AnLyn Yaxiel Minnie, MD 01/04/17 [id number]

## 2016-12-17 NOTE — ED Triage Notes (Signed)
Pt. Stated, I had my period started on Jan 16 but it was so light and then I have this weird discharge.

## 2016-12-18 LAB — GC/CHLAMYDIA PROBE AMP (~~LOC~~) NOT AT ARMC
CHLAMYDIA, DNA PROBE: NEGATIVE
NEISSERIA GONORRHEA: POSITIVE — AB

## 2017-02-15 ENCOUNTER — Emergency Department (HOSPITAL_COMMUNITY)
Admission: EM | Admit: 2017-02-15 | Discharge: 2017-02-15 | Disposition: A | Discharge status: Home / Self Care | Payer: Self-pay | Attending: Emergency Medicine | Admitting: Emergency Medicine

## 2017-02-15 ENCOUNTER — Encounter (HOSPITAL_COMMUNITY): Payer: Self-pay | Admitting: Nurse Practitioner

## 2017-02-15 DIAGNOSIS — G43A0 Cyclical vomiting, not intractable: Secondary | ICD-10-CM | POA: Insufficient documentation

## 2017-02-15 DIAGNOSIS — I10 Essential (primary) hypertension: Secondary | ICD-10-CM | POA: Insufficient documentation

## 2017-02-15 DIAGNOSIS — K5909 Other constipation: Secondary | ICD-10-CM

## 2017-02-15 DIAGNOSIS — R1115 Cyclical vomiting syndrome unrelated to migraine: Secondary | ICD-10-CM

## 2017-02-15 DIAGNOSIS — F17290 Nicotine dependence, other tobacco product, uncomplicated: Secondary | ICD-10-CM | POA: Insufficient documentation

## 2017-02-15 DIAGNOSIS — Z79899 Other long term (current) drug therapy: Secondary | ICD-10-CM | POA: Insufficient documentation

## 2017-02-15 LAB — CBC WITH DIFFERENTIAL/PLATELET
Basophils Absolute: 0 10*3/uL (ref 0.0–0.1)
Basophils Relative: 0 %
Eosinophils Absolute: 0.1 10*3/uL (ref 0.0–0.7)
Eosinophils Relative: 1 %
HEMATOCRIT: 40.4 % (ref 36.0–46.0)
HEMOGLOBIN: 14.4 g/dL (ref 12.0–15.0)
LYMPHS ABS: 1.2 10*3/uL (ref 0.7–4.0)
LYMPHS PCT: 19 %
MCH: 29.8 pg (ref 26.0–34.0)
MCHC: 35.6 g/dL (ref 30.0–36.0)
MCV: 83.6 fL (ref 78.0–100.0)
Monocytes Absolute: 0.2 10*3/uL (ref 0.1–1.0)
Monocytes Relative: 3 %
NEUTROS ABS: 4.9 10*3/uL (ref 1.7–7.7)
NEUTROS PCT: 77 %
Platelets: 163 10*3/uL (ref 150–400)
RBC: 4.83 MIL/uL (ref 3.87–5.11)
RDW: 12.2 % (ref 11.5–15.5)
WBC: 6.4 10*3/uL (ref 4.0–10.5)

## 2017-02-15 LAB — COMPREHENSIVE METABOLIC PANEL
ALK PHOS: 53 U/L (ref 38–126)
ALT: 13 U/L — ABNORMAL LOW (ref 14–54)
ANION GAP: 10 (ref 5–15)
AST: 19 U/L (ref 15–41)
Albumin: 4.4 g/dL (ref 3.5–5.0)
BILIRUBIN TOTAL: 0.6 mg/dL (ref 0.3–1.2)
BUN: 8 mg/dL (ref 6–20)
CALCIUM: 9.2 mg/dL (ref 8.9–10.3)
CO2: 24 mmol/L (ref 22–32)
Chloride: 110 mmol/L (ref 101–111)
Creatinine, Ser: 0.83 mg/dL (ref 0.44–1.00)
GLUCOSE: 90 mg/dL (ref 65–99)
Potassium: 3.4 mmol/L — ABNORMAL LOW (ref 3.5–5.1)
SODIUM: 144 mmol/L (ref 135–145)
TOTAL PROTEIN: 7.3 g/dL (ref 6.5–8.1)

## 2017-02-15 LAB — I-STAT BETA HCG BLOOD, ED (MC, WL, AP ONLY): I-stat hCG, quantitative: <5 m[IU]/mL (ref <5)

## 2017-02-15 LAB — LIPASE, BLOOD: LIPASE: 17 U/L (ref 11–51)

## 2017-02-15 MED ORDER — ONDANSETRON 4 MG PO TBDP: ORAL_TABLET | ORAL | 0 refills | Status: DC

## 2017-02-15 MED ORDER — DIPHENHYDRAMINE HCL 50 MG/ML IJ SOLN
25.0000 mg | Freq: Once | INTRAMUSCULAR | Status: AC
  Administered 2017-02-15: 25 mg via INTRAVENOUS
  Filled 2017-02-15: qty 1

## 2017-02-15 MED ORDER — PROCHLORPERAZINE EDISYLATE 5 MG/ML IJ SOLN
10.0000 mg | Freq: Once | INTRAMUSCULAR | Status: AC
  Administered 2017-02-15: 10 mg via INTRAVENOUS
  Filled 2017-02-15: qty 2

## 2017-02-15 MED ORDER — ONDANSETRON HCL 4 MG/2ML IJ SOLN
4.0000 mg | Freq: Once | INTRAMUSCULAR | Status: AC
  Administered 2017-02-15: 4 mg via INTRAVENOUS
  Filled 2017-02-15: qty 2

## 2017-02-15 MED ORDER — SODIUM CHLORIDE 0.9 % IV BOLUS (SEPSIS)
1000.0000 mL | Freq: Once | INTRAVENOUS | Status: AC
  Administered 2017-02-15: 1000 mL via INTRAVENOUS

## 2017-02-15 MED ORDER — GI COCKTAIL ~~LOC~~
30.0000 mL | Freq: Once | ORAL | Status: AC
  Administered 2017-02-15: 30 mL via ORAL
  Filled 2017-02-15: qty 30

## 2017-02-15 NOTE — ED Provider Notes (Signed)
[facility]-EMERGENCY DEPT Provider Note   CSN: [id number] Arrival date & time: 02/15/17  0900     History   Chief Complaint Chief Complaint  Patient presents with  . Nausea  . Emesis  . Abdominal Pain    HPI Carrie Hampton is a 26 y.o. female.  26 yo F with a chief complaint of diffuse abdominal pain. This started this morning. She has had a few episodes of vomiting with this. She's been constipated for the past 3 or 4 days. Has been tolerating by mouth the last couple days in fact she went drinking last night and had a few shots. She woke up this morning and she feels terrible. She she thinks that's the most important part is that she feels so bad. Denies focal abdominal tenderness.   The history is provided by the patient.  Emesis   This is a new problem. The current episode started 3 to 5 hours ago. The problem occurs 2 to 4 times per day. The problem has been resolved. The emesis has an appearance of stomach contents. There has been no fever. Associated symptoms include abdominal pain. Pertinent negatives include no arthralgias, no chills, no fever, no headaches and no myalgias.  Abdominal Pain   Associated symptoms include nausea, vomiting and constipation. Pertinent negatives include fever, dysuria, headaches, arthralgias and myalgias.    Past Medical History:  Diagnosis Date  . Hypertension   . No pertinent past medical history   . STD (female) 2013   Chlamydia    There are no active problems to display for this patient.   Past Surgical History:  Procedure Laterality Date  . TOOTH EXTRACTION      OB History    Gravida Para Term Preterm AB Living   4       2 0   SAB TAB Ectopic Multiple Live Births   2               Home Medications    Prior to Admission medications   Medication Sig Start Date End Date Taking? Authorizing Provider  ondansetron (ZOFRAN ODT) 4 MG disintegrating tablet 4mg  ODT q4 hours prn nausea/vomit 02/15/17   Melene Plan, DO  ranitidine  (ZANTAC) 150 MG tablet Take 1 tablet (150 mg total) by mouth 2 (two) times daily. Patient not taking: Reported on 02/15/2017 08/16/16   Duane Lope, NP    Family History Family History  Problem Relation Age of Onset  . Alcohol abuse Neg Hx   . Arthritis Neg Hx   . Asthma Neg Hx   . Birth defects Neg Hx   . Cancer Neg Hx   . COPD Neg Hx   . Depression Neg Hx   . Diabetes Neg Hx   . Drug abuse Neg Hx   . Early death Neg Hx   . Hearing loss Neg Hx   . Heart disease Neg Hx   . Hyperlipidemia Neg Hx   . Hypertension Neg Hx   . Kidney disease Neg Hx   . Learning disabilities Neg Hx   . Mental illness Neg Hx   . Mental retardation Neg Hx   . Miscarriages / Stillbirths Neg Hx   . Stroke Neg Hx   . Vision loss Neg Hx     Social History Social History  Substance Use Topics  . Smoking status: Current Every Day Smoker    Packs/day: 1.00    Types: Cigars    Last attempt to quit: 10/20/2012  .  Smokeless tobacco: Never Used  . Alcohol use Yes     Comment: socially     Allergies   Patient has no known allergies.   Review of Systems Review of Systems  Constitutional: Negative for chills and fever.  HENT: Negative for congestion and rhinorrhea.   Eyes: Negative for redness and visual disturbance.  Respiratory: Negative for shortness of breath and wheezing.   Cardiovascular: Negative for chest pain and palpitations.  Gastrointestinal: Positive for abdominal pain, constipation, nausea and vomiting.  Genitourinary: Negative for dysuria and urgency.  Musculoskeletal: Negative for arthralgias and myalgias.  Skin: Negative for pallor and wound.  Neurological: Negative for dizziness and headaches.     Physical Exam Updated Vital Signs BP 121/83   Pulse 88   Resp 12   Ht 5\' 3"  (1.6 m)   Wt 110 lb (49.9 kg)   SpO2 99%   BMI 19.49 kg/m   Physical Exam  Constitutional: She is oriented to person, place, and time. She appears well-developed and well-nourished. No  distress.  HENT:  Head: Normocephalic and atraumatic.  Eyes: EOM are normal. Pupils are equal, round, and reactive to light.  Neck: Normal range of motion. Neck supple.  Cardiovascular: Normal rate and regular rhythm.  Exam reveals no gallop and no friction rub.   No murmur heard. Pulmonary/Chest: Effort normal. She has no wheezes. She has no rales.  Abdominal: Soft. She exhibits no distension and no mass. There is tenderness (diffuse, mild). There is no guarding.  Musculoskeletal: She exhibits no edema or tenderness.  Neurological: She is alert and oriented to person, place, and time.  Skin: Skin is warm and dry. She is not diaphoretic.  Psychiatric: She has a normal mood and affect. Her behavior is normal.  Nursing note and vitals reviewed.    ED Treatments / Results  Labs (all labs ordered are listed, but only abnormal results are displayed) Labs Reviewed  COMPREHENSIVE METABOLIC PANEL - Abnormal; Notable for the following:       Result Value   Potassium 3.4 (*)    ALT 13 (*)    All other components within normal limits  CBC WITH DIFFERENTIAL/PLATELET  LIPASE, BLOOD  I-STAT BETA HCG BLOOD, ED ([facility], [facility], [facility] ONLY)    EKG  EKG Interpretation None       Radiology No results found.  Procedures Procedures (including critical care time)  Medications Ordered in ED Medications  gi cocktail (Maalox,Lidocaine,Donnatal) (30 mLs Oral Given 02/15/17 0948)  sodium chloride 0.9 % bolus 1,000 mL (0 mLs Intravenous Stopped 02/15/17 1121)  ondansetron (ZOFRAN) injection 4 mg (4 mg Intravenous Given 02/15/17 0948)  prochlorperazine (COMPAZINE) injection 10 mg (10 mg Intravenous Given 02/15/17 1137)  diphenhydrAMINE (BENADRYL) injection 25 mg (25 mg Intravenous Given 02/15/17 1137)     Initial Impression / Assessment and Plan / ED Course  I have reviewed the triage vital signs and the nursing notes.  Pertinent labs & imaging results that were available during my care of the patient  were reviewed by me and considered in my medical decision making (see chart for details).     26 yo F With a chief complaint of diffuse abdominal pain. This may be related to her taking last night. Patient was a bit histrionic on exam will obtain labs give fluids nausea medicine GI cocktail.   Labs unremarkable.  Patient having some continued vomiting post discharge, will give dose of compazine.   Tolerating PO, d/c home.   3:14 PM:  I  have discussed the diagnosis/risks/treatment options with the patient and family and believe the pt to be eligible for discharge home to follow-up with PCP. We also discussed returning to the ED immediately if new or worsening sx occur. We discussed the sx which are most concerning (e.g., sudden worsening pain, fever, inability to tolerate by mouth) that necessitate immediate return. Medications administered to the patient during their visit and any new prescriptions provided to the patient are listed below.  Medications given during this visit Medications  gi cocktail (Maalox,Lidocaine,Donnatal) (30 mLs Oral Given 02/15/17 0948)  sodium chloride 0.9 % bolus 1,000 mL (0 mLs Intravenous Stopped 02/15/17 1121)  ondansetron (ZOFRAN) injection 4 mg (4 mg Intravenous Given 02/15/17 0948)  prochlorperazine (COMPAZINE) injection 10 mg (10 mg Intravenous Given 02/15/17 1137)  diphenhydrAMINE (BENADRYL) injection 25 mg (25 mg Intravenous Given 02/15/17 1137)     The patient appears reasonably screen and/or stabilized for discharge and I doubt any other medical condition or other [facility] requiring further screening, evaluation, or treatment in the ED at this time prior to discharge.   Final Clinical Impressions(s) / ED Diagnoses   Final diagnoses:  Non-intractable cyclical vomiting with nausea  Other constipation    New Prescriptions Discharge Medication List as of 02/15/2017 10:24 AM       Melene Planan Reinhold Rickey, DO 02/15/17 1514

## 2017-02-15 NOTE — ED Notes (Signed)
Bed: WA24 Expected date:  Expected time:  Means of arrival:  Comments: EMS-N/V 

## 2017-02-15 NOTE — Discharge Instructions (Signed)
Follow up with your PCP.  Return for inability to eat or drink, persistent fever >5 days, sudden worsening abdominal pain. ° °

## 2017-02-15 NOTE — ED Triage Notes (Signed)
Patient had a few acholic drinks last night and woke up this morning with nausea vomiting and abdominal pain.

## 2017-03-02 ENCOUNTER — Encounter (HOSPITAL_COMMUNITY): Payer: Self-pay

## 2017-03-02 ENCOUNTER — Inpatient Hospital Stay (HOSPITAL_COMMUNITY)
Admission: AD | Admit: 2017-03-02 | Discharge: 2017-03-02 | Disposition: A | Discharge status: Home / Self Care | Payer: Self-pay | Source: Ambulatory Visit | Attending: Obstetrics & Gynecology | Admitting: Obstetrics & Gynecology

## 2017-03-02 DIAGNOSIS — F17200 Nicotine dependence, unspecified, uncomplicated: Secondary | ICD-10-CM | POA: Insufficient documentation

## 2017-03-02 DIAGNOSIS — K920 Hematemesis: Secondary | ICD-10-CM | POA: Insufficient documentation

## 2017-03-02 DIAGNOSIS — R1013 Epigastric pain: Secondary | ICD-10-CM | POA: Insufficient documentation

## 2017-03-02 LAB — CBC
HCT: 39.8 % (ref 36.0–46.0)
Hemoglobin: 13.7 g/dL (ref 12.0–15.0)
MCH: 30.2 pg (ref 26.0–34.0)
MCHC: 34.4 g/dL (ref 30.0–36.0)
MCV: 87.9 fL (ref 78.0–100.0)
PLATELETS: 208 10*3/uL (ref 150–400)
RBC: 4.53 MIL/uL (ref 3.87–5.11)
RDW: 12.9 % (ref 11.5–15.5)
WBC: 7.8 10*3/uL (ref 4.0–10.5)

## 2017-03-02 LAB — COMPREHENSIVE METABOLIC PANEL
ALT: 14 U/L (ref 14–54)
ANION GAP: 9 (ref 5–15)
AST: 23 U/L (ref 15–41)
Albumin: 4.3 g/dL (ref 3.5–5.0)
Alkaline Phosphatase: 48 U/L (ref 38–126)
BILIRUBIN TOTAL: 1 mg/dL (ref 0.3–1.2)
BUN: 8 mg/dL (ref 6–20)
CO2: 25 mmol/L (ref 22–32)
Calcium: 8.9 mg/dL (ref 8.9–10.3)
Chloride: 106 mmol/L (ref 101–111)
Creatinine, Ser: 0.69 mg/dL (ref 0.44–1.00)
GFR calc Af Amer: >60 mL/min (ref >60)
GFR calc non Af Amer: >60 mL/min (ref >60)
Glucose, Bld: 79 mg/dL (ref 65–99)
POTASSIUM: 3.2 mmol/L — AB (ref 3.5–5.1)
Sodium: 140 mmol/L (ref 135–145)
TOTAL PROTEIN: 7.2 g/dL (ref 6.5–8.1)

## 2017-03-02 LAB — POCT PREGNANCY, URINE: PREG TEST UR: NEGATIVE

## 2017-03-02 MED ORDER — SUCRALFATE 1 GM/10ML PO SUSP
1.0000 g | Freq: Three times a day (TID) | ORAL | Status: DC
  Administered 2017-03-02: 1 g via ORAL
  Filled 2017-03-02 (×4): qty 10

## 2017-03-02 NOTE — Discharge Instructions (Signed)
Peptic Ulcer °A peptic ulcer is a painful sore in the lining of your esophagus, stomach, or the first part of your small intestine. You may have pain in the area between your chest and your belly button. The most common causes of an ulcer are: °· An infection. °· Using certain pain medicines too often or too much. °Follow these instructions at home: °· Avoid alcohol. °· Avoid caffeine. °· Do not use any tobacco products. These include cigarettes, chewing tobacco, and e-cigarettes. If you need help quitting, ask your doctor. °· Take over-the-counter and prescription medicines only as told by your doctor. Do not stop or change your medicines unless you talk with your doctor about it first. °· Keep all follow-up visits as told by your doctor. This is important. °Contact a doctor if: °· You do not get better in 7 days after you start treatment. °· You keep having an upset stomach (indigestion) or heartburn. °Get help right away if: °· You have sudden, sharp pain in your belly (abdomen). °· You have lasting belly pain. °· You have bloody poop (stool) or black, tarry poop. °· You throw up (vomit) blood. It may look like coffee grounds. °· You feel light-headed or feel like you may pass out (faint). °· You get weak. °· You get sweaty or feel sticky and cold to the touch (clammy). °This information is not intended to replace advice given to you by your health care provider. Make sure you discuss any questions you have with your health care provider. °Document Released: 02/07/2010 Document Revised: 03/29/2016 Document Reviewed: 08/14/2015 °Elsevier Interactive Patient Education © 2017 [employer] ° °

## 2017-03-02 NOTE — MAU Provider Note (Signed)
Patient Carrie Hampton is a 26 year old non-pregnant female here with complaints of abdominal pain and throwing up blood since last night. She says she has not eaten anything today but yesterday she did eat [employer], sausage, eggs and pancakes.   Patient has been seen in the [facility] ED for nausea and vomiting in the past.  History     CSN: [id number]  Arrival date and time: 03/02/17 1508   First Provider Initiated Contact with Patient 03/02/17 1529      Chief Complaint  Patient presents with  . Abdominal Cramping  . Hematemesis   Abdominal Cramping  This is a new problem. The current episode started today. The onset quality is sudden. The problem occurs intermittently. The problem has been unchanged. The pain is located in the epigastric region. The pain is at a severity of 8/10. The quality of the pain is burning, aching and cramping. The abdominal pain does not radiate. Associated symptoms include vomiting. Pertinent negatives include no constipation, diarrhea, dysuria, fever or hematuria. Nothing aggravates the pain. The pain is relieved by nothing. She has tried nothing for the symptoms.  Emesis   This is a new problem. The current episode started yesterday. The problem occurs less than 2 times per day. The problem has been unchanged. There has been no fever. Associated symptoms include abdominal pain. Pertinent negatives include no chest pain, diarrhea or fever. She has tried nothing for the symptoms.    OB History    Gravida Para Term Preterm AB Living   4       2 0   SAB TAB Ectopic Multiple Live Births   2              Past Medical History:  Diagnosis Date  . Hypertension   . No pertinent past medical history   . STD (female) 2013   Chlamydia    Past Surgical History:  Procedure Laterality Date  . TOOTH EXTRACTION      Family History  Problem Relation Age of Onset  . Alcohol abuse Neg Hx   . Arthritis Neg Hx   . Asthma Neg Hx   . Birth defects Neg Hx   .  Cancer Neg Hx   . COPD Neg Hx   . Depression Neg Hx   . Diabetes Neg Hx   . Drug abuse Neg Hx   . Early death Neg Hx   . Hearing loss Neg Hx   . Heart disease Neg Hx   . Hyperlipidemia Neg Hx   . Hypertension Neg Hx   . Kidney disease Neg Hx   . Learning disabilities Neg Hx   . Mental illness Neg Hx   . Mental retardation Neg Hx   . Miscarriages / Stillbirths Neg Hx   . Stroke Neg Hx   . Vision loss Neg Hx     Social History  Substance Use Topics  . Smoking status: Current Every Day Smoker    Packs/day: 1.00    Types: Cigars    Last attempt to quit: 10/20/2012  . Smokeless tobacco: Never Used  . Alcohol use Yes     Comment: socially    Allergies: No Known Allergies  Prescriptions Prior to Admission  Medication Sig Dispense Refill Last Dose  . ibuprofen (ADVIL,MOTRIN) 200 MG tablet Take 400 mg by mouth every 6 (six) hours as needed for headache or mild pain.   03/01/2017 at Unknown time  . ondansetron (ZOFRAN ODT) 4 MG disintegrating tablet   ODT q4 hours prn nausea/vomit (Patient not taking: Reported on 03/02/2017) 20 tablet 0 Not Taking at Unknown time    Review of Systems  Constitutional: Negative for fever.  Cardiovascular: Negative for chest pain.  Gastrointestinal: Positive for abdominal pain and vomiting. Negative for constipation and diarrhea.  Genitourinary: Negative for dysuria and hematuria.  Neurological: Negative.   Hematological: Negative.   Psychiatric/Behavioral: Negative.    Physical Exam   Blood pressure 119/79, pulse 72, temperature 98.7 F (37.1 C), resp. rate 16, height  (1.6 m), weight 48.5 kg (107 lb), unknown if currently breastfeeding.  Physical Exam  Constitutional: She is oriented to person, place, and time. She appears well-developed.  HENT:  Head: Normocephalic.  Neck: Normal range of motion.  Respiratory: Effort normal.  GI: Soft. She exhibits no distension and no mass. There is tenderness. There is no rebound and no guarding.   Slight epigastric tenderness  Musculoskeletal: Normal range of motion.  Neurological: She is alert and oriented to person, place, and time. She has normal reflexes.  Skin: Skin is warm and dry.  Psychiatric: She has a normal mood and affect.    MAU Course  Procedures  MDM -No vomiting while in MAU; had a carafate solution but wanted to leave before she could tell us if it helped -CBC and CMP normal -afebrile; vital signs stable  Assessment and Plan   1. Epigastric pain    2. Patient stable for discharge after emergent conditions ruled out; recommended that patient make an appointment with a PCP or GI doctor for GI work-up 3. Recommended that patient return to [facility] ED if emesis worsens; encouraged low-fat, low grease diet and to begin. Informed patient that, based on her diet and symptoms, she may be developing an ulcer. Recommended that she change her diet and begin taking OTC PPI or H2 blocker.   Charlesetta Garibaldi Joeangel Jeanpaul CNM 03/02/2017, 4:28 PM

## 2017-03-02 NOTE — MAU Note (Signed)
Vomited blood last night and this morning, having severe abdominal cramping, denies diarrhea, unsure of last period, has history of irregular periods, body feels weak, denies vaginal discharge.

## 2017-03-02 NOTE — MAU Note (Signed)
Urine in lab 

## 2017-04-26 ENCOUNTER — Emergency Department (HOSPITAL_COMMUNITY)
Admission: EM | Admit: 2017-04-26 | Discharge: 2017-04-26 | Disposition: A | Discharge status: Home / Self Care | Payer: Self-pay | Attending: Emergency Medicine | Admitting: Emergency Medicine

## 2017-04-26 ENCOUNTER — Encounter (HOSPITAL_COMMUNITY): Payer: Self-pay | Admitting: Nurse Practitioner

## 2017-04-26 DIAGNOSIS — F17290 Nicotine dependence, other tobacco product, uncomplicated: Secondary | ICD-10-CM | POA: Insufficient documentation

## 2017-04-26 DIAGNOSIS — I10 Essential (primary) hypertension: Secondary | ICD-10-CM | POA: Insufficient documentation

## 2017-04-26 DIAGNOSIS — R197 Diarrhea, unspecified: Secondary | ICD-10-CM | POA: Insufficient documentation

## 2017-04-26 DIAGNOSIS — R1115 Cyclical vomiting syndrome unrelated to migraine: Secondary | ICD-10-CM

## 2017-04-26 DIAGNOSIS — G43A0 Cyclical vomiting, not intractable: Secondary | ICD-10-CM | POA: Insufficient documentation

## 2017-04-26 LAB — COMPREHENSIVE METABOLIC PANEL
ALBUMIN: 4.3 g/dL (ref 3.5–5.0)
ALK PHOS: 56 U/L (ref 38–126)
ALT: 16 U/L (ref 14–54)
ANION GAP: 9 (ref 5–15)
AST: 24 U/L (ref 15–41)
BUN: 9 mg/dL (ref 6–20)
CALCIUM: 9.2 mg/dL (ref 8.9–10.3)
CO2: 25 mmol/L (ref 22–32)
Chloride: 109 mmol/L (ref 101–111)
Creatinine, Ser: 0.94 mg/dL (ref 0.44–1.00)
GFR calc Af Amer: >60 mL/min (ref >60)
GFR calc non Af Amer: >60 mL/min (ref >60)
GLUCOSE: 102 mg/dL — AB (ref 65–99)
POTASSIUM: 3.7 mmol/L (ref 3.5–5.1)
SODIUM: 143 mmol/L (ref 135–145)
Total Bilirubin: 0.6 mg/dL (ref 0.3–1.2)
Total Protein: 7.3 g/dL (ref 6.5–8.1)

## 2017-04-26 LAB — CBC WITH DIFFERENTIAL/PLATELET
BASOS PCT: 0 %
Basophils Absolute: 0 10*3/uL (ref 0.0–0.1)
EOS ABS: 0 10*3/uL (ref 0.0–0.7)
Eosinophils Relative: 0 %
HCT: 43.5 % (ref 36.0–46.0)
Hemoglobin: 15.2 g/dL — ABNORMAL HIGH (ref 12.0–15.0)
LYMPHS ABS: 0.9 10*3/uL (ref 0.7–4.0)
Lymphocytes Relative: 9 %
MCH: 30.2 pg (ref 26.0–34.0)
MCHC: 34.9 g/dL (ref 30.0–36.0)
MCV: 86.3 fL (ref 78.0–100.0)
MONOS PCT: 3 %
Monocytes Absolute: 0.3 10*3/uL (ref 0.1–1.0)
NEUTROS PCT: 88 %
Neutro Abs: 8.4 10*3/uL — ABNORMAL HIGH (ref 1.7–7.7)
PLATELETS: 156 10*3/uL (ref 150–400)
RBC: 5.04 MIL/uL (ref 3.87–5.11)
RDW: 12.3 % (ref 11.5–15.5)
WBC: 9.6 10*3/uL (ref 4.0–10.5)

## 2017-04-26 LAB — I-STAT BETA HCG BLOOD, ED (MC, WL, AP ONLY)

## 2017-04-26 LAB — MAGNESIUM: Magnesium: 1.6 mg/dL — ABNORMAL LOW (ref 1.7–2.4)

## 2017-04-26 MED ORDER — HALOPERIDOL LACTATE 5 MG/ML IJ SOLN: 2.0000 mg | Freq: Once | INTRAMUSCULAR | Status: DC

## 2017-04-26 MED ORDER — MAGNESIUM CHLORIDE 64 MG PO TBEC
1.0000 | DELAYED_RELEASE_TABLET | Freq: Once | ORAL | Status: AC
  Administered 2017-04-26: 64 mg via ORAL
  Filled 2017-04-26: qty 1

## 2017-04-26 NOTE — ED Notes (Signed)
Bed: [id number] Expected date:  Expected time:  Means of arrival:  Comments: EMS- n/v, spasms

## 2017-04-26 NOTE — ED Triage Notes (Signed)
Patient called EMS for 1 day of N/V/D and muscle spasms. Patient stated it had been going on for about 2 hours. Patient also stated she has been having un-protected sex without BC. Last period was earlier this weeks.

## 2017-04-26 NOTE — ED Provider Notes (Signed)
[facility]-EMERGENCY DEPT Provider Note   CSN: [id number] Arrival date & time: 04/26/17  0820     History   Chief Complaint Chief Complaint  Patient presents with  . Nausea  . Emesis  . Diarrhea    HPI Carrie Hampton is a 26 y.o. female.  The history is provided by the patient.  Emesis   This is a recurrent problem. The current episode started 3 to 5 hours ago. The problem occurs 5 to 10 times per day. The problem has not changed since onset.The emesis has an appearance of stomach contents. There has been no fever. Associated symptoms include abdominal pain, chills and diarrhea. Pertinent negatives include no cough, no fever and no URI.  Diarrhea   This is a recurrent problem. The current episode started 3 to 5 hours ago. The problem occurs 2 to 4 times per day. The stool consistency is described as watery. Associated symptoms include abdominal pain, vomiting and chills. Pertinent negatives include no URI and no cough. She has tried nothing for the symptoms.   Similar to prior presentations. No suspicious food intake, recent travel, sick contacts.   Past Medical History:  Diagnosis Date  . Hypertension   . No pertinent past medical history   . STD (female) 2013   Chlamydia    There are no active problems to display for this patient.   Past Surgical History:  Procedure Laterality Date  . TOOTH EXTRACTION      OB History    Gravida Para Term Preterm AB Living   4       2 0   SAB TAB Ectopic Multiple Live Births   2               Home Medications    Prior to Admission medications   Not on File    Family History Family History  Problem Relation Age of Onset  . Alcohol abuse Neg Hx   . Arthritis Neg Hx   . Asthma Neg Hx   . Birth defects Neg Hx   . Cancer Neg Hx   . COPD Neg Hx   . Depression Neg Hx   . Diabetes Neg Hx   . Drug abuse Neg Hx   . Early death Neg Hx   . Hearing loss Neg Hx   . Heart disease Neg Hx   . Hyperlipidemia Neg Hx   .  Hypertension Neg Hx   . Kidney disease Neg Hx   . Learning disabilities Neg Hx   . Mental illness Neg Hx   . Mental retardation Neg Hx   . Miscarriages / Stillbirths Neg Hx   . Stroke Neg Hx   . Vision loss Neg Hx     Social History Social History  Substance Use Topics  . Smoking status: Current Every Day Smoker    Packs/day: 1.00    Types: Cigars    Last attempt to quit: 10/20/2012  . Smokeless tobacco: Never Used  . Alcohol use Yes     Comment: socially     Allergies   Patient has no known allergies.   Review of Systems Review of Systems  Constitutional: Positive for chills. Negative for fever.  Respiratory: Negative for cough.   Gastrointestinal: Positive for abdominal pain, diarrhea and vomiting.  All other systems are reviewed and are negative for acute change except as noted in the HPI    Physical Exam Updated Vital Signs BP 100/80   Pulse 84   Temp 97.4  F (36.3 C) (Oral)   Resp 18   Ht 5\' 3"  (1.6 m)   Wt 48.1 kg (106 lb)   LMP 04/16/2017 (Exact Date)   SpO2 99%   BMI 18.78 kg/m   Physical Exam  Constitutional: She is oriented to person, place, and time. She appears well-developed and well-nourished. No distress.  HENT:  Head: Normocephalic and atraumatic.  Nose: Nose normal.  Eyes: Conjunctivae and EOM are normal. Pupils are equal, round, and reactive to light. Right eye exhibits no discharge. Left eye exhibits no discharge. No scleral icterus.  Neck: Normal range of motion. Neck supple.  Cardiovascular: Normal rate and regular rhythm.  Exam reveals no gallop and no friction rub.   No murmur heard. Pulmonary/Chest: Effort normal and breath sounds normal. No stridor. No respiratory distress. She has no rales.  Abdominal: Soft. She exhibits no distension. There is no tenderness.  Musculoskeletal: She exhibits no edema or tenderness.  Neurological: She is alert and oriented to person, place, and time.  Skin: Skin is warm and dry. No rash noted. She  is not diaphoretic. No erythema.  Psychiatric: She has a normal mood and affect.  Vitals reviewed.    ED Treatments / Results  Labs (all labs ordered are listed, but only abnormal results are displayed) Labs Reviewed  CBC WITH DIFFERENTIAL/PLATELET - Abnormal; Notable for the following:       Result Value   Hemoglobin 15.2 (*)    Neutro Abs 8.4 (*)    All other components within normal limits  COMPREHENSIVE METABOLIC PANEL - Abnormal; Notable for the following:    Glucose, Bld 102 (*)    All other components within normal limits  MAGNESIUM - Abnormal; Notable for the following:    Magnesium 1.6 (*)    All other components within normal limits  I-STAT BETA HCG BLOOD, ED ([facility], [facility], [facility] ONLY)    EKG  EKG Interpretation  Date/Time:  Thursday Apr 26 2017 08:51:49 EDT Ventricular Rate:  74 PR Interval:    QRS Duration: 92 QT Interval:  374 QTC Calculation: 415 R Axis:   92 Text Interpretation:  Sinus rhythm Atrial premature complex Borderline right axis deviation No significant change since last tracing Confirmed by [facility] LtdCARDAMA MD, Kawthar Ennen ([id number]) on 04/26/2017 9:19:30 AM       Radiology No results found.  Procedures Procedures (including critical care time)  Medications Ordered in ED Medications  haloperidol lactate (HALDOL) injection 2 mg (not administered)  magnesium chloride (SLOW-MAG) 64 MG SR tablet 64 mg (64 mg Oral Given 04/26/17 1100)     Initial Impression / Assessment and Plan / ED Course  I have reviewed the triage vital signs and the nursing notes.  Pertinent labs & imaging results that were available during my care of the patient were reviewed by me and considered in my medical decision making (see chart for details).     Exacerbation of patient's recurrent cyclical vomiting. Labs grossly reassuring except mild hypomagnesemia. EKG without QT prolongation. Patient given 2 mg of Haldol which had significant improvement in her symptomatology. Able to tolerate by  mouth magnesium and hydration. Abdomen benign. Low suspicion for serious intra-abdominal inflammatory/infectious process. That she is appropriate for discharge with strict return precautions.  Final Clinical Impressions(s) / ED Diagnoses   Final diagnoses:  Non-intractable cyclical vomiting with nausea   Disposition: Discharge  Condition: Good  I have discussed the results, Dx and Tx plan with the patient who expressed understanding and agree(s) with the plan. Discharge  instructions discussed at great length. The patient was given strict return precautions who verbalized understanding of the instructions. No further questions at time of discharge.    New Prescriptions   No medications on file    Follow Up: Patient, No Pcp Per  Schedule an appointment as soon as possible for a visit        Charlee Whitebread, Amadeo Garnet, MD 04/26/17 1119

## 2017-08-02 ENCOUNTER — Encounter (HOSPITAL_COMMUNITY): Payer: Self-pay

## 2017-08-02 ENCOUNTER — Inpatient Hospital Stay (HOSPITAL_COMMUNITY)
Admission: AD | Admit: 2017-08-02 | Discharge: 2017-08-02 | Disposition: A | Discharge status: Home / Self Care | Payer: Medicaid Other | Source: Ambulatory Visit | Attending: Obstetrics and Gynecology | Admitting: Obstetrics and Gynecology

## 2017-08-02 ENCOUNTER — Inpatient Hospital Stay (HOSPITAL_COMMUNITY): Payer: Medicaid Other

## 2017-08-02 DIAGNOSIS — R102 Pelvic and perineal pain: Secondary | ICD-10-CM

## 2017-08-02 DIAGNOSIS — B9689 Other specified bacterial agents as the cause of diseases classified elsewhere: Secondary | ICD-10-CM | POA: Insufficient documentation

## 2017-08-02 DIAGNOSIS — O23591 Infection of other part of genital tract in pregnancy, first trimester: Secondary | ICD-10-CM | POA: Insufficient documentation

## 2017-08-02 DIAGNOSIS — Z3A01 Less than 8 weeks gestation of pregnancy: Secondary | ICD-10-CM | POA: Diagnosis not present

## 2017-08-02 DIAGNOSIS — Z87891 Personal history of nicotine dependence: Secondary | ICD-10-CM | POA: Diagnosis not present

## 2017-08-02 DIAGNOSIS — O26899 Other specified pregnancy related conditions, unspecified trimester: Secondary | ICD-10-CM

## 2017-08-02 DIAGNOSIS — O26891 Other specified pregnancy related conditions, first trimester: Secondary | ICD-10-CM

## 2017-08-02 DIAGNOSIS — R109 Unspecified abdominal pain: Secondary | ICD-10-CM | POA: Diagnosis present

## 2017-08-02 DIAGNOSIS — N760 Acute vaginitis: Secondary | ICD-10-CM

## 2017-08-02 LAB — COMPREHENSIVE METABOLIC PANEL
ALK PHOS: 54 U/L (ref 38–126)
ALT: 10 U/L — ABNORMAL LOW (ref 14–54)
ANION GAP: 9 (ref 5–15)
AST: 19 U/L (ref 15–41)
Albumin: 4.1 g/dL (ref 3.5–5.0)
BUN: 6 mg/dL (ref 6–20)
CALCIUM: 9.1 mg/dL (ref 8.9–10.3)
CHLORIDE: 104 mmol/L (ref 101–111)
CO2: 23 mmol/L (ref 22–32)
Creatinine, Ser: 0.66 mg/dL (ref 0.44–1.00)
Glucose, Bld: 74 mg/dL (ref 65–99)
Potassium: 3.2 mmol/L — ABNORMAL LOW (ref 3.5–5.1)
SODIUM: 136 mmol/L (ref 135–145)
Total Bilirubin: 0.9 mg/dL (ref 0.3–1.2)
Total Protein: 6.7 g/dL (ref 6.5–8.1)

## 2017-08-02 LAB — WET PREP, GENITAL
Sperm: NONE SEEN
TRICH WET PREP: NONE SEEN
Yeast Wet Prep HPF POC: NONE SEEN

## 2017-08-02 LAB — URINALYSIS, ROUTINE W REFLEX MICROSCOPIC
BACTERIA UA: NONE SEEN
Bilirubin Urine: NEGATIVE
Glucose, UA: NEGATIVE mg/dL
Hgb urine dipstick: NEGATIVE
KETONES UR: NEGATIVE mg/dL
Nitrite: NEGATIVE
PROTEIN: NEGATIVE mg/dL
Specific Gravity, Urine: 1.021 (ref 1.005–1.030)
pH: 7 (ref 5.0–8.0)

## 2017-08-02 LAB — CBC
HCT: 39.3 % (ref 36.0–46.0)
Hemoglobin: 13.9 g/dL (ref 12.0–15.0)
MCH: 29.8 pg (ref 26.0–34.0)
MCHC: 35.4 g/dL (ref 30.0–36.0)
MCV: 84.3 fL (ref 78.0–100.0)
PLATELETS: 200 10*3/uL (ref 150–400)
RBC: 4.66 MIL/uL (ref 3.87–5.11)
RDW: 12.1 % (ref 11.5–15.5)
WBC: 5.2 10*3/uL (ref 4.0–10.5)

## 2017-08-02 LAB — HCG, QUANTITATIVE, PREGNANCY: HCG, BETA CHAIN, QUANT, S: 45758 m[IU]/mL — AB (ref <5)

## 2017-08-02 LAB — POCT PREGNANCY, URINE: Preg Test, Ur: POSITIVE — AB

## 2017-08-02 LAB — ABO/RH: ABO/RH(D): O POS

## 2017-08-02 MED ORDER — METRONIDAZOLE 500 MG PO TABS: 500.0000 mg | ORAL_TABLET | Freq: Three times a day (TID) | ORAL | 0 refills | Status: AC

## 2017-08-02 NOTE — Discharge Instructions (Signed)
Eating Plan for Pregnant Women While you are pregnant, your body will require additional nutrition to help support your growing baby. It is recommended that you consume:  150 additional calories each day during your first trimester.  300 additional calories each day during your second trimester.  300 additional calories each day during your third trimester.  Eating a healthy, well-balanced diet is very important for your health and for your baby's health. You also have a higher need for some vitamins and minerals, such as folic acid, calcium, iron, and vitamin D. What do I need to know about eating during pregnancy?  Do not try to lose weight or go on a diet during pregnancy.  Choose healthy, nutritious foods. Choose  of a sandwich with a glass of milk instead of a candy bar or a high-calorie sugar-sweetened beverage.  Limit your overall intake of foods that have "empty calories." These are foods that have little nutritional value, such as sweets, desserts, candies, sugar-sweetened beverages, and fried foods.  Eat a variety of foods, especially fruits and vegetables.  Take a prenatal vitamin to help meet the additional needs during pregnancy, specifically for folic acid, iron, calcium, and vitamin D.  Remember to stay active. Ask your health care provider for exercise recommendations that are specific to you.  Practice good food safety and cleanliness, such as washing your hands before you eat and after you prepare raw meat. This helps to prevent foodborne illnesses, such as listeriosis, that can be very dangerous for your baby. Ask your health care provider for more information about listeriosis. What does 150 extra calories look like? Healthy options for an additional 150 calories each day could be any of the following:  Plain low-fat yogurt (6-8 oz) with  cup of berries.  1 apple with 2 teaspoons of peanut butter.  Cut-up vegetables with  cup of hummus.  Low-fat chocolate  milk (8 oz or 1 cup).  1 string cheese with 1 medium orange.   of a peanut butter and jelly sandwich on whole-wheat bread (1 tsp of peanut butter).  For 300 calories, you could eat two of those healthy options each day. What is a healthy amount of weight to gain? The recommended amount of weight for you to gain is based on your pre-pregnancy BMI. If your pre-pregnancy BMI was:  Less than 18 (underweight), you should gain 28-40 lb.  18-24.9 (normal), you should gain 25-35 lb.  25-29.9 (overweight), you should gain 15-25 lb.  Greater than 30 (obese), you should gain 11-20 lb.  What if I am having twins or multiples? Generally, pregnant women who will be having twins or multiples may need to increase their daily calories by 300-600 calories each day. The recommended range for total weight gain is 25-54 lb, depending on your pre-pregnancy BMI. Talk with your health care provider for specific guidance about additional nutritional needs, weight gain, and exercise during your pregnancy. What foods can I eat? Grains Any grains. Try to choose whole grains, such as whole-wheat bread, oatmeal, or brown rice. Vegetables Any vegetables. Try to eat a variety of colors and types of vegetables to get a full range of vitamins and minerals. Remember to wash your vegetables well before eating. Fruits Any fruits. Try to eat a variety of colors and types of fruit to get a full range of vitamins and minerals. Remember to wash your fruits well before eating. Meats and Other Protein Sources Lean meats, including chicken, turkey, fish, and lean cuts of beef, veal,   or pork. Make sure that all meats are cooked to "well done." Tofu. Tempeh. Beans. Eggs. Peanut butter and other nut butters. Seafood, such as shrimp, crab, and lobster. If you choose fish, select types that are higher in omega-3 fatty acids, including salmon, herring, mussels, trout, sardines, and pollock. Make sure that all meats are cooked to  food-safe temperatures. Dairy Pasteurized milk and milk alternatives. Pasteurized yogurt and pasteurized cheese. Cottage cheese. Sour cream. Beverages Water. Juices that contain 100% fruit juice or vegetable juice. Caffeine-free teas and decaffeinated coffee. Drinks that contain caffeine are okay to drink, but it is better to avoid caffeine. Keep your total caffeine intake to less than 200 mg each day (12 oz of coffee, tea, or soda) or as directed by your health care provider. Condiments Any pasteurized condiments. Sweets and Desserts Any sweets and desserts. Fats and Oils Any fats and oils. The items listed above may not be a complete list of recommended foods or beverages. Contact your dietitian for more options. What foods are not recommended? Vegetables Unpasteurized (raw) vegetable juices. Fruits Unpasteurized (raw) fruit juices. Meats and Other Protein Sources Cured meats that have nitrates, such as bacon, salami, and hotdogs. Luncheon meats, bologna, or other deli meats (unless they are reheated until they are steaming hot). Refrigerated pate, meat spreads from a meat counter, smoked seafood that is found in the refrigerated section of a store. Raw fish, such as sushi or sashimi. High mercury content fish, such as tilefish, shark, swordfish, and king mackerel. Raw meats, such as tuna or beef tartare. Undercooked meats and poultry. Make sure that all meats are cooked to food-safe temperatures. Dairy Unpasteurized (raw) milk and any foods that have raw milk in them. Soft cheeses, such as feta, queso blanco, queso fresco, Brie, Camembert cheeses, blue-veined cheeses, and Panela cheese (unless it is made with pasteurized milk, which must be stated on the label). Beverages Alcohol. Sugar-sweetened beverages, such as sodas, teas, or energy drinks. Condiments Homemade fermented foods and drinks, such as pickles, sauerkraut, or kombucha drinks. (Store-bought pasteurized versions of these are  okay.) Other Salads that are made in the store, such as ham salad, chicken salad, egg salad, tuna salad, and seafood salad. The items listed above may not be a complete list of foods and beverages to avoid. Contact your dietitian for more information. This information is not intended to replace advice given to you by your health care provider. Make sure you discuss any questions you have with your health care provider. Document Released: 08/28/2014 Document Revised: 04/20/2016 Document Reviewed: 04/28/2014 Elsevier Interactive Patient Education  2018 Else[employer] Inc[employer]   

## 2017-08-02 NOTE — MAU Note (Signed)
Pt. Is having abdominal cramping for the past 3-4 days. and pt. Has missed menstrual cycle. Pain occurs at random times.

## 2017-08-02 NOTE — MAU Provider Note (Signed)
Patient Carrie Hampton is a 26 y.o. G5P0040 At 3378w0d by uncertain LMP who is here with complaints of abdominal pain. She denies bleeding, pain with urination, and vaginal discharge.  History     CSN: [id number]  Arrival date and time: 08/02/17 1332   First Provider Initiated Contact with Patient 08/02/17 1502      Chief Complaint  Patient presents with  . Abdominal Pain    Cramping   Abdominal Pain  This is a new problem. The current episode started in the past 7 days. The onset quality is sudden. The problem occurs intermittently. The problem has been unchanged. The pain is located in the RLQ. Pain scale: it is a 0/10 now and then it will be an 8. The quality of the pain is cramping. Associated symptoms include diarrhea and vomiting.   Patient has had some on and off again nausea and diarrhea after she eats at [employer] or [employer] [place]where she works[place] She does not know when she got pregnant; she does know that she had a negative pregnancy test at the end of July.   OB History    Gravida Para Term Preterm AB Living   5       4 0   SAB TAB Ectopic Multiple Live Births   2 2            Past Medical History:  Diagnosis Date  . Hypertension   . No pertinent past medical history   . STD (female) 2013   Chlamydia    Past Surgical History:  Procedure Laterality Date  . TOOTH EXTRACTION      Family History  Problem Relation Age of Onset  . Alcohol abuse Neg Hx   . Arthritis Neg Hx   . Asthma Neg Hx   . Birth defects Neg Hx   . Cancer Neg Hx   . COPD Neg Hx   . Depression Neg Hx   . Diabetes Neg Hx   . Drug abuse Neg Hx   . Early death Neg Hx   . Hearing loss Neg Hx   . Heart disease Neg Hx   . Hyperlipidemia Neg Hx   . Hypertension Neg Hx   . Kidney disease Neg Hx   . Learning disabilities Neg Hx   . Mental illness Neg Hx   . Mental retardation Neg Hx   . Miscarriages / Stillbirths Neg Hx   . Stroke Neg Hx   . Vision loss Neg Hx     Social History  Substance  Use Topics  . Smoking status: Former Smoker    Packs/day: 1.00    Types: Cigars    Quit date: 10/20/2012  . Smokeless tobacco: Never Used  . Alcohol use No     Comment: socially    Allergies: No Known Allergies  No prescriptions prior to admission.    Review of Systems  Respiratory: Negative.   Cardiovascular: Negative.   Gastrointestinal: Positive for abdominal pain, diarrhea and vomiting.  Genitourinary: Negative.  Negative for vaginal bleeding.  Psychiatric/Behavioral: Negative.    Physical Exam   Blood pressure 100/74, pulse 88, temperature 98 F (36.7 C), temperature source Oral, resp. rate 16, height 5\' 3"  (1.6 m), weight 104 lb (47.2 kg), last menstrual period 05/17/2017, unknown if currently breastfeeding.  Physical Exam  Constitutional: She is oriented to person, place, and time. She appears well-developed and well-nourished.  Neck: Normal range of motion.  Respiratory: Effort normal.  GI: Soft.  Genitourinary: Vagina  normal.  Genitourinary Comments: NEFG; no blood or discharge in the vagina. Lesions on vaginal walls or cervix. No CMT, suprapubic or adnexal tenderness.   Musculoskeletal: Normal range of motion.  Neurological: She is alert and oriented to person, place, and time.  Skin: Skin is warm and dry.  Psychiatric: She has a normal mood and affect.    MAU Course  Procedures  MDM -beta hcg: 45,000 US Ob Comp Less 14 Wks  Result Date: 08/02/2017 CLINICAL DATA:  Pelvic pain.  First trimester pregnancy. EXAM: OBSTETRIC <14 WK [place] AND TRANSVAGINAL OB US TECHNIQUE: Both transabdominal and transvaginal ultrasound examinations were performed for complete evaluation of the gestation as well as the maternal uterus, adnexal regions, and pelvic cul-de-sac. Transvaginal technique was performed to assess early pregnancy. COMPARISON:  None. FINDINGS: Intrauterine gestational sac: Single Yolk sac:  Visualized. Embryo:  Visualized. Cardiac Activity: Visualized. Heart Rate:  93  bpm CRL:  2.6  mm   5 w   5 d                  [place] EDC: 03/30/2018 Maternal uterus/adnexae: Subchorionic hemorrhage: Small Right ovary: Normal Left ovary: Normal Other :None Free fluid:  Trace free fluid noted within the left adnexa. IMPRESSION: 1. Single living intrauterine gestation with an estimated gestational age is 5 weeks and 5 days. Please note the ultrasound gestational age is discordant with the clinical gestational age of [redacted] weeks and 0 days. 2. Small subchorionic hemorrhage. Electronically Signed   By: Signa Kell M.D.   On: 08/02/2017 16:37   US Ob Transvaginal  Result Date: 08/02/2017 CLINICAL DATA:  Pelvic pain.  First trimester pregnancy. EXAM: OBSTETRIC <14 WK [place] AND TRANSVAGINAL OB US TECHNIQUE: Both transabdominal and transvaginal ultrasound examinations were performed for complete evaluation of the gestation as well as the maternal uterus, adnexal regions, and pelvic cul-de-sac. Transvaginal technique was performed to assess early pregnancy. COMPARISON:  None. FINDINGS: Intrauterine gestational sac: Single Yolk sac:  Visualized. Embryo:  Visualized. Cardiac Activity: Visualized. Heart Rate: 93  bpm CRL:  2.6  mm   5 w   5 d                  [place] EDC: 03/30/2018 Maternal uterus/adnexae: Subchorionic hemorrhage: Small Right ovary: Normal Left ovary: Normal Other :None Free fluid:  Trace free fluid noted within the left adnexa. IMPRESSION: 1. Single living intrauterine gestation with an estimated gestational age is 5 weeks and 5 days. Please note the ultrasound gestational age is discordant with the clinical gestational age of [redacted] weeks and 0 days. 2. Small subchorionic hemorrhage. Electronically Signed   By: Signa Kell M.D.   On: 08/02/2017 16:37     Assessment and Plan   1. Less than [redacted] weeks gestation of pregnancy   2. Pelvic pain affecting pregnancy   3. Bacterial vaginosis    2. Patient's abdominal pain likely related to her diet; discussed with patient healthy dietary  choices.  3. Patient stable for discharge with RX for Flagyl and recommendations to begin prenatal care at her ob-gyn.  4. Return precautions reviewed. All questions answered.   Charlesetta Garibaldi Avalee Castrellon 08/02/2017, 3:11 PM

## 2017-08-03 LAB — GC/CHLAMYDIA PROBE AMP (~~LOC~~) NOT AT ARMC
CHLAMYDIA, DNA PROBE: NEGATIVE
NEISSERIA GONORRHEA: NEGATIVE

## 2017-08-04 ENCOUNTER — Inpatient Hospital Stay (HOSPITAL_COMMUNITY)
Admission: AD | Admit: 2017-08-04 | Discharge: 2017-08-04 | Disposition: A | Discharge status: Home / Self Care | Payer: Medicaid Other | Source: Ambulatory Visit | Attending: Obstetrics & Gynecology | Admitting: Obstetrics & Gynecology

## 2017-08-04 ENCOUNTER — Encounter (HOSPITAL_COMMUNITY): Payer: Self-pay | Admitting: *Deleted

## 2017-08-04 DIAGNOSIS — Z3A01 Less than 8 weeks gestation of pregnancy: Secondary | ICD-10-CM | POA: Diagnosis not present

## 2017-08-04 DIAGNOSIS — Z87891 Personal history of nicotine dependence: Secondary | ICD-10-CM | POA: Insufficient documentation

## 2017-08-04 DIAGNOSIS — O210 Mild hyperemesis gravidarum: Secondary | ICD-10-CM | POA: Diagnosis present

## 2017-08-04 DIAGNOSIS — O219 Vomiting of pregnancy, unspecified: Secondary | ICD-10-CM

## 2017-08-04 DIAGNOSIS — R197 Diarrhea, unspecified: Secondary | ICD-10-CM | POA: Diagnosis not present

## 2017-08-04 LAB — URINALYSIS, ROUTINE W REFLEX MICROSCOPIC
Bilirubin Urine: NEGATIVE
Glucose, UA: NEGATIVE mg/dL
HGB URINE DIPSTICK: NEGATIVE
Ketones, ur: 80 mg/dL — AB
NITRITE: NEGATIVE
Protein, ur: 30 mg/dL — AB
SPECIFIC GRAVITY, URINE: 1.027 (ref 1.005–1.030)
pH: 5 (ref 5.0–8.0)

## 2017-08-04 MED ORDER — PROMETHAZINE HCL 25 MG/ML IJ SOLN
25.0000 mg | Freq: Four times a day (QID) | INTRAMUSCULAR | Status: DC | PRN
  Administered 2017-08-04: 25 mg via INTRAVENOUS
  Filled 2017-08-04: qty 1

## 2017-08-04 MED ORDER — METOCLOPRAMIDE HCL 10 MG PO TABS: 10.0000 mg | ORAL_TABLET | Freq: Three times a day (TID) | ORAL | 0 refills | Status: DC

## 2017-08-04 MED ORDER — DEXTROSE 5 % IN LACTATED RINGERS IV BOLUS
1000.0000 mL | Freq: Once | INTRAVENOUS | Status: AC
  Administered 2017-08-04: 1000 mL via INTRAVENOUS

## 2017-08-04 MED ORDER — PROMETHAZINE HCL 25 MG PO TABS
25.0000 mg | ORAL_TABLET | Freq: Once | ORAL | Status: AC
  Administered 2017-08-04: 25 mg via ORAL
  Filled 2017-08-04: qty 1

## 2017-08-04 MED ORDER — LACTATED RINGERS IV SOLN
INTRAVENOUS | Status: DC
  Administered 2017-08-04: 20:00:00 via INTRAVENOUS

## 2017-08-04 NOTE — Discharge Instructions (Signed)

## 2017-08-04 NOTE — MAU Note (Addendum)
Pt reports nausea since 2 days ago. Pt only drinking fluids today, has not tried any solid foods. Pt has not vomited, but reports "spitting up."Pt also has had 2-3 episodes of diarrhea. Pt has not started taking her flagyl prescribed on 9/6

## 2017-08-04 NOTE — MAU Provider Note (Signed)
Chief Complaint: Fatigue and Emesis During Pregnancy   First Provider Initiated Contact with Patient 08/04/17 1929        SUBJECTIVE HPI: Carrie Hampton is a 26 y.o. [id number] at 4816w0d by LMP who presents to maternity admissions reporting nausea and diarrhea.  Has had nausea for a few days with "spitting up" of saliva at times.  Has been able to drink but did not want to eat today.  After going to work at [employer], started having diarrhea x 2-3 times.  . She denies vaginal bleeding, vaginal itching/burning, urinary symptoms, h/a, dizziness, n/v, or fever/chills.     Diarrhea   This is a new problem. The current episode started today. The problem occurs 2 to 4 times per day. Associated symptoms include vomiting. Pertinent negatives include no abdominal pain (but stomach feels gurgly), chills, coughing, fever or headaches. Nothing aggravates the symptoms. There are no known risk factors. She has tried nothing for the symptoms.  Emesis   This is a new problem. The current episode started in the past 7 days. Episode frequency: no real emesis, just saliva. The problem has been unchanged. There has been no fever. Associated symptoms include diarrhea. Pertinent negatives include no abdominal pain (but stomach feels gurgly), chills, coughing, fever or headaches. She has tried nothing for the symptoms.    RN Note: Pt reports nausea since 2 days ago. Pt only drinking fluids today, has not tried any solid foods. Pt has not vomited, but reports "spitting up."Pt also has had 2-3 episodes of diarrhea. Pt has not started taking her flagyl prescribed on 9/6  Past Medical History:  Diagnosis Date  . Hypertension   . No pertinent past medical history   . STD (female) 2013   Chlamydia   Past Surgical History:  Procedure Laterality Date  . TOOTH EXTRACTION     Social History   Social History  . Marital status: Single    Spouse name: N/A  . Number of children: N/A  . Years of education: N/A   Occupational  History  . Not on file.   Social History Main Topics  . Smoking status: Former Smoker    Packs/day: 1.00    Types: Cigars    Quit date: 10/20/2012  . Smokeless tobacco: Never Used  . Alcohol use No     Comment: socially  . Drug use: No  . Sexual activity: Yes    Birth control/ protection: None   Other Topics Concern  . Not on file   Social History Narrative  . No narrative on file   No current facility-administered medications on file prior to encounter.    Current Outpatient Prescriptions on File Prior to Encounter  Medication Sig Dispense Refill  . metroNIDAZOLE (FLAGYL) 500 MG tablet Take 1 tablet (500 mg total) by mouth 3 (three) times daily. 21 tablet 0   No Known Allergies  I have reviewed patient's Past Medical Hx, Surgical Hx, Family Hx, Social Hx, medications and allergies.   ROS:  Review of Systems  Constitutional: Negative for chills and fever.  Respiratory: Negative for cough.   Gastrointestinal: Positive for diarrhea and vomiting. Negative for abdominal pain (but stomach feels gurgly).  Neurological: Negative for headaches.   Review of Systems  Other systems negative   Physical Exam  Physical Exam Patient Vitals for the past 24 hrs:  BP Temp Pulse Resp Height Weight  08/04/17 1916 116/76 97.8 F (36.6 C) 80 16 - -  08/04/17 1913 - - - -   (1.6 m) 102 lb 12 oz (46.6 kg)   Constitutional: Well-developed, well-nourished female in no acute distress.  Cardiovascular: normal rate and rhythm, no tachycardia Respiratory: normal effort GI: Abd soft, non-tender. Pos BS x 4 MS: Extremities nontender, no edema, normal ROM Neurologic: Alert and oriented x 4.  GU: Neg CVAT.  PELVIC EXAM: not indicated  LAB RESULTS No results found for this or any previous visit (from the past 24 hour(s)).  --/--/O POS (09/06 1523)  IMAGING US Ob Comp Less 14 Wks  Result Date: 08/02/2017 CLINICAL DATA:  Pelvic pain.  First trimester pregnancy. EXAM: OBSTETRIC <14  WK [place] AND TRANSVAGINAL OB US TECHNIQUE: Both transabdominal and transvaginal ultrasound examinations were performed for complete evaluation of the gestation as well as the maternal uterus, adnexal regions, and pelvic cul-de-sac. Transvaginal technique was performed to assess early pregnancy. COMPARISON:  None. FINDINGS: Intrauterine gestational sac: Single Yolk sac:  Visualized. Embryo:  Visualized. Cardiac Activity: Visualized. Heart Rate: 93  bpm CRL:  2.6  mm   5 w   5 d                  [place] EDC: 03/30/2018 Maternal uterus/adnexae: Subchorionic hemorrhage: Small Right ovary: Normal Left ovary: Normal Other :None Free fluid:  Trace free fluid noted within the left adnexa. IMPRESSION: 1. Single living intrauterine gestation with an estimated gestational age is 5 weeks and 5 days. Please note the ultrasound gestational age is discordant with the clinical gestational age of [redacted] weeks and 0 days. 2. Small subchorionic hemorrhage. Electronically Signed   By: Signa Kell M.D.   On: 08/02/2017 16:37   US Ob Transvaginal  Result Date: 08/02/2017 CLINICAL DATA:  Pelvic pain.  First trimester pregnancy. EXAM: OBSTETRIC <14 WK [place] AND TRANSVAGINAL OB US TECHNIQUE: Both transabdominal and transvaginal ultrasound examinations were performed for complete evaluation of the gestation as well as the maternal uterus, adnexal regions, and pelvic cul-de-sac. Transvaginal technique was performed to assess early pregnancy. COMPARISON:  None. FINDINGS: Intrauterine gestational sac: Single Yolk sac:  Visualized. Embryo:  Visualized. Cardiac Activity: Visualized. Heart Rate: 93  bpm CRL:  2.6  mm   5 w   5 d                  [place] EDC: 03/30/2018 Maternal uterus/adnexae: Subchorionic hemorrhage: Small Right ovary: Normal Left ovary: Normal Other :None Free fluid:  Trace free fluid noted within the left adnexa. IMPRESSION: 1. Single living intrauterine gestation with an estimated gestational age is 5 weeks and 5 days. Please note the  ultrasound gestational age is discordant with the clinical gestational age of [redacted] weeks and 0 days. 2. Small subchorionic hemorrhage. Electronically Signed   By: Signa Kell M.D.   On: 08/02/2017 16:37    MAU Management/MDM: Jamal Maes giving her a Phenergan tablet but she vomited it up Will start IV fluids and give her Phenergan IV Will get orthostatic VS   ASSESSMENT Nausea and vomiting with diarrhea Suspect gastroenteritis with pregnancy nausea   PLAN Care turned over to oncoming provider  Wynelle Bourgeois CNM, MSN Certified Nurse-Midwife 08/04/2017  7:30 PM   D5LR bolus X 1 Patient tolerating PO fluids, feels better  A:  1. Nausea and vomiting in pregnancy     P:  Discharge home in stable condition Rx: Reglan  Return to MAU if symptoms worsen  Small, frequent meals   Rasch, Harolyn Rutherford, NP 08/04/2017 11:09 PM

## 2017-08-30 ENCOUNTER — Inpatient Hospital Stay (HOSPITAL_COMMUNITY)
Admission: AD | Admit: 2017-08-30 | Discharge: 2017-08-30 | Disposition: A | Discharge status: Home / Self Care | Payer: 59 | Source: Ambulatory Visit | Attending: Obstetrics and Gynecology | Admitting: Obstetrics and Gynecology

## 2017-08-30 DIAGNOSIS — Z87891 Personal history of nicotine dependence: Secondary | ICD-10-CM | POA: Diagnosis not present

## 2017-08-30 DIAGNOSIS — O219 Vomiting of pregnancy, unspecified: Secondary | ICD-10-CM | POA: Diagnosis not present

## 2017-08-30 DIAGNOSIS — Z3A09 9 weeks gestation of pregnancy: Secondary | ICD-10-CM | POA: Diagnosis not present

## 2017-08-30 DIAGNOSIS — R109 Unspecified abdominal pain: Secondary | ICD-10-CM | POA: Insufficient documentation

## 2017-08-30 DIAGNOSIS — O26891 Other specified pregnancy related conditions, first trimester: Secondary | ICD-10-CM | POA: Diagnosis not present

## 2017-08-30 DIAGNOSIS — B9689 Other specified bacterial agents as the cause of diseases classified elsewhere: Secondary | ICD-10-CM

## 2017-08-30 DIAGNOSIS — O210 Mild hyperemesis gravidarum: Secondary | ICD-10-CM | POA: Diagnosis not present

## 2017-08-30 DIAGNOSIS — N760 Acute vaginitis: Secondary | ICD-10-CM

## 2017-08-30 LAB — URINALYSIS, ROUTINE W REFLEX MICROSCOPIC
Bilirubin Urine: NEGATIVE
GLUCOSE, UA: NEGATIVE mg/dL
HGB URINE DIPSTICK: NEGATIVE
KETONES UR: NEGATIVE mg/dL
NITRITE: NEGATIVE
PH: 5 (ref 5.0–8.0)
PROTEIN: 30 mg/dL — AB
Specific Gravity, Urine: 1.029 (ref 1.005–1.030)

## 2017-08-30 MED ORDER — PROMETHAZINE HCL 25 MG PO TABS: 25.0000 mg | ORAL_TABLET | Freq: Four times a day (QID) | ORAL | 0 refills | Status: DC | PRN

## 2017-08-30 MED ORDER — METRONIDAZOLE 0.75 % VA GEL: 1.0000 | Freq: Two times a day (BID) | VAGINAL | 0 refills | Status: DC

## 2017-08-30 NOTE — MAU Provider Note (Signed)
History     CSN: [id number]  Arrival date and time: 08/30/17 0865   First Provider Initiated Contact with Patient 08/30/17 1840      Chief Complaint  Patient presents with  . Abdominal Pain  . Vaginal Discharge   HPI [name] is a 26 y.o. [id number] at [redacted]w[redacted]d who presents to MAU today with complaint of intermittent lower abdominal cramping. She states worsening symptoms today. She denies vaginal bleeding, but has an increased amount of white discharge with odor. She was diagnosed with BV last month and did not pick up Rx. She continues to have frequent nausea and occasional vomiting. She feels decrease in appetite as well.   OB History    Gravida Para Term Preterm AB Living   5       4 0   SAB TAB Ectopic Multiple Live Births   2 2            Past Medical History:  Diagnosis Date  . Hypertension   . No pertinent past medical history   . STD (female) 2013   Chlamydia    Past Surgical History:  Procedure Laterality Date  . TOOTH EXTRACTION      Family History  Problem Relation Age of Onset  . Alcohol abuse Neg Hx   . Arthritis Neg Hx   . Asthma Neg Hx   . Birth defects Neg Hx   . Cancer Neg Hx   . COPD Neg Hx   . Depression Neg Hx   . Diabetes Neg Hx   . Drug abuse Neg Hx   . Early death Neg Hx   . Hearing loss Neg Hx   . Heart disease Neg Hx   . Hyperlipidemia Neg Hx   . Hypertension Neg Hx   . Kidney disease Neg Hx   . Learning disabilities Neg Hx   . Mental illness Neg Hx   . Mental retardation Neg Hx   . Miscarriages / Stillbirths Neg Hx   . Stroke Neg Hx   . Vision loss Neg Hx     Social History  Substance Use Topics  . Smoking status: Former Smoker    Packs/day: 1.00    Types: Cigars    Quit date: 10/20/2012  . Smokeless tobacco: Never Used  . Alcohol use No     Comment: socially    Allergies: No Known Allergies  Prescriptions Prior to Admission  Medication Sig Dispense Refill Last Dose  . metoCLOPramide (REGLAN) 10 MG tablet  Take 1 tablet (10 mg total) by mouth 4 (four) times daily -  before meals and at bedtime. 90 tablet 0     Review of Systems  Constitutional: Negative for fever.  Gastrointestinal: Negative for abdominal pain, constipation, diarrhea, nausea and vomiting.  Genitourinary: Positive for pelvic pain and vaginal discharge. Negative for dysuria, frequency, urgency and vaginal bleeding.   Physical Exam   Blood pressure 115/60, pulse 90, temperature 98.4 F (36.9 C), temperature source Oral, resp. rate 16, last menstrual period 05/17/2017, SpO2 97 %, unknown if currently breastfeeding.  Physical Exam  Nursing note and vitals reviewed. Constitutional: She is oriented to person, place, and time. She appears well-developed and well-nourished. No distress.  HENT:  Head: Normocephalic and atraumatic.  Cardiovascular: Normal rate.   Respiratory: Effort normal.  GI: Soft. She exhibits no distension and no mass. There is no tenderness. There is no rebound and no guarding.  Neurological: She is alert and oriented to person, place, and time.  Skin: Skin is warm and dry. No erythema.  Psychiatric: She has a normal mood and affect.    Results for orders placed or performed during the hospital encounter of 08/30/17 (from the past 24 hour(s))  Urinalysis, Routine w reflex microscopic     Status: Abnormal   Collection Time: 08/30/17  6:39 PM  Result Value Ref Range   Color, Urine YELLOW YELLOW   APPearance CLOUDY (A) CLEAR   Specific Gravity, Urine 1.029 1.005 - 1.030   pH 5.0 5.0 - 8.0   Glucose, UA NEGATIVE NEGATIVE mg/dL   Hgb urine dipstick NEGATIVE NEGATIVE   Bilirubin Urine NEGATIVE NEGATIVE   Ketones, ur NEGATIVE NEGATIVE mg/dL   Protein, ur 30 (A) NEGATIVE mg/dL   Nitrite NEGATIVE NEGATIVE   Leukocytes, UA MODERATE (A) NEGATIVE   RBC / HPF 0-5 0 - 5 RBC/hpf   WBC, UA 6-30 0 - 5 WBC/hpf   Bacteria, UA FEW (A) NONE SEEN   Squamous Epithelial / LPF TOO NUMEROUS TO COUNT (A) NONE SEEN   Mucus  PRESENT     MAU Course  Procedures Pt informed that the ultrasound is considered a limited OB ultrasound and is not intended to be a complete ultrasound exam.  Patient also informed that the ultrasound is not being completed with the intent of assessing for fetal or placental anomalies or any pelvic abnormalities.  Explained that the purpose of today's ultrasound is to assess for  viability.  Patient acknowledges the purpose of the exam and the limitations of the study.    Fetal movement and +FHR noted  MDM UA today - without evidence of dehydration. Will trial Phenergan at home.   Assessment and Plan  A: SIUP at [redacted]w[redacted]d Nausea and vomiting in pregnancy prior to [redacted] weeks gestation  Abdominal pain in pregnancy, first trimester   P:  Discharge home Rx for Phenergan and Metrogel given to patient  Advised Phenergan can also be taken as suppository for nausea  First trimester precautions discussed Discussed diet for N/V in pregnancy, also included on AVS Patient advised to follow-up with [facility] as planned to start prenatal care  Patient may return to MAU as needed or if her condition were to change or worsen  Vonzella Nipple, PA-C 08/30/2017, 7:02 PM

## 2017-08-30 NOTE — Discharge Instructions (Signed)
Eating Plan for Nausea and Vomiting in pregnancy Bacterial Vaginosis Bacterial vaginosis is an infection of the vagina. It happens when too many germs (bacteria) grow in the vagina. This infection puts you at risk for infections from sex (STIs). Treating this infection can lower your risk for some STIs. You should also treat this if you are pregnant. It can cause your baby to be born early. Follow these instructions at home: Medicines  Take over-the-counter and prescription medicines only as told by your doctor.  Take or use your antibiotic medicine as told by your doctor. Do not stop taking or using it even if you start to feel better. General instructions  If you your sexual partner is a woman, tell her that you have this infection. She needs to get treatment if she has symptoms. If you have a female partner, he does not need to be treated.  During treatment: ? Avoid sex. ? Do not douche. ? Avoid alcohol as told. ? Avoid breastfeeding as told.  Drink enough fluid to keep your pee (urine) clear or pale yellow.  Keep your vagina and butt (rectum) clean. ? Wash the area with warm water every day. ? Wipe from front to back after you use the toilet.  Keep all follow-up visits as told by your doctor. This is important. Preventing this condition  Do not douche.  Use only warm water to wash around your vagina.  Use protection when you have sex. This includes: ? Latex condoms. ? Dental dams.  Limit how many people you have sex with. It is best to only have sex with the same person (be monogamous).  Get tested for STIs. Have your partner get tested.  Wear underwear that is cotton or lined with cotton.  Avoid tight pants and pantyhose. This is most important in summer.  Do not use any products that have nicotine or tobacco in them. These include cigarettes and e-cigarettes. If you need help quitting, ask your doctor.  Do not use illegal drugs.  Limit how much alcohol you  drink. Contact a doctor if:  Your symptoms do not get better, even after you are treated.  You have more discharge or pain when you pee (urinate).  You have a fever.  You have pain in your belly (abdomen).  You have pain with sex.  Your bleed from your vagina between periods. Summary  This infection happens when too many germs (bacteria) grow in the vagina.  Treating this condition can lower your risk for some infections from sex (STIs).  You should also treat this if you are pregnant. It can cause early (premature) birth.  Do not stop taking or using your antibiotic medicine even if you start to feel better. This information is not intended to replace advice given to you by your health care provider. Make sure you discuss any questions you have with your health care provider. Document Released: 08/22/2008 Document Revised: 07/29/2016 Document Reviewed: 07/29/2016 Elsevier Interactive Patient Education  2017 Else[employer] Inc.  Hyperemesis gravidarum is a severe form of morning sickness. Because this condition causes severe nausea and vomiting, it can lead to dehydration, malnutrition, and weight loss. One way to lessen the symptoms of nausea and vomiting is to follow the eating plan for hyperemesis gravidarum. It is often used along with prescribed medicines to control your symptoms. What can I do to relieve my symptoms? Listen to your body. Everyone is different and has different preferences. Find what works best for you. Take any of the  following actions that are helpful to you:  Eat and drink slowly.  Eat 5-6 small meals daily instead of 3 large meals.  Eat crackers before you get out of bed in the morning.  Try having a snack in the middle of the night.  Starchy foods are usually tolerated well. Examples include cereal, toast, bread, potatoes, pasta, rice, and pretzels.  Ginger may help with nausea. Add  tsp ground ginger to hot tea or choose ginger tea.  Try drinking  100% fruit juice or an electrolyte drink. An electrolyte drink contains sodium, potassium, and chloride.  Continue to take your prenatal vitamins as told by your health care provider. If you are having trouble taking your prenatal vitamins, talk with your health care provider about different options.  Include at least 1 serving of protein with your meals and snacks. Protein options include meats or poultry, beans, nuts, eggs, and yogurt. Try eating a protein-rich snack before bed. Examples of these snacks include cheese and crackers or half of a peanut butter or [place] sandwich.  Consider eliminating foods that trigger your symptoms. These may include spicy foods, coffee, high-fat foods, very sweet foods, and acidic foods.  Try meals that have more protein combined with bland, salty, lower-fat, and dry foods, such as nuts, seeds, pretzels, crackers, and cereal.  Talk with your healthcare provider about starting a supplement of vitamin B6.  Have fluids that are cold, clear, and carbonated or sour. Examples include lemonade, ginger ale, lemon-lime soda, ice water, and sparkling water.  Try lemon or mint tea.  Try brushing your teeth or using a mouth rinse after meals.  What should I avoid to reduce my symptoms? Avoiding some of the following things may help reduce your symptoms.  Foods with strong smells. Try eating meals in well-ventilated areas that are free of odors.  Drinking water or other beverages with meals. Try not to drink anything during the 30 minutes before and after your meals.  Drinking more than 1 cup of fluid at a time. Sometimes using a straw helps.  Fried or high-fat foods, such as butter and cream sauces.  Spicy foods.  Skipping meals as best as you can. Nausea can be more intense on an empty stomach. If you cannot tolerate food at that time, do not force it. Try sucking on ice chips or other frozen items, and make up for missed calories later.  Lying down within 2  hours after eating.  Environmental triggers. These may include smoky rooms, closed spaces, rooms with strong smells, warm or humid places, overly loud and noisy rooms, and rooms with motion or flickering lights.  Quick and sudden changes in your movement.  This information is not intended to replace advice given to you by your health care provider. Make sure you discuss any questions you have with your health care provider. Document Released: 09/10/2007 Document Revised: 07/12/2016 Document Reviewed: 06/13/2016 Elsevier Interactive Patient Education  [employer].

## 2017-08-30 NOTE — MAU Note (Signed)
+  lower abdominal pain Rating pain 7/10 Cramping in nature Intermittent  +vaginal discharge White and thin Unable to fill prescription from last visit  Denies vaginal bleeding.

## 2017-09-17 ENCOUNTER — Other Ambulatory Visit (HOSPITAL_COMMUNITY): Payer: Self-pay | Admitting: Nurse Practitioner

## 2017-09-17 DIAGNOSIS — Z369 Encounter for antenatal screening, unspecified: Secondary | ICD-10-CM

## 2017-09-17 DIAGNOSIS — Z3682 Encounter for antenatal screening for nuchal translucency: Secondary | ICD-10-CM

## 2017-09-17 DIAGNOSIS — Z3A13 13 weeks gestation of pregnancy: Secondary | ICD-10-CM

## 2017-09-26 ENCOUNTER — Ambulatory Visit (HOSPITAL_COMMUNITY)
Admission: RE | Admit: 2017-09-26 | Discharge: 2017-09-26 | Disposition: A | Discharge status: Home / Self Care | Payer: 59 | Source: Ambulatory Visit | Attending: Nurse Practitioner | Admitting: Nurse Practitioner

## 2017-09-26 ENCOUNTER — Encounter (HOSPITAL_COMMUNITY): Payer: Self-pay

## 2017-09-26 DIAGNOSIS — Z3A13 13 weeks gestation of pregnancy: Secondary | ICD-10-CM | POA: Insufficient documentation

## 2017-09-26 DIAGNOSIS — Z369 Encounter for antenatal screening, unspecified: Secondary | ICD-10-CM

## 2017-09-26 DIAGNOSIS — Z3682 Encounter for antenatal screening for nuchal translucency: Secondary | ICD-10-CM | POA: Insufficient documentation

## 2017-10-04 ENCOUNTER — Inpatient Hospital Stay (HOSPITAL_COMMUNITY)
Admission: AD | Admit: 2017-10-04 | Discharge: 2017-10-04 | Disposition: A | Discharge status: Home / Self Care | Payer: 59 | Source: Ambulatory Visit | Attending: Obstetrics & Gynecology | Admitting: Obstetrics & Gynecology

## 2017-10-04 ENCOUNTER — Other Ambulatory Visit: Payer: Self-pay

## 2017-10-04 DIAGNOSIS — Z87891 Personal history of nicotine dependence: Secondary | ICD-10-CM | POA: Insufficient documentation

## 2017-10-04 DIAGNOSIS — Z3A14 14 weeks gestation of pregnancy: Secondary | ICD-10-CM | POA: Insufficient documentation

## 2017-10-04 DIAGNOSIS — R109 Unspecified abdominal pain: Secondary | ICD-10-CM | POA: Insufficient documentation

## 2017-10-04 DIAGNOSIS — O26892 Other specified pregnancy related conditions, second trimester: Secondary | ICD-10-CM | POA: Diagnosis not present

## 2017-10-04 DIAGNOSIS — O26899 Other specified pregnancy related conditions, unspecified trimester: Secondary | ICD-10-CM

## 2017-10-04 DIAGNOSIS — R140 Abdominal distension (gaseous): Secondary | ICD-10-CM

## 2017-10-04 LAB — URINALYSIS, ROUTINE W REFLEX MICROSCOPIC
BILIRUBIN URINE: NEGATIVE
GLUCOSE, UA: NEGATIVE mg/dL
HGB URINE DIPSTICK: NEGATIVE
KETONES UR: NEGATIVE mg/dL
NITRITE: NEGATIVE
PROTEIN: NEGATIVE mg/dL
Specific Gravity, Urine: 1.024 (ref 1.005–1.030)
pH: 6 (ref 5.0–8.0)

## 2017-10-04 MED ORDER — SIMETHICONE 80 MG PO CHEW: 80.0000 mg | CHEWABLE_TABLET | Freq: Four times a day (QID) | ORAL | 0 refills | Status: DC | PRN

## 2017-10-04 NOTE — MAU Note (Signed)
Pt reports she has had abd cramping on and off since yesterday. Denies any vaginal bleeding or discharge

## 2017-10-04 NOTE — Discharge Instructions (Signed)
Abdominal Bloating When you have abdominal bloating, your abdomen may feel full, tight, or painful. It may also look bigger than normal or swollen (distended). Common causes of abdominal bloating include:  Swallowing air.  Constipation.  Problems digesting food.  Eating too much.  Irritable bowel syndrome. This is a condition that affects the large intestine.  Lactose intolerance. This is an inability to digest lactose, a natural sugar in dairy products.  Celiac disease. This is a condition that affects the ability to digest gluten, a protein found in some grains.  Gastroparesis. This is a condition that slows down the movement of food in the stomach and small intestine. It is more common in people with diabetes mellitus.  Gastroesophageal reflux disease (GERD). This is a digestive condition that makes stomach acid flow back into the esophagus.  Urinary retention. This means that the body is holding onto urine, and the bladder cannot be emptied all the way.  Follow these instructions at home: Eating and drinking  Avoid eating too much.  Try not to swallow air while talking or eating.  Avoid eating while lying down.  Avoid these foods and drinks: ? Foods that cause gas, such as broccoli, cabbage, cauliflower, and baked beans. ? Carbonated drinks. ? Hard candy. ? Chewing gum. Medicines  Take over-the-counter and prescription medicines only as told by your health care provider.  Take probiotic medicines. These medicines contain live bacteria or yeasts that can help digestion.  Take coated peppermint oil capsules. Activity  Try to exercise regularly. Exercise may help to relieve bloating that is caused by gas and relieve constipation. General instructions  Keep all follow-up visits as told by your health care provider. This is important. Contact a health care provider if:  You have nausea and vomiting.  You have diarrhea.  You have abdominal pain.  You have  unusual weight loss or weight gain.  You have severe pain, and medicines do not help. Get help right away if:  You have severe chest pain.  You have trouble breathing.  You have shortness of breath.  You have trouble urinating.  You have darker urine than normal.  You have blood in your stools or have dark, tarry stools. Summary  Abdominal bloating means that the abdomen is swollen.  Common causes of abdominal bloating are swallowing air, constipation, and problems digesting food.  Avoid eating too much and avoid swallowing air.  Avoid foods that cause gas, carbonated drinks, hard candy, and chewing gum. This information is not intended to replace advice given to you by your health care provider. Make sure you discuss any questions you have with your health care provider. Document Released: 12/15/2016 Document Revised: 12/15/2016 Document Reviewed: 12/15/2016 Elsevier Interactive Patient Education  2018 Elsevier Inc.   Abdominal Pain During Pregnancy Abdominal pain is common in pregnancy. Most of the time, it does not cause harm. There are many causes of abdominal pain. Some causes are more serious than others and sometimes the cause is not known. Abdominal pain can be a sign that something is very wrong with the pregnancy or the pain may have nothing to do with the pregnancy. Always tell your health care provider if you have any abdominal pain. Follow these instructions at home:  Do not have sex or put anything in your vagina until your symptoms go away completely.  Watch your abdominal pain for any changes.  Get plenty of rest until your pain improves.  Drink enough fluid to keep your urine clear or pale  yellow.  Take over-the-counter or prescription medicines only as told by your health care provider.  Keep all follow-up visits as told by your health care provider. This is important. Contact a health care provider if:  You have a fever.  Your pain gets worse or  you have cramping.  Your pain continues after resting. Get help right away if:  You are bleeding, leaking fluid, or passing tissue from the vagina.  You have vomiting or diarrhea that does not go away.  You have painful or bloody urination.  You notice a decrease in your baby's movements.  You feel very weak or faint.  You have shortness of breath.  You develop a severe headache with abdominal pain.  You have abnormal vaginal discharge with abdominal pain. This information is not intended to replace advice given to you by your health care provider. Make sure you discuss any questions you have with your health care provider. Document Released: 11/13/2005 Document Revised: 08/24/2016 Document Reviewed: 06/12/2013 Elsevier Interactive Patient Education  [employer]2018 [employer].

## 2017-10-04 NOTE — MAU Provider Note (Signed)
Chief Complaint: Abdominal Pain   First Provider Initiated Contact with Patient 10/04/17 1640        SUBJECTIVE HPI: Carrie Hampton is a 26 y.o. [id number] at 7231w5d by LMP who presents to maternity admissions reporting intermittent cramping in upper and lower abdomen. Moves around.  No bleeding.  Does have some constipation off and on.  No dysuria.. She denies vaginal bleeding, vaginal itching/burning, urinary symptoms, h/a, dizziness, n/v, or fever/chills.    Abdominal Pain  This is a new problem. The current episode started in the past 7 days. The onset quality is gradual. The problem occurs intermittently. The problem has been waxing and waning. The pain is located in the generalized abdominal region. The pain is mild. The quality of the pain is cramping. The abdominal pain does not radiate. Associated symptoms include constipation. Pertinent negatives include no diarrhea, dysuria, fever, frequency, myalgias, nausea or vomiting. Nothing aggravates the pain. The pain is relieved by nothing. She has tried nothing for the symptoms.    RN note: Pt reports she has had abd cramping on and off since yesterday. Denies any vaginal bleeding or discharge    Past Medical History:  Diagnosis Date  . Hypertension   . No pertinent past medical history   . STD (female) 2013   Chlamydia   Past Surgical History:  Procedure Laterality Date  . TOOTH EXTRACTION     Social History   Socioeconomic History  . Marital status: Single    Spouse name: Not on file  . Number of children: Not on file  . Years of education: Not on file  . Highest education level: Not on file  Social Needs  . Financial resource strain: Not on file  . Food insecurity - worry: Not on file  . Food insecurity - inability: Not on file  . Transportation needs - medical: Not on file  . Transportation needs - non-medical: Not on file  Occupational History  . Not on file  Tobacco Use  . Smoking status: Former Smoker   Packs/day: 1.00    Types: Cigars    Last attempt to quit: 10/20/2012    Years since quitting: 4.9  . Smokeless tobacco: Never Used  Substance and Sexual Activity  . Alcohol use: No    Comment: socially  . Drug use: No  . Sexual activity: Yes    Birth control/protection: None  Other Topics Concern  . Not on file  Social History Narrative  . Not on file   No current facility-administered medications on file prior to encounter.    Current Outpatient Medications on File Prior to Encounter  Medication Sig Dispense Refill  . metroNIDAZOLE (METROGEL VAGINAL) 0.75 % vaginal gel Place 1 Applicatorful vaginally 2 (two) times daily. (Patient not taking: Reported on 09/26/2017) 70 g 0  . promethazine (PHENERGAN) 25 MG tablet Take 1 tablet (25 mg total) by mouth every 6 (six) hours as needed for nausea or vomiting. (Patient not taking: Reported on 09/26/2017) 30 tablet 0   No Known Allergies  I have reviewed patient's Past Medical Hx, Surgical Hx, Family Hx, Social Hx, medications and allergies.   ROS:  Review of Systems  Constitutional: Negative for fever.  Gastrointestinal: Positive for abdominal pain and constipation. Negative for diarrhea, nausea and vomiting.  Genitourinary: Negative for dysuria and frequency.  Musculoskeletal: Negative for myalgias.    Other systems negative   Physical Exam  Physical Exam Patient Vitals for the past 24 hrs:  BP Temp Temp src Pulse  Resp Height Weight  10/04/17 1615 112/65 98.6 F (37 C) Oral 89 18 5\' 3"  (1.6 m) 103 lb (46.7 kg)   Constitutional: Well-developed, well-nourished female in no acute distress.  Cardiovascular: normal rate Respiratory: normal effort GI: Abd soft, non-tender. Pos BS x 4   Generalized abdominal bloating noted.  No rebound or guarding MS: Extremities nontender, no edema, normal ROM Neurologic: Alert and oriented x 4.  GU: Neg CVAT.  PELVIC EXAM: Cervix long and closed.  No cervical motion tenderness  FHT 150 by  doppler  LAB RESULTS Results for orders placed or performed during the hospital encounter of 10/04/17 (from the past 24 hour(s))  Urinalysis, Routine w reflex microscopic     Status: Abnormal   Collection Time: 10/04/17  4:18 PM  Result Value Ref Range   Color, Urine YELLOW YELLOW   APPearance HAZY (A) CLEAR   Specific Gravity, Urine 1.024 1.005 - 1.030   pH 6.0 5.0 - 8.0   Glucose, UA NEGATIVE NEGATIVE mg/dL   Hgb urine dipstick NEGATIVE NEGATIVE   Bilirubin Urine NEGATIVE NEGATIVE   Ketones, ur NEGATIVE NEGATIVE mg/dL   Protein, ur NEGATIVE NEGATIVE mg/dL   Nitrite NEGATIVE NEGATIVE   Leukocytes, UA SMALL (A) NEGATIVE   RBC / HPF 0-5 0 - 5 RBC/hpf   WBC, UA 6-30 0 - 5 WBC/hpf   Bacteria, UA RARE (A) NONE SEEN   Squamous Epithelial / LPF 6-30 (A) NONE SEEN   Mucus PRESENT     --/--/O POS (09/06 1523)  IMAGING   MAU Management/MDM: Urine tested for evaluation of possible UTI, negative for that Cervix is long and closed Doubt preterm uterine irritability Recent STD testing was negative Suspect bloating and gas pains are source.  Does have intermittent constipation.  Discussed fiber and water intake. Will add Mylicon to break up gas bubbles  ASSESSMENT Single IUP at 5742w5d Abdominal pain with bloating, suspect gas pains No evidence of preterm labor or UTI  PLAN Discharge home Rx Mylicon for gas pains Add fiber to diet, may use fiber laxatives if unable to add to diet PTL precautions.  Follow up in clinic  Pt stable at time of discharge. Encouraged to return here or to other Urgent Care/ED if she develops worsening of symptoms, increase in pain, fever, or other concerning symptoms.    Wynelle BourgeoisMarie Cheryn Lundquist CNM, MSN Certified Nurse-Midwife 10/04/2017  4:40 PM

## 2017-10-10 ENCOUNTER — Other Ambulatory Visit: Payer: Self-pay

## 2017-10-14 ENCOUNTER — Inpatient Hospital Stay (HOSPITAL_COMMUNITY)
Admission: AD | Admit: 2017-10-14 | Discharge: 2017-10-14 | Disposition: A | Discharge status: Home / Self Care | Payer: 59 | Source: Ambulatory Visit | Attending: Obstetrics and Gynecology | Admitting: Obstetrics and Gynecology

## 2017-10-14 ENCOUNTER — Encounter (HOSPITAL_COMMUNITY): Payer: Self-pay

## 2017-10-14 ENCOUNTER — Other Ambulatory Visit: Payer: Self-pay

## 2017-10-14 DIAGNOSIS — O26892 Other specified pregnancy related conditions, second trimester: Secondary | ICD-10-CM | POA: Insufficient documentation

## 2017-10-14 DIAGNOSIS — Z87891 Personal history of nicotine dependence: Secondary | ICD-10-CM | POA: Diagnosis not present

## 2017-10-14 DIAGNOSIS — R109 Unspecified abdominal pain: Secondary | ICD-10-CM

## 2017-10-14 DIAGNOSIS — O162 Unspecified maternal hypertension, second trimester: Secondary | ICD-10-CM | POA: Insufficient documentation

## 2017-10-14 DIAGNOSIS — R1030 Lower abdominal pain, unspecified: Secondary | ICD-10-CM | POA: Insufficient documentation

## 2017-10-14 DIAGNOSIS — Z3A16 16 weeks gestation of pregnancy: Secondary | ICD-10-CM | POA: Insufficient documentation

## 2017-10-14 DIAGNOSIS — O26899 Other specified pregnancy related conditions, unspecified trimester: Secondary | ICD-10-CM

## 2017-10-14 DIAGNOSIS — R102 Pelvic and perineal pain: Secondary | ICD-10-CM | POA: Insufficient documentation

## 2017-10-14 LAB — URINALYSIS, ROUTINE W REFLEX MICROSCOPIC
Bilirubin Urine: NEGATIVE
Glucose, UA: NEGATIVE mg/dL
Hgb urine dipstick: NEGATIVE
KETONES UR: NEGATIVE mg/dL
Nitrite: NEGATIVE
PH: 5 (ref 5.0–8.0)
Protein, ur: 30 mg/dL — AB
SPECIFIC GRAVITY, URINE: 1.026 (ref 1.005–1.030)

## 2017-10-14 NOTE — Progress Notes (Addendum)
G5P0 @ 16.[redacted] wksga. Presents to triage for abd pain since this morning. Ate a donut on the way to hospital. Denies LOF or bleeding.   Doppler FH done by charge nurse. 145. See flow sheet.   1530: provider at bs assessing pt.   1637: Discharge instructions given with pt understanding. Pt left unit via ambulatory

## 2017-10-14 NOTE — MAU Note (Signed)
Went to work this morning, started having pain in abd, mid abd and down. No bleeding.

## 2017-10-14 NOTE — MAU Provider Note (Signed)
Chief Complaint: Abdominal Pain   First Provider Initiated Contact with Patient 10/14/17 1528    SUBJECTIVE HPI: [name] is a 26 y.o. [id number] at 7261w1d by LMP who presents to maternity admissions reporting abdominal cramping in lower abdomen and radiates to bilateral sides. Reports that it is sharp stabbing pain when she moves. Started to occur today while she was at work. Works at [employer] where she moves around and turns a lot. Has not tried any treatments and does not have any associated symptoms with the pain. She denies vaginal bleeding, vaginal itching/burning, urinary symptoms, h/a, dizziness, n/v, or fever/chills.     Past Medical History:  Diagnosis Date  . Hypertension   . No pertinent past medical history   . STD (female) 2013   Chlamydia   Past Surgical History:  Procedure Laterality Date  . TOOTH EXTRACTION     Social History   Socioeconomic History  . Marital status: Single    Spouse name: Not on file  . Number of children: Not on file  . Years of education: Not on file  . Highest education level: Not on file  Social Needs  . Financial resource strain: Not on file  . Food insecurity - worry: Not on file  . Food insecurity - inability: Not on file  . Transportation needs - medical: Not on file  . Transportation needs - non-medical: Not on file  Occupational History  . Not on file  Tobacco Use  . Smoking status: Former Smoker    Packs/day: 1.00    Types: Cigars    Last attempt to quit: 10/20/2012    Years since quitting: 4.9  . Smokeless tobacco: Never Used  Substance and Sexual Activity  . Alcohol use: No    Comment: socially  . Drug use: No  . Sexual activity: Yes    Birth control/protection: None  Other Topics Concern  . Not on file  Social History Narrative  . Not on file   No current facility-administered medications on file prior to encounter.    Current Outpatient Medications on File Prior to Encounter  Medication Sig Dispense Refill   . metroNIDAZOLE (FLAGYL) 250 MG tablet Take 250 mg 2 (two) times daily by mouth.    . Prenatal Vit-Fe Fumarate-FA (PRENATAL MULTIVITAMIN) TABS tablet Take 1 tablet daily at 12 noon by mouth.    . metroNIDAZOLE (METROGEL VAGINAL) 0.75 % vaginal gel Place 1 Applicatorful vaginally 2 (two) times daily. (Patient not taking: Reported on 09/26/2017) 70 g 0  . promethazine (PHENERGAN) 25 MG tablet Take 1 tablet (25 mg total) by mouth every 6 (six) hours as needed for nausea or vomiting. (Patient not taking: Reported on 09/26/2017) 30 tablet 0  . simethicone (GAS-X) 80 MG chewable tablet Chew 1 tablet (80 mg total) every 6 (six) hours as needed by mouth for flatulence. (Patient not taking: Reported on 10/14/2017) 30 tablet 0   No Known Allergies  ROS:  Review of Systems  Constitutional: Negative for chills and fever.  Respiratory: Negative for cough, shortness of breath and wheezing.   Gastrointestinal: Positive for abdominal pain. Negative for constipation, diarrhea, nausea and vomiting.  Genitourinary: Negative for frequency, urgency, vaginal bleeding and vaginal discharge.  Musculoskeletal: Negative for back pain.  All other systems reviewed and are negative.   I have reviewed patient's Past Medical Hx, Surgical Hx, Family Hx, Social Hx, medications and allergies.   Physical Exam   Patient Vitals for the past 24 hrs:  BP Temp Temp  src Pulse Resp SpO2 Weight  10/14/17 1454 111/69 98.4 F (36.9 C) Oral 92 16 100 % 105 lb 12 oz (48 kg)   Constitutional: Well-developed, well-nourished female in no acute distress.  Cardiovascular: normal rate Respiratory: normal effort GI: Abd soft, non-tender. Pos BS x 4 MS: Extremities nontender, no edema, normal ROM Neurologic: Alert and oriented x 4.  GU: Neg CVAT.  Bimanual exam: Cervix 0 internally, 1cm dilated externally/long/high, firm, anterior, neg CMT, uterus nontender.  FHT 145 by doppler  LAB RESULTS Results for orders placed or performed  during the hospital encounter of 10/14/17 (from the past 24 hour(s))  Urinalysis, Routine w reflex microscopic     Status: Abnormal   Collection Time: 10/14/17  2:58 PM  Result Value Ref Range   Color, Urine YELLOW YELLOW   APPearance CLOUDY (A) CLEAR   Specific Gravity, Urine 1.026 1.005 - 1.030   pH 5.0 5.0 - 8.0   Glucose, UA NEGATIVE NEGATIVE mg/dL   Hgb urine dipstick NEGATIVE NEGATIVE   Bilirubin Urine NEGATIVE NEGATIVE   Ketones, ur NEGATIVE NEGATIVE mg/dL   Protein, ur 30 (A) NEGATIVE mg/dL   Nitrite NEGATIVE NEGATIVE   Leukocytes, UA MODERATE (A) NEGATIVE   RBC / HPF 0-5 0 - 5 RBC/hpf   WBC, UA 6-30 0 - 5 WBC/hpf   Bacteria, UA RARE (A) NONE SEEN   Squamous Epithelial / LPF 6-30 (A) NONE SEEN   Mucus PRESENT    Ca Oxalate Crys, UA PRESENT     --/--/O POS (09/06 1523)  MAU Management/MDM: Ordered labs and reviewed results.  Urinalysis, OB Urine culture sent  Suspect round ligament pain. Discussed body mechanics and movement at work. Will get maternity support band at later date.    ASSESSMENT 1. Pain of round ligament during pregnancy   2. Abdominal pain during pregnancy in second trimester     PLAN Discharge home PTL precautions  Follow up in clinic  Pt stable at time of discharge    Steward DroneVeronica Zahirah Cheslock  Certified Nurse-Midwife 10/14/2017  3:41 PM

## 2017-10-15 LAB — CULTURE, OB URINE: Culture: NO GROWTH

## 2017-11-03 ENCOUNTER — Encounter (HOSPITAL_COMMUNITY): Payer: Self-pay

## 2017-11-03 ENCOUNTER — Other Ambulatory Visit: Payer: Self-pay

## 2017-11-03 ENCOUNTER — Inpatient Hospital Stay (HOSPITAL_COMMUNITY)
Admission: AD | Admit: 2017-11-03 | Discharge: 2017-11-03 | DRG: 779 | Disposition: A | Discharge status: Home / Self Care | Payer: 59 | Source: Ambulatory Visit | Attending: Obstetrics and Gynecology | Admitting: Obstetrics and Gynecology

## 2017-11-03 ENCOUNTER — Encounter: Payer: Self-pay | Admitting: Obstetrics and Gynecology

## 2017-11-03 ENCOUNTER — Inpatient Hospital Stay (HOSPITAL_COMMUNITY): Payer: 59

## 2017-11-03 DIAGNOSIS — O209 Hemorrhage in early pregnancy, unspecified: Secondary | ICD-10-CM

## 2017-11-03 DIAGNOSIS — Z3A19 19 weeks gestation of pregnancy: Secondary | ICD-10-CM

## 2017-11-03 DIAGNOSIS — O328XX0 Maternal care for other malpresentation of fetus, not applicable or unspecified: Secondary | ICD-10-CM | POA: Diagnosis present

## 2017-11-03 DIAGNOSIS — Z87891 Personal history of nicotine dependence: Secondary | ICD-10-CM

## 2017-11-03 DIAGNOSIS — O4290 Premature rupture of membranes, unspecified as to length of time between rupture and onset of labor, unspecified weeks of gestation: Secondary | ICD-10-CM

## 2017-11-03 DIAGNOSIS — O039 Complete or unspecified spontaneous abortion without complication: Secondary | ICD-10-CM

## 2017-11-03 DIAGNOSIS — O42012 Preterm premature rupture of membranes, onset of labor within 24 hours of rupture, second trimester: Secondary | ICD-10-CM | POA: Diagnosis not present

## 2017-11-03 DIAGNOSIS — O42912 Preterm premature rupture of membranes, unspecified as to length of time between rupture and onset of labor, second trimester: Secondary | ICD-10-CM | POA: Diagnosis present

## 2017-11-03 DIAGNOSIS — O021 Missed abortion: Principal | ICD-10-CM | POA: Diagnosis present

## 2017-11-03 HISTORY — DX: Premature rupture of membranes, unspecified as to length of time between rupture and onset of labor, unspecified weeks of gestation: O42.90

## 2017-11-03 LAB — CBC WITH DIFFERENTIAL/PLATELET
BASOS ABS: 0 10*3/uL (ref 0.0–0.1)
Basophils Relative: 0 %
EOS PCT: 2 %
Eosinophils Absolute: 0.1 10*3/uL (ref 0.0–0.7)
HCT: 37 % (ref 36.0–46.0)
Hemoglobin: 12.5 g/dL (ref 12.0–15.0)
LYMPHS ABS: 1.9 10*3/uL (ref 0.7–4.0)
Lymphocytes Relative: 22 %
MCH: 30.6 pg (ref 26.0–34.0)
MCHC: 33.8 g/dL (ref 30.0–36.0)
MCV: 90.5 fL (ref 78.0–100.0)
MONO ABS: 0.5 10*3/uL (ref 0.1–1.0)
MONOS PCT: 5 %
NEUTROS ABS: 6.2 10*3/uL (ref 1.7–7.7)
NEUTROS PCT: 71 %
PLATELETS: 163 10*3/uL (ref 150–400)
RBC: 4.09 MIL/uL (ref 3.87–5.11)
RDW: 14.3 % (ref 11.5–15.5)
WBC: 8.7 10*3/uL (ref 4.0–10.5)

## 2017-11-03 LAB — TYPE AND SCREEN
ABO/RH(D): O POS
Antibody Screen: NEGATIVE

## 2017-11-03 LAB — POCT FERN TEST: POCT Fern Test: POSITIVE

## 2017-11-03 MED ORDER — DIBUCAINE 1 % RE OINT: 1.0000 "application " | TOPICAL_OINTMENT | RECTAL | Status: DC | PRN

## 2017-11-03 MED ORDER — MISOPROSTOL 200 MCG PO TABS
400.0000 ug | ORAL_TABLET | ORAL | Status: DC
  Administered 2017-11-03: 400 ug via VAGINAL
  Filled 2017-11-03: qty 2

## 2017-11-03 MED ORDER — CALCIUM CARBONATE ANTACID 500 MG PO CHEW: 2.0000 | CHEWABLE_TABLET | ORAL | Status: DC | PRN

## 2017-11-03 MED ORDER — SIMETHICONE 80 MG PO CHEW: 80.0000 mg | CHEWABLE_TABLET | ORAL | Status: DC | PRN

## 2017-11-03 MED ORDER — COCONUT OIL OIL: 1.0000 "application " | TOPICAL_OIL | Status: DC | PRN

## 2017-11-03 MED ORDER — TETANUS-DIPHTH-ACELL PERTUSSIS 5-2.5-18.5 LF-MCG/0.5 IM SUSP: 0.5000 mL | Freq: Once | INTRAMUSCULAR | Status: DC

## 2017-11-03 MED ORDER — ZOLPIDEM TARTRATE 5 MG PO TABS: 5.0000 mg | ORAL_TABLET | Freq: Every evening | ORAL | Status: DC | PRN

## 2017-11-03 MED ORDER — ONDANSETRON HCL 4 MG/2ML IJ SOLN: 4.0000 mg | INTRAMUSCULAR | Status: DC | PRN

## 2017-11-03 MED ORDER — DOCUSATE SODIUM 100 MG PO CAPS
100.0000 mg | ORAL_CAPSULE | Freq: Every day | ORAL | Status: DC
  Filled 2017-11-03: qty 1

## 2017-11-03 MED ORDER — PRENATAL MULTIVITAMIN CH
1.0000 | ORAL_TABLET | Freq: Every day | ORAL | Status: DC
  Filled 2017-11-03: qty 1

## 2017-11-03 MED ORDER — BENZOCAINE-MENTHOL 20-0.5 % EX AERO: 1.0000 "application " | INHALATION_SPRAY | CUTANEOUS | Status: DC | PRN

## 2017-11-03 MED ORDER — IBUPROFEN 600 MG PO TABS: 600.0000 mg | ORAL_TABLET | Freq: Four times a day (QID) | ORAL | 1 refills | Status: DC | PRN

## 2017-11-03 MED ORDER — IBUPROFEN 600 MG PO TABS
600.0000 mg | ORAL_TABLET | Freq: Four times a day (QID) | ORAL | Status: DC
  Administered 2017-11-03: 600 mg via ORAL
  Filled 2017-11-03: qty 1

## 2017-11-03 MED ORDER — ONDANSETRON HCL 4 MG PO TABS: 4.0000 mg | ORAL_TABLET | ORAL | Status: DC | PRN

## 2017-11-03 MED ORDER — PRENATAL MULTIVITAMIN CH
1.0000 | ORAL_TABLET | Freq: Every day | ORAL | Status: DC
Start: 2017-11-04 — End: 2017-11-04

## 2017-11-03 MED ORDER — ACETAMINOPHEN 325 MG PO TABS: 650.0000 mg | ORAL_TABLET | ORAL | Status: DC | PRN

## 2017-11-03 MED ORDER — FENTANYL CITRATE (PF) 100 MCG/2ML IJ SOLN: 100.0000 ug | INTRAMUSCULAR | Status: DC | PRN

## 2017-11-03 MED ORDER — DIPHENHYDRAMINE HCL 25 MG PO CAPS: 25.0000 mg | ORAL_CAPSULE | Freq: Four times a day (QID) | ORAL | Status: DC | PRN

## 2017-11-03 MED ORDER — LACTATED RINGERS IV BOLUS (SEPSIS)
1000.0000 mL | Freq: Once | INTRAVENOUS | Status: AC
  Administered 2017-11-03: 1000 mL via INTRAVENOUS

## 2017-11-03 MED ORDER — SENNOSIDES-DOCUSATE SODIUM 8.6-50 MG PO TABS: 2.0000 | ORAL_TABLET | ORAL | Status: DC

## 2017-11-03 MED ORDER — OXYTOCIN 40 UNITS IN LACTATED RINGERS INFUSION - SIMPLE MED
INTRAVENOUS | Status: AC
  Administered 2017-11-03: 18:00:00
  Filled 2017-11-03: qty 1000

## 2017-11-03 MED ORDER — WITCH HAZEL-GLYCERIN EX PADS: 1.0000 "application " | MEDICATED_PAD | CUTANEOUS | Status: DC | PRN

## 2017-11-03 NOTE — Progress Notes (Signed)
Brianne, RN [employer] Super[employer] notified of delivery.

## 2017-11-03 NOTE — Discharge Summary (Signed)
OB Discharge Summary     Patient Name: Carrie Hampton DOB: [birth year] MRN: [id number]  Date of admission: 11/03/2017 Delivering MD: Tilda BurrowFERGUSON, Tiron Suski V   Date of discharge: 11/03/2017  Admitting diagnosis: 19 WKS DISCHARGE, previable preterm rupture of membranes Intrauterine pregnancy: 9230w0d, delivered, miscarriage     Secondary diagnosis:  Active Problems:   Miscarriage   Premature rupture of membranes  Additional problems: possible cervical incompetence     Discharge diagnosis: Preterm Pregnancy Delivered and miscarriage                                                                                                Post partum procedures:none  Augmentation: Cytotec  [facility]  Hospital course:  Patient was admitted with spontaneous rupture of membranes confirmed in the MAU by ultrasound and exam which showed clear fluid per vagina, and ultrasound showed anhydramnios, breech presentation with fetal cord just inside the cervix.  She was counseled over management options and requested, and then after counseling with neonatology and obstetrics gynecology attending she decided to proceed with induction of labor.  She was examined and found to have.  Cytotec was used x1 treatment 400 mcg per vagina as she progressed within 4 hours to spontaneous expulsion of the fetus from a breech presentation and spontaneous expulsion of the placenta.  There was no malodor.  Cervix is dilated to approximately 3-1/2 cm. the rapidity of labor in the minimal contractions felt raises question of cervical incompetence.  Physical exam  Vitals:   11/03/17 1207 11/03/17 1559 11/03/17 1628 11/03/17 1931  BP: 117/74 101/75  (!) 108/93  Pulse: 99 99  90  Resp: 18 18  17   Temp: 98.6 F (37 C) 98.3 F (36.8 C)  99.1 F (37.3 C)  TempSrc: Oral Oral  Oral  SpO2: 100% 100%  100%  Weight:   105 lb 12 oz (48 kg)   Height:   5\' 3"  (1.6 m)    General: alert, cooperative and no distress Lochia:  appropriate Uterine Fundus: firm difficult to feel beneath the symphysis pubis Incision:  DVT Evaluation: No evidence of DVT seen on physical exam. Labs: Lab Results  Component Value Date   WBC 8.7 11/03/2017   HGB 12.5 11/03/2017   HCT 37.0 11/03/2017   MCV 90.5 11/03/2017   PLT 163 11/03/2017   CMP Latest Ref Rng & Units 08/02/2017  Glucose 65 - 99 mg/dL 74  BUN 6 - 20 mg/dL 6  Creatinine 4.090.44 - 8.111.00 mg/dL 9.140.66  Sodium 782135 - 956145 mmol/L 136  Potassium 3.5 - 5.1 mmol/L 3.2(L)  Chloride 101 - 111 mmol/L 104  CO2 22 - 32 mmol/L 23  Calcium 8.9 - 10.3 mg/dL 9.1  Total Protein 6.5 - 8.1 g/dL 6.7  Total Bilirubin 0.3 - 1.2 mg/dL 0.9  Alkaline Phos 38 - 126 U/L 54  AST 15 - 41 U/L 19  ALT 14 - 54 U/L 10(L)    Discharge instruction: per After Visit Summary and "Baby and Me Booklet".  After visit meds:  Allergies as of 11/03/2017   No Known Allergies  Medication List    STOP taking these medications   metroNIDAZOLE 0.75 % vaginal gel Commonly known as:  METROGEL VAGINAL   promethazine 25 MG tablet Commonly known as:  PHENERGAN     TAKE these medications   ibuprofen 600 MG tablet Commonly known as:  ADVIL,MOTRIN Take 1 tablet (600 mg total) by mouth every 6 (six) hours as needed.   simethicone 80 MG chewable tablet Commonly known as:  GAS-X Chew 1 tablet (80 mg total) every 6 (six) hours as needed by mouth for flatulence.       Diet: routine diet  Activity: Advance as tolerated. Pelvic rest for 6 weeks.   Outpatient follow up:2 weeks Follow up Appt:No future appointments. Follow up Visit:No Follow-up on file.  Postpartum contraception: Undecided Patient encouraged to decide on contraception, discussed with the health department and to delay any future efforts at pregnancy until she has discussed the possibility of cerclage with an OB/GYN.  Early pregnancy care encouraged with future pregnancies. Newborn Data: Still born female  Birth Weight:   APGAR: , 0,  0  Newborn Delivery   Birth date/time:  11/03/2017 18:36:00 Delivery type:  Vaginal, Spontaneous breech single footling     Baby Feeding: Not applicable Disposition:morgue  at discharge the patient is considering funeral   11/03/2017 Tilda BurrowJohn V Maurine Mowbray, MD

## 2017-11-03 NOTE — MAU Note (Signed)
?  water broke at 6 am. Clear with blood tinge. No pain.

## 2017-11-03 NOTE — Progress Notes (Signed)
Carrie Hampton is a 26 y.o. G5P0040 at 324w0d by ultrasound admitted for induction of labor due to previable, PROM  Subjective: Patient has come around to the idea of accepting IOL after discussion with Dr Katrinka BlazingSmith of Neonatology who confirms the nonviability of this fetus, and absence of effective interventions in presence of cord prolapse, previable PPROM, and breech presentation. Will begin Cytotec IOL.  Objective: BP 117/74 (BP Location: Left Arm)   Pulse 99   Temp 98.6 F (37 C) (Oral)   Resp 18   LMP 05/17/2017 Comment: Pt. states she has irregular periods  SpO2 100%  No intake/output data recorded. No intake/output data recorded.  FHT:  120 by u/s UC:   none SVE:    cord prolapse by last exam, visible at introitus.  U/s by me confirms anhydramnios and + fht  Labs: Lab Results  Component Value Date   WBC 8.7 11/03/2017   HGB 12.5 11/03/2017   HCT 37.0 11/03/2017   MCV 90.5 11/03/2017   PLT 163 11/03/2017    Assessment / Plan: pprom, previable.  Labor: induction to begin , to use cytotec 400 pv  q4h Preeclampsia:   Fetal Wellbeing:  previable Pain Control:  IV pain meds I/D:  n/a Anticipated MOD:  breech del at 3rd floor  Tilda BurrowJohn V Amon Costilla 11/03/2017, 2:54 PM

## 2017-11-03 NOTE — MAU Provider Note (Signed)
History     CSN: [id number]  Arrival date and time: 11/03/17 [id number]   First Provider Initiated Contact with Patient 11/03/17 [phone]      Chief Complaint  Patient presents with  . Vaginal Bleeding  . Vaginal Discharge   HPI Ms. Seward SpeckShauna S Hoefer is a 26 y.o. [id number] at 7270w0d who presents to MAU today with complaint of LOF. The patient states that she woke up in a large amount of fluid today. She denies pain. She was not bleeding prior to arrival but is bleeding fairly heavily now. [facility]   OB History    Gravida Para Term Preterm AB Living   5       4 0   SAB TAB Ectopic Multiple Live Births   2 2            Past Medical History:  Diagnosis Date  . Hypertension   . No pertinent past medical history   . STD (female) 2013   Chlamydia    Past Surgical History:  Procedure Laterality Date  . TOOTH EXTRACTION      Family History  Problem Relation Age of Onset  . Alcohol abuse Neg Hx   . Arthritis Neg Hx   . Asthma Neg Hx   . Birth defects Neg Hx   . Cancer Neg Hx   . COPD Neg Hx   . Depression Neg Hx   . Diabetes Neg Hx   . Drug abuse Neg Hx   . Early death Neg Hx   . Hearing loss Neg Hx   . Heart disease Neg Hx   . Hyperlipidemia Neg Hx   . Hypertension Neg Hx   . Kidney disease Neg Hx   . Learning disabilities Neg Hx   . Mental illness Neg Hx   . Mental retardation Neg Hx   . Miscarriages / Stillbirths Neg Hx   . Stroke Neg Hx   . Vision loss Neg Hx     Social History   Tobacco Use  . Smoking status: Former Smoker    Packs/day: 1.00    Types: Cigars    Last attempt to quit: 10/20/2012    Years since quitting: 5.0  . Smokeless tobacco: Never Used  Substance Use Topics  . Alcohol use: No    Comment: socially  . Drug use: No    Allergies: No Known Allergies  Medications Prior to Admission  Medication Sig Dispense Refill Last Dose  . metroNIDAZOLE (FLAGYL) 250 MG tablet Take 250 mg 2 (two) times daily by mouth.   More than  a month at Unknown time  . metroNIDAZOLE (METROGEL VAGINAL) 0.75 % vaginal gel Place 1 Applicatorful vaginally 2 (two) times daily. (Patient not taking: Reported on 09/26/2017) 70 g 0 Not Taking at Unknown time  . Prenatal Vit-Fe Fumarate-FA (PRENATAL MULTIVITAMIN) TABS tablet Take 1 tablet daily at 12 noon by mouth.   More than a month at Unknown time  . promethazine (PHENERGAN) 25 MG tablet Take 1 tablet (25 mg total) by mouth every 6 (six) hours as needed for nausea or vomiting. (Patient not taking: Reported on 09/26/2017) 30 tablet 0 Not Taking at Unknown time  . simethicone (GAS-X) 80 MG chewable tablet Chew 1 tablet (80 mg total) every 6 (six) hours as needed by mouth for flatulence. (Patient not taking: Reported on 10/14/2017) 30 tablet 0 Not Taking at Unknown time    Review of Systems  Constitutional: Negative for fever.  Gastrointestinal:  Negative for abdominal pain.  Genitourinary: Positive for vaginal bleeding and vaginal discharge.   Physical Exam   Blood pressure 110/74, pulse 83, temperature 98.1 F (36.7 C), temperature source Oral, resp. rate 18, last menstrual period 05/17/2017, SpO2 100 %, unknown if currently breastfeeding.  Physical Exam  Nursing note and vitals reviewed. Constitutional: She is oriented to person, place, and time. She appears well-developed and well-nourished. No distress.  HENT:  Head: Normocephalic and atraumatic.  Cardiovascular: Normal rate.  Respiratory: Effort normal.  GI: Soft. She exhibits no distension and no mass. There is no tenderness. There is no rebound and no guarding.  Genitourinary: Uterus is enlarged (appropriate for GA). Uterus is not tender. Cervix exhibits no discharge and no friability. There is bleeding (small) in the vagina. Vaginal discharge (small mucous) found.  Genitourinary Comments: Cervix: visually dilated with protruding membranes  Neurological: She is alert and oriented to person, place, and time.  Skin: Skin is warm  and dry. No erythema.  Psychiatric: She has a normal mood and affect.     Results for orders placed or performed during the hospital encounter of 11/03/17 (from the past 24 hour(s))  Fern Test     Status: None   Collection Time: 11/03/17  7:07 AM  Result Value Ref Range   POCT Fern Test Positive = ruptured amniotic membanes   CBC with Differential/Platelet     Status: None   Collection Time: 11/03/17  7:22 AM  Result Value Ref Range   WBC 8.7 4.0 - 10.5 K/uL   RBC 4.09 3.87 - 5.11 MIL/uL   Hemoglobin 12.5 12.0 - 15.0 g/dL   HCT 16.137.0 09.636.0 - 04.546.0 %   MCV 90.5 78.0 - 100.0 fL   MCH 30.6 26.0 - 34.0 pg   MCHC 33.8 30.0 - 36.0 g/dL   RDW 40.914.3 81.111.5 - 91.415.5 %   Platelets 163 150 - 400 K/uL   Neutrophils Relative % 71 %   Neutro Abs 6.2 1.7 - 7.7 K/uL   Lymphocytes Relative 22 %   Lymphs Abs 1.9 0.7 - 4.0 K/uL   Monocytes Relative 5 %   Monocytes Absolute 0.5 0.1 - 1.0 K/uL   Eosinophils Relative 2 %   Eosinophils Absolute 0.1 0.0 - 0.7 K/uL   Basophils Relative 0 %   Basophils Absolute 0.0 0.0 - 0.1 K/uL    MAU Course  Procedures None  MDM FHR - 138 bpm with doppler Fern - positive CBC, type & screen Discussed with Dr. Debroah LoopArnold. Would like US to determine fluid levels.  0800 - Waiting for US results. Care turned over to [facility] Inc[facility]athryn Lubertha Leite, CNM  Vonzella NippleJulie Wenzel, PA-C 11/03/2017, 8:10 AM  Assessment and Plan    0915 am; Dr. Emelda FearFerguson at the bedside to assess patient and discuss options. At 1050 patient has decided that she wants to be admitted for observation and to wait and see if she goes into labor on her own; otherwise will consider starting labor after 24 to 48 hours.   Plan: Admit to antepartum  Luna KitchensKathryn Taylynn Easton

## 2017-11-03 NOTE — H&P (Signed)
ANTEPARTUM ADMISSION HISTORY AND PHYSICAL NOTE   History of Present Illness: Carrie Hampton is a 26 y.o. G5P0040 at 3334w0d admitted for PROM at 19 weeks. Patient states that she woke up and went to the use the bathroom this morning and her water broke.  Patient reports the fetal movement as none. . Patient reports uterine contraction  activity as none. Patient reports  vaginal bleeding as fairly heavy. . Patient describes fluid per vagina as Clear. Fetal presentation is breech.  Patient Active Problem List   Diagnosis Date Noted  . Miscarriage 11/03/2017  . Premature rupture of membranes 11/03/2017    Past Medical History:  Diagnosis Date  . Hypertension   . No pertinent past medical history   . STD (female) 2013   Chlamydia    Past Surgical History:  Procedure Laterality Date  . TOOTH EXTRACTION      OB History  Gravida Para Term Preterm AB Living  5       4 0  SAB TAB Ectopic Multiple Live Births  2 2          # Outcome Date GA Lbr Len/2nd Weight Sex Delivery Anes PTL Lv  5 Current           4 SAB 2014          3 SAB 2013             Birth Comments: System Generated. Please review and update pregnancy details.  2 TAB           1 TAB               Social History   Socioeconomic History  . Marital status: Single    Spouse name: None  . Number of children: None  . Years of education: None  . Highest education level: None  Social Needs  . Financial resource strain: None  . Food insecurity - worry: None  . Food insecurity - inability: None  . Transportation needs - medical: None  . Transportation needs - non-medical: None  Occupational History  . None  Tobacco Use  . Smoking status: Former Smoker    Packs/day: 1.00    Types: Cigars    Last attempt to quit: 10/20/2012    Years since quitting: 5.0  . Smokeless tobacco: Never Used  Substance and Sexual Activity  . Alcohol use: No    Comment: socially  . Drug use: No  . Sexual activity: Yes    Birth  control/protection: None  Other Topics Concern  . None  Social History Narrative  . None    Family History  Problem Relation Age of Onset  . Alcohol abuse Neg Hx   . Arthritis Neg Hx   . Asthma Neg Hx   . Birth defects Neg Hx   . Cancer Neg Hx   . COPD Neg Hx   . Depression Neg Hx   . Diabetes Neg Hx   . Drug abuse Neg Hx   . Early death Neg Hx   . Hearing loss Neg Hx   . Heart disease Neg Hx   . Hyperlipidemia Neg Hx   . Hypertension Neg Hx   . Kidney disease Neg Hx   . Learning disabilities Neg Hx   . Mental illness Neg Hx   . Mental retardation Neg Hx   . Miscarriages / Stillbirths Neg Hx   . Stroke Neg Hx   . Vision loss Neg Hx     No  Known Allergies  Medications Prior to Admission  Medication Sig Dispense Refill Last Dose  . metroNIDAZOLE (METROGEL VAGINAL) 0.75 % vaginal gel Place 1 Applicatorful vaginally 2 (two) times daily. (Patient not taking: Reported on 09/26/2017) 70 g 0 Not Taking at Unknown time  . promethazine (PHENERGAN) 25 MG tablet Take 1 tablet (25 mg total) by mouth every 6 (six) hours as needed for nausea or vomiting. (Patient not taking: Reported on 09/26/2017) 30 tablet 0 Not Taking at Unknown time  . simethicone (GAS-X) 80 MG chewable tablet Chew 1 tablet (80 mg total) every 6 (six) hours as needed by mouth for flatulence. (Patient not taking: Reported on 10/14/2017) 30 tablet 0 Not Taking at Unknown time    Review of Systems - Normal  Vitals:  BP 110/74 (BP Location: Left Arm)   Pulse 83   Temp 98.1 F (36.7 C) (Oral)   Resp 18   LMP 05/17/2017 Comment: Pt. states she has irregular periods  SpO2 100%  Physical Examination: CONSTITUTIONAL: Well-developed, well-nourished female in no acute distress.  HENT:  Normocephalic, atraumatic, External right and left ear normal. Oropharynx is clear and moist EYES: Conjunctivae and EOM are normal. Pupils are equal, round, and reactive to light. No scleral icterus.  NECK: Normal range of motion,  supple, no masses SKIN: Skin is warm and dry. No rash noted. Not diaphoretic. No erythema. No pallor. NEUROLGIC: Alert and oriented to person, place, and time. Normal reflexes, muscle tone coordination. No cranial nerve deficit noted. PSYCHIATRIC: Normal mood and affect. Normal behavior. Normal judgment and thought content. CARDIOVASCULAR: Normal heart rate noted, regular rhythm RESPIRATORY: Effort and breath sounds normal, no problems with respiration noted ABDOMEN: Soft, nontender, nondistended, gravid. MUSCULOSKELETAL: Normal range of motion. No edema and no tenderness. 2+ distal pulses.  Cervix: Evaluated by sterile speculum exam and found to be visually dilated with membranes in the vagina.  Membranes:intact, ruptured Fetal Monitoring:Present on US by Dr. Emelda FearFerguson at the bedside.  Tocometer: Flat  Labs:  Results for orders placed or performed during the hospital encounter of 11/03/17 (from the past 24 hour(s))  Fern Test   Collection Time: 11/03/17  7:07 AM  Result Value Ref Range   POCT Fern Test Positive = ruptured amniotic membanes   CBC with Differential/Platelet   Collection Time: 11/03/17  7:22 AM  Result Value Ref Range   WBC 8.7 4.0 - 10.5 K/uL   RBC 4.09 3.87 - 5.11 MIL/uL   Hemoglobin 12.5 12.0 - 15.0 g/dL   HCT 16.137.0 09.636.0 - 04.546.0 %   MCV 90.5 78.0 - 100.0 fL   MCH 30.6 26.0 - 34.0 pg   MCHC 33.8 30.0 - 36.0 g/dL   RDW 40.914.3 81.111.5 - 91.415.5 %   Platelets 163 150 - 400 K/uL   Neutrophils Relative % 71 %   Neutro Abs 6.2 1.7 - 7.7 K/uL   Lymphocytes Relative 22 %   Lymphs Abs 1.9 0.7 - 4.0 K/uL   Monocytes Relative 5 %   Monocytes Absolute 0.5 0.1 - 1.0 K/uL   Eosinophils Relative 2 %   Eosinophils Absolute 0.1 0.0 - 0.7 K/uL   Basophils Relative 0 %   Basophils Absolute 0.0 0.0 - 0.1 K/uL  Type and screen [facility]   Collection Time: 11/03/17  7:22 AM  Result Value Ref Range   ABO/RH(D) O POS    Antibody Screen NEG    Sample Expiration  11/06/2017     Imaging Studies: No results found.  Assessment and Plan: Patient Active Problem List   Diagnosis Date Noted  . Miscarriage 11/03/2017  . Premature rupture of membranes 11/03/2017   Admit to Antenatal -Temperature q 4 hours  -GBS swab -defer antibiotics for now -FHT q shift   Marylene Land, CNM

## 2017-11-03 NOTE — Discharge Instructions (Signed)
Those of us who cared for you are very sorry for your loss.  Please consider reaching out to the [facility] group through [facility] if you would like to speak or meet with others who have experienced losses similar to yours.  The.  The heart strings pregnancy and infant loss support group [place] is available.  Their phone number is [phone]

## 2017-11-03 NOTE — Plan of Care (Signed)
Patient discharged home.

## 2017-11-03 NOTE — Progress Notes (Signed)
Placenta delivered

## 2017-11-03 NOTE — Progress Notes (Signed)
Dr. Emelda FearFerguson at beside.   New orders: IV Pit 40u at 125/hr Md to reassess in 30 minutes

## 2017-11-03 NOTE — Progress Notes (Signed)
1930: Received patient in bed, surrounded by several family members including her mother. Patient was holding her baby in a blanket looking fearful and sad. She denies pain or discomfort at this time. LR@50  mls/hr and [place]@125  mls/hr infusing through a Lt FA PIV. A courtesy tray was provided and her visitors were encouraged to participate.   2000: We offered pastoral service to patient, but she refused because she had her church pastor at the bedside.  2215: IV infusions stopped. PIV removed, all parts accounted for, no bleeding noted and gauze dressing applied. Pt tolerated procedure well.  2254: Patient D/C'd home and was accompanied by her mother. The discharge instruction, prescription and community resources were reviewed with patient and her mother. Both pt and her mother verbalized understanding. We provided a baby gift box, took foot prints and pictures of the baby, but mom refused the opportunity to be photograph with her baby. The baby's weight and measurements were taken and documented on the delivery flow chart.  Patient's mother has [facility] to take care of the body.#[phone]) [phone]. Patient held and kissed her baby one last time before handing the baby off to me. She turned sobbing as she ambulated off the unit. She was accompanied by the Nurse Tech.

## 2017-11-03 NOTE — Progress Notes (Signed)
Delivery Note At  a non-viable Female was delivered via  (Presentation: ;  footling breech).  APGAR:0\0 , ; weight  .  pending Placenta status: intact , spontaneous, .  Cord: 3 vc with the following complications: .  Cord pH:   Anesthesia: none  Episiotomy:   Lacerations:   Suture Repair:  Est. Blood Loss (mL):  100 Blood type O positive. Mom to [facility].  [facility] Family to decide on disposition of the body. Tilda BurrowJohn V Nadege Carriger 11/03/2017, 6:52 PM  Patient ID: Carrie SpeckShauna S Hampton, female   DOB: 1991-07-09, 26 y.o.   MRN: [id number]

## 2017-11-03 NOTE — Progress Notes (Signed)
In to patients room to insert second dose of Cytotec.  Foot visualized out of vagina.  Dr. Emelda FearFerguson notified and on his way to room.

## 2017-11-03 NOTE — MAU Note (Signed)
Pt arrived via EMS stretcher to RM #7. Ambulated to bed without difficulty. Awoke this am leaking cl fld. Saw small amt blood on panties but mostly clear fld.

## 2017-11-03 NOTE — Progress Notes (Signed)
Patient delivered 19-week fetus.

## 2017-11-03 NOTE — Progress Notes (Signed)
Call placed to Dr. Emelda FearFerguson. Dr. Emelda FearFerguson made aware of the delivery of a 19-week fetus.

## 2017-11-03 NOTE — Progress Notes (Signed)
Called to patient room.  Patient had been up to bathroom to void and she had partially expelled one of the Cytotec tablets.  Dr. Emelda FearFerguson notified and orders received.

## 2017-11-03 NOTE — Progress Notes (Signed)
Provider called for delivery

## 2017-11-03 NOTE — MAU Note (Signed)
Notified Harlon FlorJ. Wenzel PA patient PPROM positive fern slide.

## 2017-11-03 NOTE — Consult Note (Signed)
Neonatal Medicine 11/03/2017 4:49 PM  Seward SpeckShauna S Tomasetti [id number]   I was asked by Dr. Emelda FearFerguson to speak to this patient due to her presentation at 3119 0/7 weeks with PROM and now cord prolapse.  The patient was having difficulty accepting the baby's non-viable prognosis.   I reiterated to her and her mother that a fetus born at 1219 weeks would not survive, and this finding was true throughout the world.  The earliest we have could predict some small degree of survival would be for a 22-23 week fetus, which is far beyond the patient's current gestational age.    Ruben GottronMcCrae Nilay Mangrum, MD Neonatal Medicine

## 2017-11-03 NOTE — Progress Notes (Signed)
Dr. Ferguson at bedside. 

## 2017-11-03 NOTE — Progress Notes (Signed)
Inserted Cytotec 400 mcg vaginally.  Patient tolerated well.

## 2017-11-05 LAB — CULTURE, BETA STREP (GROUP B ONLY)

## 2018-05-27 IMAGING — US US MFM FETAL NUCHAL TRANSLUCENCY
1 series · 15 of 28 positions shown · non-contrast
Comparison: none

[Series 1: us mfm fetal nuchal translucency · 15 of 36 slices shown]
[im 1/36]
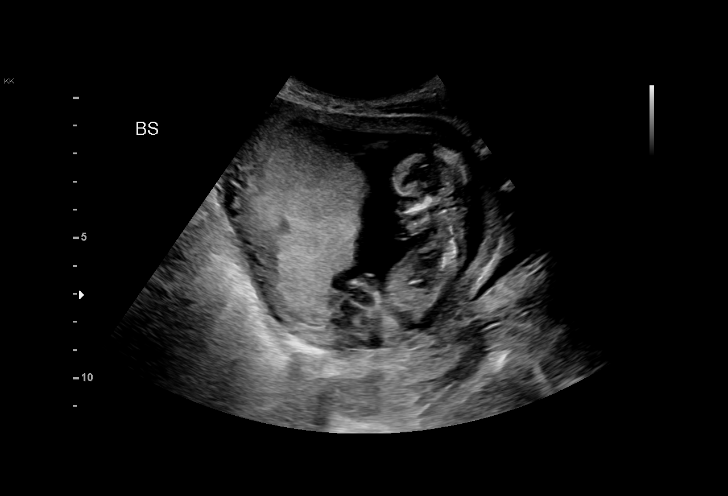
[im 3/36]
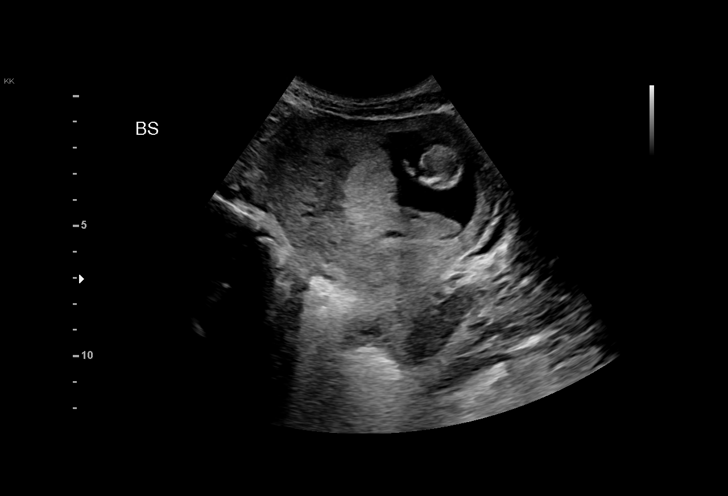
[im 6/36]
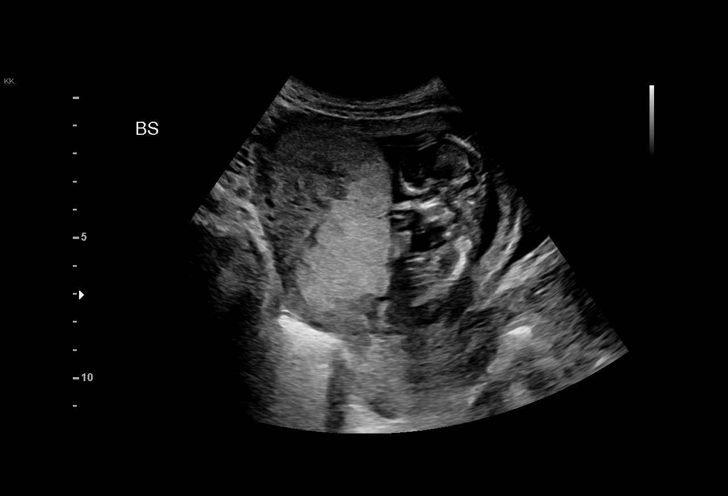
[im 8/36]
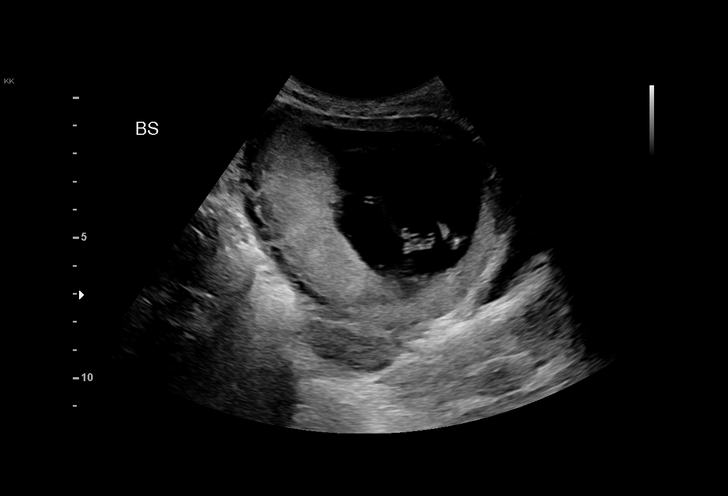
[im 11/36]
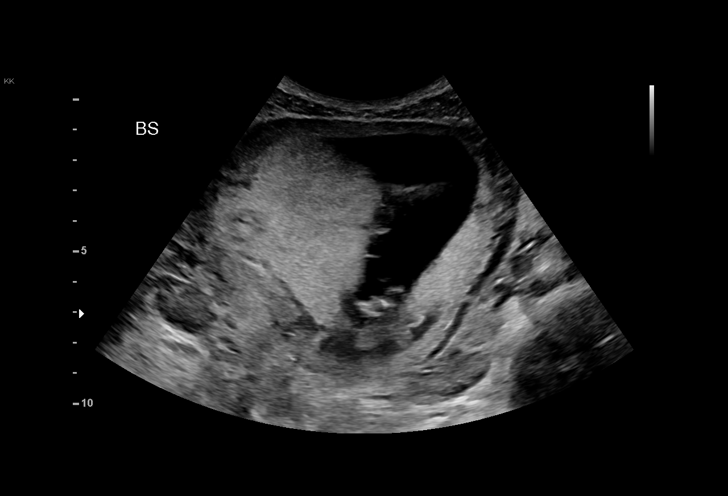
[im 13/36]
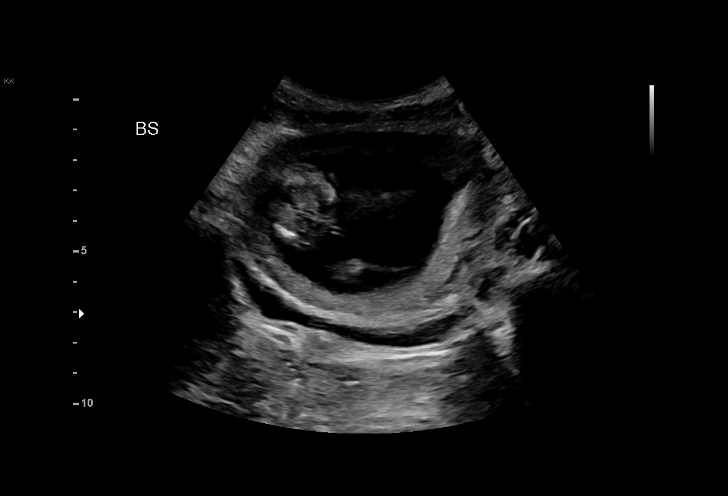
[im 16/36]
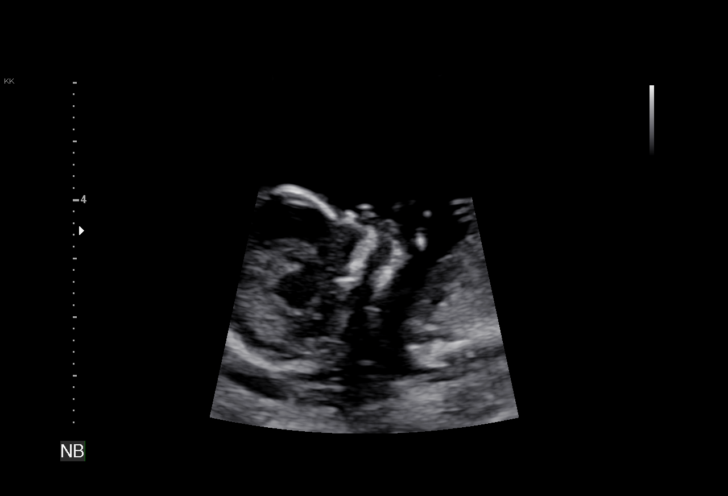
[im 19/36]
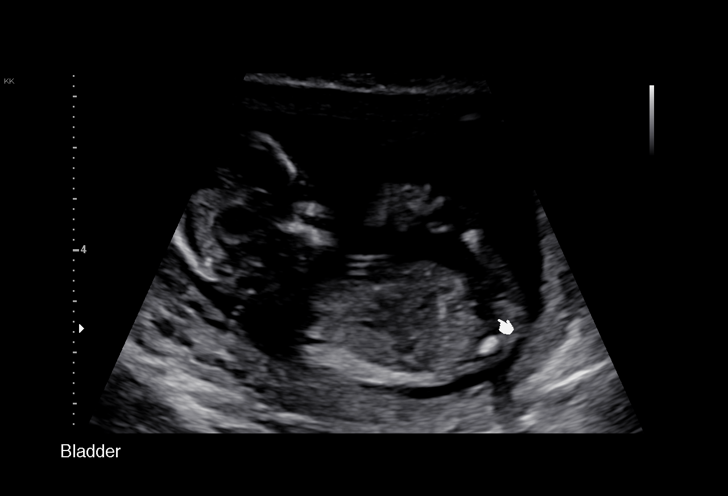
[im 20/36]
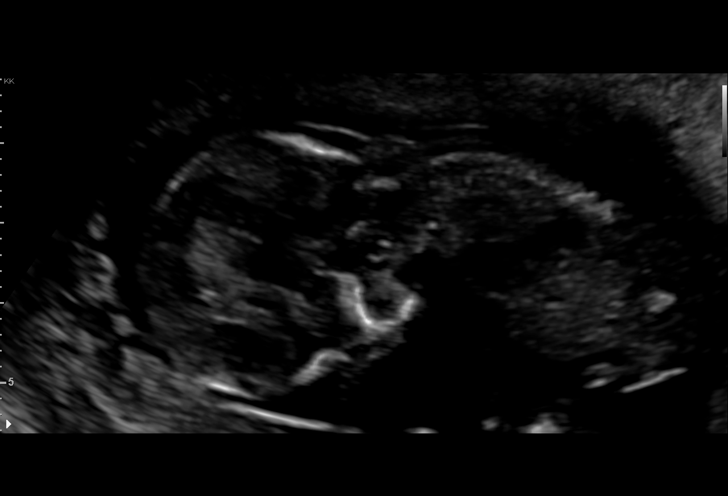
[im 23/36]
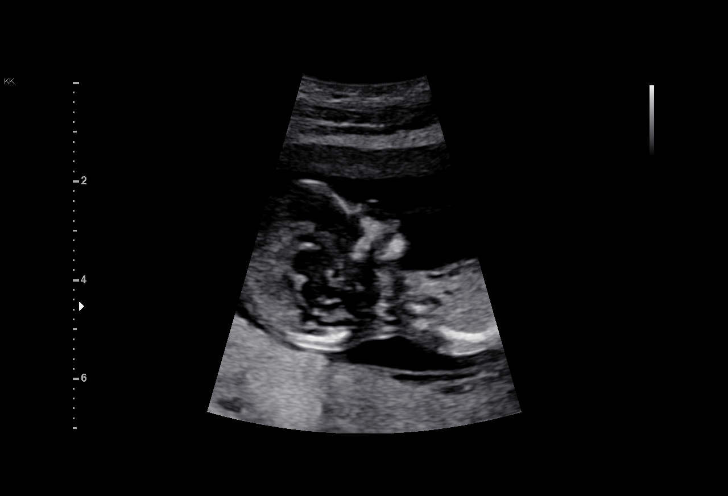
[im 25/36]
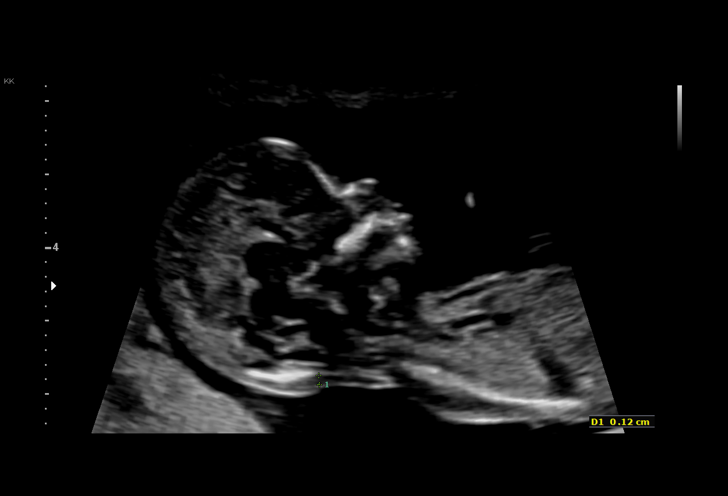
[im 28/36]
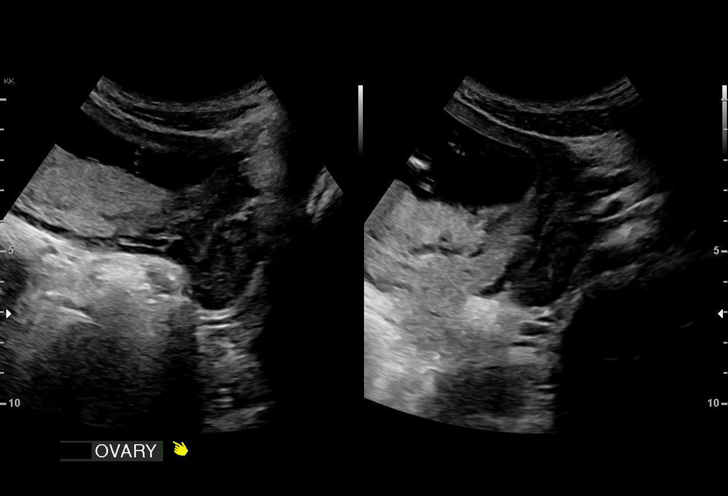
[im 30/36]
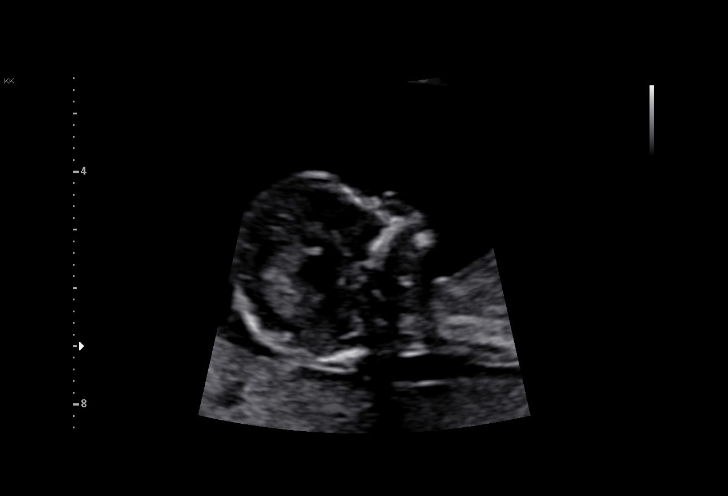
[im 33/36]
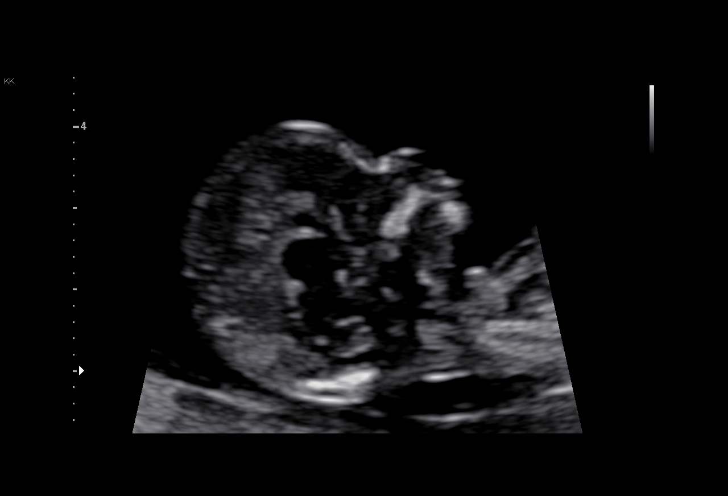
[im 36/36]
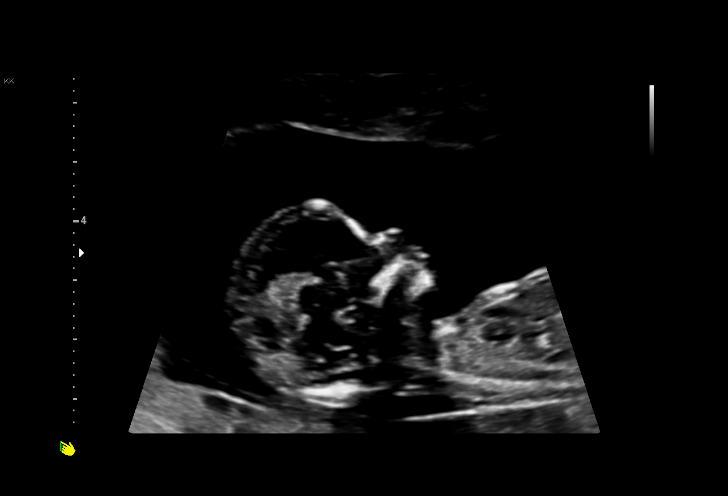

[15 of 28 positions shown; findings below may reference images not displayed]

[REDACTED]-
Faculty Physician

TRANSLUCENCY

1  BLONDINACKA SAMSONAITE              [id number]      [id number]     [id number]
Indications

13 weeks gestation of pregnancy
Encounter for nuchal translucency
OB History

Gravidity:    5         Term:   0        Prem:   0        SAB:   2
TOP:          2       Ectopic:  0        Living: 0
Fetal Evaluation

Num Of Fetuses:     1
Preg. Location:     Intrauterine
Gest. Sac:          Intrauterine
Fetal Pole:         Visualized
Fetal Heart         150
Rate(bpm):
Cardiac Activity:   Observed
Biometry

CRL:      72.3  mm     G. Age:  13w 1d                  EDD:   04/02/18
Gestational Age

LMP:           18w 6d        Date:  05/17/17                 EDD:   02/21/18
Best:          13w 4d     Det. By:  Early Ultrasound         EDD:   03/30/18
(08/02/17)
1st Trimester Genetic Sonogram Screening
CRL:            72.3  mm    G. Age:   13w 1d                 EDD:   04/02/18
Nuc Trans:       1.2  mm
Nasal Bone:                 Present
Cervix Uterus Adnexa

Cervix
Closed

Uterus
No abnormality visualized.

Left Ovary
Within normal limits.

Right Ovary
Within normal limits.

Cul De Sac:   No free fluid seen.

Adnexa:       No abnormality visualized.
Impression

Singe intrauterine pregnancy at 13w 4d.
NT 1.2mm.
Nasal bone present.
Limited anatomy appears normal for gestational age.

First trimester screen performed.

Recommendations

Follow-up for complete anatomic survey at 18 weeks.

## 2018-11-27 NOTE — L&D Delivery Note (Addendum)
OB/GYN Faculty Practice Delivery Note  Carrie Hampton is a 28 y.o. [id number] s/p Vaginal delivery at [redacted]w[redacted]d. She was admitted for SOL/SROM.   ROM: 42h 62m with 92mls fluid GBS Status: Positive Maximum Maternal Temperature: 98.5  Labor Progress: . Achieved complete dilation at 1303 and feeling some pressure. Augmentated with 37miliunits Pitocin until delivery.  Delivery Date/Time: 10/21/2019 at 1410 Delivery: Called to room and patient was complete and pushing. Head delivered LOA.Tight nuchal cord present x1 and easily reduced. Shoulder and body delivered in usual fashion. Infant with spontaneous cry, placed on mother's abdomen, dried and stimulated. Cord clamped x 2 after 1-minute delay, and cut by FOB. Cord blood drawn. Cord pH = 7.2. Placenta delivered spontaneously with gentle cord traction. Fundus firm with massage and Pitocin. Labia, perineum, vagina, and cervix inspected inspected with Right labial laceration with hemostasis without repair and left lower perineal first degree tear repaired with 2.0 vicryl, hemostasis achieved.   Placenta: 3 cord vessel, intact Complications: none Lacerations: Right labial laceration, hemostatic.  Left lower perineal first degree repaired with 2.0 Vicryl.  QBL: 59mls Analgesia: Epidural  Postpartum Planning [x]  message to sent to schedule follow-up  [x]  vaccines UTD  Infant: female  APGARs 4/9  Carrie Hampton for [place] 10/21/2019, 2:39 PM    I was gloved and present for the delivery in its entirety, and I agree with the above resident's note.    Laury Deep, CNM 10/21/2019 4:42 PM

## 2018-12-22 ENCOUNTER — Emergency Department (HOSPITAL_COMMUNITY)
Admission: EM | Admit: 2018-12-22 | Discharge: 2018-12-22 | Disposition: A | Discharge status: Home / Self Care | Payer: 59 | Attending: Emergency Medicine | Admitting: Emergency Medicine

## 2018-12-22 ENCOUNTER — Other Ambulatory Visit: Payer: Self-pay

## 2018-12-22 ENCOUNTER — Encounter (HOSPITAL_COMMUNITY): Payer: Self-pay

## 2018-12-22 DIAGNOSIS — Z87891 Personal history of nicotine dependence: Secondary | ICD-10-CM | POA: Insufficient documentation

## 2018-12-22 DIAGNOSIS — M436 Torticollis: Secondary | ICD-10-CM | POA: Insufficient documentation

## 2018-12-22 DIAGNOSIS — I10 Essential (primary) hypertension: Secondary | ICD-10-CM | POA: Insufficient documentation

## 2018-12-22 MED ORDER — CYCLOBENZAPRINE HCL 10 MG PO TABS
10.0000 mg | ORAL_TABLET | Freq: Once | ORAL | Status: AC
  Administered 2018-12-22: 10 mg via ORAL
  Filled 2018-12-22: qty 1

## 2018-12-22 MED ORDER — NAPROXEN 375 MG PO TABS: 375.0000 mg | ORAL_TABLET | Freq: Two times a day (BID) | ORAL | 0 refills | Status: DC

## 2018-12-22 MED ORDER — CYCLOBENZAPRINE HCL 5 MG PO TABS: 5.0000 mg | ORAL_TABLET | Freq: Three times a day (TID) | ORAL | 0 refills | Status: DC | PRN

## 2018-12-22 MED ORDER — IBUPROFEN 200 MG PO TABS
600.0000 mg | ORAL_TABLET | Freq: Once | ORAL | Status: AC
  Administered 2018-12-22: 600 mg via ORAL
  Filled 2018-12-22: qty 3

## 2018-12-22 NOTE — ED Triage Notes (Signed)
Pt reports right sided neck pain after doing some stretching this morning and feels like she pulled something.

## 2018-12-22 NOTE — ED Notes (Signed)
Bed: WTR5 Expected date:  Expected time:  Means of arrival:  Comments: 

## 2018-12-22 NOTE — ED Provider Notes (Signed)
[facility]-EMERGENCY DEPT Provider Note   CSN: [id number] Arrival date & time: 12/22/18  1139     History   Chief Complaint Chief Complaint  Patient presents with  . Neck Pain    HPI CYNNAMON KENTER is a 28 y.o. female who presents to the ED with neck pain. The pain is located on the right side of the neck and started after she was doing stretches. Patient thinks she may have pulled a muscle. Patient has not taken any medication for pain.   The history is provided by the patient. No language interpreter was used.  Neck Pain  Pain location:  R side Quality:  Aching Pain radiates to:  Does not radiate Pain severity:  Moderate Pain is:  Same all the time Duration:  2 hours Timing:  Constant Progression:  Unchanged Chronicity:  New Relieved by: stretching. Worsened by:  Position Ineffective treatments:  Heat Associated symptoms: no bladder incontinence, no bowel incontinence, no chest pain, no fever and no headaches     Past Medical History:  Diagnosis Date  . Hypertension   . No pertinent past medical history   . STD (female) 2013   Chlamydia    Patient Active Problem List   Diagnosis Date Noted  . Miscarriage 11/03/2017  . Premature rupture of membranes 11/03/2017    Past Surgical History:  Procedure Laterality Date  . TOOTH EXTRACTION       OB History    Gravida  5   Para  1   Term      Preterm      AB  4   Living  0     SAB  2   TAB  2   Ectopic      Multiple  0   Live Births               Home Medications    Prior to Admission medications   Medication Sig Start Date End Date Taking? Authorizing Provider  cyclobenzaprine (FLEXERIL) 5 MG tablet Take 1 tablet (5 mg total) by mouth 3 (three) times daily as needed for muscle spasms. 12/22/18   Janne Napoleon, NP  naproxen (NAPROSYN) 375 MG tablet Take 1 tablet (375 mg total) by mouth 2 (two) times daily. 12/22/18   Janne Napoleon, NP    Family History Family  History  Problem Relation Age of Onset  . Alcohol abuse Neg Hx   . Arthritis Neg Hx   . Asthma Neg Hx   . Birth defects Neg Hx   . Cancer Neg Hx   . COPD Neg Hx   . Depression Neg Hx   . Diabetes Neg Hx   . Drug abuse Neg Hx   . Early death Neg Hx   . Hearing loss Neg Hx   . Heart disease Neg Hx   . Hyperlipidemia Neg Hx   . Hypertension Neg Hx   . Kidney disease Neg Hx   . Learning disabilities Neg Hx   . Mental illness Neg Hx   . Mental retardation Neg Hx   . Miscarriages / Stillbirths Neg Hx   . Stroke Neg Hx   . Vision loss Neg Hx     Social History Social History   Tobacco Use  . Smoking status: Former Smoker    Packs/day: 1.00    Types: Cigars    Last attempt to quit: 10/20/2012    Years since quitting: 6.1  . Smokeless tobacco: Never Used  Substance Use Topics  . Alcohol use: No    Comment: socially  . Drug use: No    Types: Marijuana     Allergies   Patient has no known allergies.   Review of Systems Review of Systems  Constitutional: Negative for chills and fever.  HENT: Negative.   Respiratory: Negative for cough and shortness of breath.   Cardiovascular: Negative for chest pain.  Gastrointestinal: Negative for abdominal pain, bowel incontinence, nausea and vomiting.  Genitourinary: Negative for bladder incontinence.  Musculoskeletal: Positive for neck pain. Negative for back pain.  Skin: Negative for wound.  Neurological: Negative for headaches.  Psychiatric/Behavioral: Negative for confusion.     Physical Exam Updated Vital Signs BP 123/88 (BP Location: Right Arm)   Pulse 89   Temp 98.4 F (36.9 C) (Oral)   Resp 18   SpO2 100%   Physical Exam Vitals signs and nursing note reviewed.  Constitutional:      General: She is not in acute distress.    Appearance: She is well-developed.  HENT:     Head: Normocephalic and atraumatic.     Right Ear: Tympanic membrane normal.     Left Ear: Tympanic membrane normal.     Nose: Nose  normal.     Mouth/Throat:     Mouth: Mucous membranes are moist.     Pharynx: Oropharynx is clear.  Eyes:     Extraocular Movements: Extraocular movements intact.     Conjunctiva/sclera: Conjunctivae normal.  Neck:     Musculoskeletal: Neck supple. Pain with movement and muscular tenderness (right side) present. No spinous process tenderness.     Comments: No tenderness over the c-spine, muscle spasm noted right side of neck. Tender on exam. No meningeal signs.  Cardiovascular:     Rate and Rhythm: Normal rate.  Pulmonary:     Effort: Pulmonary effort is normal.  Musculoskeletal: Normal range of motion.  Lymphadenopathy:     Cervical: No cervical adenopathy.  Skin:    General: Skin is warm and dry.  Neurological:     Mental Status: She is alert and oriented to person, place, and time.     Cranial Nerves: No cranial nerve deficit.     Sensory: Sensation is intact.     Motor: Motor function is intact.     Gait: Gait normal.     Deep Tendon Reflexes:     Reflex Scores:      Bicep reflexes are 2+ on the right side and 2+ on the left side.      Brachioradialis reflexes are 2+ on the right side and 2+ on the left side.      Patellar reflexes are 2+ on the right side and 2+ on the left side.    Comments: Grips are equal, radial pulses 2+.  Psychiatric:        Mood and Affect: Mood normal.      ED Treatments / Results  Labs (all labs ordered are listed, but only abnormal results are displayed) Labs Reviewed - No data to display  Radiology No results found.  Procedures Procedures (including critical care time)  Medications Ordered in ED Medications  cyclobenzaprine (FLEXERIL) tablet 10 mg (has no administration in time range)  ibuprofen (ADVIL,MOTRIN) tablet 600 mg (has no administration in time range)     Initial Impression / Assessment and Plan / ED Course  I have reviewed the triage vital signs and the nursing notes.  28 y.o. female here with right side neck pain  that started suddenly while stretching stable for d/c without fever or meningeal signs. Will treat for torticollis. Patient to f/u with PCP, return precautions discussed.  Final Clinical Impressions(s) / ED Diagnoses   Final diagnoses:  Torticollis, acute    ED Discharge Orders         Ordered    cyclobenzaprine (FLEXERIL) 5 MG tablet  3 times daily PRN     12/22/18 1224    naproxen (NAPROSYN) 375 MG tablet  2 times daily     12/22/18 [address]1224           Arisbeth Purrington, [place] M, [place]NP 12/22/18 1227    Shaune PollackIsaacs, [place], MD 12/23/18 [phone]

## 2018-12-22 NOTE — Discharge Instructions (Signed)
Do not drive when taking the muscle relaxer as it will make you sleepy. Follow up with your doctor or return here for worsening symptoms.

## 2019-03-13 ENCOUNTER — Encounter (HOSPITAL_COMMUNITY): Payer: Self-pay | Admitting: Advanced Practice Midwife

## 2019-03-13 ENCOUNTER — Inpatient Hospital Stay (HOSPITAL_COMMUNITY)
Admission: EM | Admit: 2019-03-13 | Discharge: 2019-03-13 | Discharge status: Against Medical Advice | Payer: Medicaid Other | Attending: Obstetrics and Gynecology | Admitting: Obstetrics and Gynecology

## 2019-03-13 ENCOUNTER — Other Ambulatory Visit: Payer: Self-pay

## 2019-03-13 DIAGNOSIS — O26891 Other specified pregnancy related conditions, first trimester: Secondary | ICD-10-CM | POA: Diagnosis not present

## 2019-03-13 DIAGNOSIS — Z87891 Personal history of nicotine dependence: Secondary | ICD-10-CM | POA: Diagnosis not present

## 2019-03-13 DIAGNOSIS — Z3A01 Less than 8 weeks gestation of pregnancy: Secondary | ICD-10-CM

## 2019-03-13 DIAGNOSIS — O219 Vomiting of pregnancy, unspecified: Secondary | ICD-10-CM | POA: Diagnosis present

## 2019-03-13 DIAGNOSIS — R824 Acetonuria: Secondary | ICD-10-CM | POA: Diagnosis not present

## 2019-03-13 DIAGNOSIS — R197 Diarrhea, unspecified: Secondary | ICD-10-CM | POA: Diagnosis not present

## 2019-03-13 DIAGNOSIS — Z3A09 9 weeks gestation of pregnancy: Secondary | ICD-10-CM | POA: Diagnosis not present

## 2019-03-13 LAB — URINALYSIS, ROUTINE W REFLEX MICROSCOPIC
Bacteria, UA: NONE SEEN
Bilirubin Urine: NEGATIVE
Glucose, UA: NEGATIVE mg/dL
Hgb urine dipstick: NEGATIVE
Ketones, ur: 80 mg/dL — AB
Nitrite: NEGATIVE
Protein, ur: 30 mg/dL — AB
Specific Gravity, Urine: 1.027 (ref 1.005–1.030)
pH: 6 (ref 5.0–8.0)

## 2019-03-13 MED ORDER — LACTATED RINGERS IV BOLUS: 1000.0000 mL | Freq: Once | INTRAVENOUS | Status: DC

## 2019-03-13 MED ORDER — M.V.I. ADULT IV INJ
Freq: Once | INTRAVENOUS | Status: DC
  Filled 2019-03-13: qty 10

## 2019-03-13 NOTE — MAU Provider Note (Signed)
History     CSN: [id number]  Arrival date and time: 03/13/19 1445   First Provider Initiated Contact with Patient 03/13/19 1609      Chief Complaint  Patient presents with  . Emesis  . Nausea   HPI Carrie Hampton is a 28 y.o. [id number] who presents to MAU with chief complaint of nausea and vomiting. This is a recurring problem, onset three days ago. Patient reports "mostly nausea" as well as lack of appetite. She was able to tolerate a sandwich and grapes as well as water yesterday. She also reports diarrhea for the past 2-3 days. She denies abdominal pain, abnormal vaginal discharge, vaginal bleeding fever or cough. She also denies recent exposure to ill people.  OB History    Gravida  5   Para  1   Term      Preterm      AB  4   Living  0     SAB  2   TAB  2   Ectopic      Multiple  0   Live Births              Past Medical History:  Diagnosis Date  . Hypertension   . No pertinent past medical history   . STD (female) 2013   Chlamydia    Past Surgical History:  Procedure Laterality Date  . TOOTH EXTRACTION      Family History  Problem Relation Age of Onset  . Alcohol abuse Neg Hx   . Arthritis Neg Hx   . Asthma Neg Hx   . Birth defects Neg Hx   . Cancer Neg Hx   . COPD Neg Hx   . Depression Neg Hx   . Diabetes Neg Hx   . Drug abuse Neg Hx   . Early death Neg Hx   . Hearing loss Neg Hx   . Heart disease Neg Hx   . Hyperlipidemia Neg Hx   . Hypertension Neg Hx   . Kidney disease Neg Hx   . Learning disabilities Neg Hx   . Mental illness Neg Hx   . Mental retardation Neg Hx   . Miscarriages / Stillbirths Neg Hx   . Stroke Neg Hx   . Vision loss Neg Hx     Social History   Tobacco Use  . Smoking status: Former Smoker    Packs/day: 1.00    Types: Cigars    Last attempt to quit: 10/20/2012    Years since quitting: 6.3  . Smokeless tobacco: Never Used  Substance Use Topics  . Alcohol use: No    Comment: socially  . Drug use:  No    Types: Marijuana    Allergies: No Known Allergies  Medications Prior to Admission  Medication Sig Dispense Refill Last Dose  . cyclobenzaprine (FLEXERIL) 5 MG tablet Take 1 tablet (5 mg total) by mouth 3 (three) times daily as needed for muscle spasms. 30 tablet 0   . naproxen (NAPROSYN) 375 MG tablet Take 1 tablet (375 mg total) by mouth 2 (two) times daily. 20 tablet 0     Review of Systems  Constitutional: Negative for chills and fever.  Gastrointestinal: Positive for diarrhea, nausea and vomiting.  Genitourinary: Negative for difficulty urinating, vaginal bleeding, vaginal discharge and vaginal pain.  Neurological: Negative for syncope and headaches.  All other systems reviewed and are negative.  Physical Exam   Blood pressure 116/73, pulse 71, temperature 98.5 F (36.9 C), temperature source  Oral, resp. rate 16, height 5\' 4"  (1.626 m), weight 57.6 kg, SpO2 100 %, unknown if currently breastfeeding.  Physical Exam  Nursing note and vitals reviewed. Constitutional: She is oriented to person, place, and time. She appears well-developed and well-nourished.  HENT:  Mouth/Throat: Oropharynx is clear and moist and mucous membranes are normal. Mucous membranes are not pale, not dry and not cyanotic.  Eyes: Conjunctivae are normal.  Cardiovascular: Normal rate.  Respiratory: Effort normal. No respiratory distress.  GI: Soft. She exhibits no distension. There is no abdominal tenderness. There is no rebound and no guarding.  Neurological: She is alert and oriented to person, place, and time.  Skin: Skin is warm and dry.  Psychiatric: She has a normal mood and affect. Her behavior is normal. Judgment and thought content normal.    MAU Course/MDM  Procedures  Patient Vitals for the past 24 hrs:  BP Temp Temp src Pulse Resp SpO2 Height Weight  03/13/19 1539 116/73 - - 71 - 100 % 5\' 4"  (1.626 m) 57.6 kg  03/13/19 1448 119/74 98.5 F (36.9 C) Oral 70 16 100 % - -    Results  for orders placed or performed during the hospital encounter of 03/13/19 (from the past 24 hour(s))  Urinalysis, Routine w reflex microscopic     Status: Abnormal   Collection Time: 03/13/19  3:50 PM  Result Value Ref Range   Color, Urine YELLOW YELLOW   APPearance HAZY (A) CLEAR   Specific Gravity, Urine 1.027 1.005 - 1.030   pH 6.0 5.0 - 8.0   Glucose, UA NEGATIVE NEGATIVE mg/dL   Hgb urine dipstick NEGATIVE NEGATIVE   Bilirubin Urine NEGATIVE NEGATIVE   Ketones, ur 80 (A) NEGATIVE mg/dL   Protein, ur 30 (A) NEGATIVE mg/dL   Nitrite NEGATIVE NEGATIVE   Leukocytes,Ua SMALL (A) NEGATIVE   RBC / HPF 0-5 0 - 5 RBC/hpf   WBC, UA 0-5 0 - 5 WBC/hpf   Bacteria, UA NONE SEEN NONE SEEN   Squamous Epithelial / LPF 11-20 0 - 5   Mucus PRESENT     Orders Placed This Encounter  Procedures  . Urinalysis, Routine w reflex microscopic  . Insert peripheral IV   Meds ordered this encounter  Medications  . lactated ringers bolus 1,000 mL  . multivitamins adult (INFUVITE ADULT) 10 mL in lactated ringers 1,000 mL infusion    Assessment and Plan  --28 y.o. [id number] at 9 weeks by LMP --Ketonuria --Nausea and vomiting in first trimester --Patient left AMA following initial CNM assessment  F/U: Patient has New OB scheduled for 03/24/2019  Calvert CantorSamantha C Nijee Heatwole, CNM 03/13/2019, 5:29 PM

## 2019-03-13 NOTE — MAU Note (Signed)
Pt stated she want to leave. Explained   That IV fluidsand nausea medicine had been ordered and I was ready to start  it . Pt stated she could not stay. AMA form signed.

## 2019-03-13 NOTE — MAU Note (Signed)
Pt reports she hs not been able to eat for the past few days. Eats and it comes right back up nauseated all the time and has had diarrhea .

## 2019-03-24 ENCOUNTER — Encounter: Payer: Self-pay | Admitting: Advanced Practice Midwife

## 2019-03-24 ENCOUNTER — Ambulatory Visit (INDEPENDENT_AMBULATORY_CARE_PROVIDER_SITE_OTHER): Payer: Medicaid Other | Admitting: Advanced Practice Midwife

## 2019-03-24 ENCOUNTER — Other Ambulatory Visit: Payer: Self-pay

## 2019-03-24 ENCOUNTER — Other Ambulatory Visit (HOSPITAL_COMMUNITY)
Admission: RE | Admit: 2019-03-24 | Discharge: 2019-03-24 | Disposition: A | Discharge status: Home / Self Care | Payer: Medicaid Other | Source: Ambulatory Visit | Attending: Advanced Practice Midwife | Admitting: Advanced Practice Midwife

## 2019-03-24 VITALS — BP 116/80 | HR 97 | Temp 97.5°F | Wt 119.0 lb

## 2019-03-24 DIAGNOSIS — O219 Vomiting of pregnancy, unspecified: Secondary | ICD-10-CM

## 2019-03-24 DIAGNOSIS — Z3A01 Less than 8 weeks gestation of pregnancy: Secondary | ICD-10-CM

## 2019-03-24 DIAGNOSIS — O0991 Supervision of high risk pregnancy, unspecified, first trimester: Secondary | ICD-10-CM | POA: Diagnosis not present

## 2019-03-24 DIAGNOSIS — O0990 Supervision of high risk pregnancy, unspecified, unspecified trimester: Secondary | ICD-10-CM | POA: Insufficient documentation

## 2019-03-24 DIAGNOSIS — Z8742 Personal history of other diseases of the female genital tract: Secondary | ICD-10-CM

## 2019-03-24 DIAGNOSIS — Z8759 Personal history of other complications of pregnancy, childbirth and the puerperium: Secondary | ICD-10-CM

## 2019-03-24 MED ORDER — DOXYLAMINE-PYRIDOXINE 10-10 MG PO TBEC: DELAYED_RELEASE_TABLET | ORAL | 5 refills | Status: DC

## 2019-03-24 NOTE — Progress Notes (Signed)
NOB Planned: YES  Genetic Screening: Desires  Last pap: N/A  FHT: not heard CNM will assist w/ finding FHT's.   CC: Nausea and vomiting, pt states she is constantly throwing up. Pt also notes fatigue.   *Pt had delivery  11/03/2017 at 20 wks

## 2019-03-24 NOTE — Progress Notes (Signed)
Subjective:   Carrie Hampton is a 28 y.o. G7P0040 at [redacted]w[redacted]d by sure LMP being seen today for her first obstetrical visit.  Her obstetrical history is significant for 19 week PPROM and delivery and has Miscarriage; Premature rupture of membranes; and Supervision of normal pregnancy, antepartum on their problem list.. Patient does intend to breast feed. Pregnancy history fully reviewed.  Patient reports fatigue and nausea.  HISTORY: OB History  Gravida Para Term Preterm AB Living  7 1 0 0 4 0  SAB TAB Ectopic Multiple Live Births  2 2 0 0 0    # Outcome Date GA Lbr Len/2nd Weight Sex Delivery Anes PTL Lv  7 Current           6 Para 11/03/17 [redacted]w[redacted]d  200 g M Vag-Spont   FD     Name: AVIANNAH, BOY FD  5 SAB 2014          4 SAB 2013             Birth Comments: System Generated. Please review and update pregnancy details.  3 Gravida           2 TAB           1 TAB            Past Medical History:  Diagnosis Date  . Hypertension   . No pertinent past medical history   . STD (female) 2013   Chlamydia   Past Surgical History:  Procedure Laterality Date  . TOOTH EXTRACTION     Family History  Problem Relation Age of Onset  . Alcohol abuse Neg Hx   . Arthritis Neg Hx   . Asthma Neg Hx   . Birth defects Neg Hx   . Cancer Neg Hx   . COPD Neg Hx   . Depression Neg Hx   . Diabetes Neg Hx   . Drug abuse Neg Hx   . Early death Neg Hx   . Hearing loss Neg Hx   . Heart disease Neg Hx   . Hyperlipidemia Neg Hx   . Hypertension Neg Hx   . Kidney disease Neg Hx   . Learning disabilities Neg Hx   . Mental illness Neg Hx   . Mental retardation Neg Hx   . Miscarriages / Stillbirths Neg Hx   . Stroke Neg Hx   . Vision loss Neg Hx    Social History   Tobacco Use  . Smoking status: Former Smoker    Packs/day: 1.00    Types: Cigars    Last attempt to quit: 10/20/2012    Years since quitting: 6.4  . Smokeless tobacco: Never Used  Substance Use Topics  . Alcohol use:  No    Comment: socially  . Drug use: No    Types: Marijuana    Comment: none per pt 03/24/19   No Known Allergies Current Outpatient Medications on File Prior to Visit  Medication Sig Dispense Refill  . cyclobenzaprine (FLEXERIL) 5 MG tablet Take 1 tablet (5 mg total) by mouth 3 (three) times daily as needed for muscle spasms. (Patient not taking: Reported on 03/24/2019) 30 tablet 0  . naproxen (NAPROSYN) 375 MG tablet Take 1 tablet (375 mg total) by mouth 2 (two) times daily. (Patient not taking: Reported on 03/24/2019) 20 tablet 0   No current facility-administered medications on file prior to visit.      Exam   Vitals:   03/24/19 0930  BP: 116/80  Pulse: 97  Temp: (!) 97.5 F (36.4 C)  Weight: 54 kg      Uterus:     Pelvic Exam: Perineum: no hemorrhoids, normal perineum   Vulva: normal external genitalia, no lesions   Vagina:  normal mucosa, normal discharge   Cervix: no lesions and normal, pap smear done.    Adnexa: normal adnexa and no mass, fullness, tenderness   Bony Pelvis: average  System: General: well-developed, well-nourished female in no acute distress   Breast:  normal appearance, no masses or tenderness   Skin: normal coloration and turgor, no rashes   Neurologic: oriented, normal, negative, normal mood   Extremities: normal strength, tone, and muscle mass, ROM of all joints is normal   HEENT PERRLA, extraocular movement intact and sclera clear, anicteric   Mouth/Teeth mucous membranes moist, pharynx normal without lesions and dental hygiene good   Neck supple and no masses   Cardiovascular: regular rate and rhythm   Respiratory:  no respiratory distress, normal breath sounds   Abdomen: soft, non-tender; bowel sounds normal; no masses,  no organomegaly     Assessment:   Pregnancy: G7P0040 Patient Active Problem List   Diagnosis Date Noted  . Supervision of normal pregnancy, antepartum 03/24/2019  . Miscarriage 11/03/2017  . Premature rupture of  membranes 11/03/2017     Plan:   1. Nausea and vomiting during pregnancy prior to [redacted] weeks gestation --Pt reports ~ 10 lbs weight loss in first trimester.  --Discussed dietary changes for n/v.  Pt to call office if not improved with Diclegis so additional medications can be sent as needed. - Doxylamine-Pyridoxine (DICLEGIS) 10-10 MG TBEC; Take 2 tabs at bedtime. If needed, add another tab in the morning. If needed, add another tab in the afternoon, up to 4 tabs/day.  Dispense: 100 tablet; Refill: 5  2. Supervision of high risk pregnancy, antepartum --Anticipatory guidance about next visits/weeks of pregnancy given. --Reviewed safety, visitor policy, reassurance about COVID-19 for pregnancy at this time. Discussed possible changes to visits, including televisits, that may occur due to COVID-19.  The office remains open if pt needs to be seen and MAU is open 24 hours/day for OB emergencies.   - Obstetric Panel, Including HIV - Culture, OB Urine - Genetic Screening - Cervicovaginal ancillary only( [place]) - Cytology - PAP( [place]) - Babyscripts Schedule Optimization  3. History of cervical incompetence --Pt with PPROM and cervical dilation of 3 cm on admission at 19 weeks.  She was given Cytotec to induce labor of nonviable fetus but per MD note on 11/04/19 at time of her discharge recommends considering cervical incompetence as explanation.  - AMB referral to maternal fetal medicine  Initial labs drawn. Continue prenatal vitamins. Discussed and offered genetic screening options, including Quad screen/AFP, NIPS testing, and option to decline testing. Benefits/risks/alternatives reviewed. Pt aware that anatomy US is form of genetic screening with lower accuracy in detecting trisomies than blood work.  Pt chooses/declines genetic screening today. NIPS: ordered. Ultrasound discussed; fetal anatomic survey: requested. Problem list reviewed and updated. The nature [facility] with multiple MDs and other Advanced Practice Providers was explained to patient; also emphasized that residents, students are part of our team. Routine obstetric precautions reviewed. Return in about 4 weeks (around 04/21/2019).   Sharen CounterLisa Leftwich-Kirby, CNM 03/24/19 9:54 AM

## 2019-03-24 NOTE — Patient Instructions (Signed)

## 2019-03-25 ENCOUNTER — Other Ambulatory Visit: Payer: Self-pay | Admitting: *Deleted

## 2019-03-25 DIAGNOSIS — O0990 Supervision of high risk pregnancy, unspecified, unspecified trimester: Secondary | ICD-10-CM

## 2019-03-25 LAB — OBSTETRIC PANEL, INCLUDING HIV
Antibody Screen: NEGATIVE
Basophils Absolute: 0 10*3/uL (ref 0.0–0.2)
Basos: 0 %
EOS (ABSOLUTE): 0.1 10*3/uL (ref 0.0–0.4)
Eos: 1 %
HIV Screen 4th Generation wRfx: NONREACTIVE
Hematocrit: 43.5 % (ref 34.0–46.6)
Hemoglobin: 15.1 g/dL (ref 11.1–15.9)
Hepatitis B Surface Ag: NEGATIVE
Immature Grans (Abs): 0 10*3/uL (ref 0.0–0.1)
Immature Granulocytes: 1 %
Lymphocytes Absolute: 1.2 10*3/uL (ref 0.7–3.1)
Lymphs: 21 %
MCH: 29.3 pg (ref 26.6–33.0)
MCHC: 34.7 g/dL (ref 31.5–35.7)
MCV: 84 fL (ref 79–97)
Monocytes Absolute: 0.3 10*3/uL (ref 0.1–0.9)
Monocytes: 6 %
Neutrophils Absolute: 4.1 10*3/uL (ref 1.4–7.0)
Neutrophils: 71 %
Platelets: 218 10*3/uL (ref 150–450)
RBC: 5.16 x10E6/uL (ref 3.77–5.28)
RDW: 12.8 % (ref 11.7–15.4)
RPR Ser Ql: NONREACTIVE
Rh Factor: POSITIVE
Rubella Antibodies, IGG: 5.92 index (ref >0.99)
WBC: 5.8 10*3/uL (ref 3.4–10.8)

## 2019-03-25 LAB — CERVICOVAGINAL ANCILLARY ONLY
Chlamydia: NEGATIVE
Neisseria Gonorrhea: NEGATIVE
Trichomonas: NEGATIVE

## 2019-03-25 LAB — CYTOLOGY - PAP: Diagnosis: NEGATIVE

## 2019-03-25 NOTE — Progress Notes (Signed)
Us ordered

## 2019-03-27 ENCOUNTER — Ambulatory Visit (HOSPITAL_COMMUNITY)
Admission: RE | Admit: 2019-03-27 | Discharge: 2019-03-27 | Disposition: A | Discharge status: Home / Self Care | Payer: Medicaid Other | Source: Ambulatory Visit | Attending: Advanced Practice Midwife | Admitting: Advanced Practice Midwife

## 2019-03-27 ENCOUNTER — Ambulatory Visit (HOSPITAL_COMMUNITY): Payer: Medicaid Other | Admitting: Obstetrics and Gynecology

## 2019-03-27 ENCOUNTER — Ambulatory Visit (HOSPITAL_COMMUNITY): Payer: Medicaid Other | Admitting: *Deleted

## 2019-03-27 ENCOUNTER — Encounter (HOSPITAL_COMMUNITY): Payer: Self-pay

## 2019-03-27 ENCOUNTER — Other Ambulatory Visit: Payer: Self-pay

## 2019-03-27 VITALS — BP 115/76 | HR 90 | Temp 98.6°F

## 2019-03-27 DIAGNOSIS — Z3687 Encounter for antenatal screening for uncertain dates: Secondary | ICD-10-CM

## 2019-03-27 DIAGNOSIS — Z3A08 8 weeks gestation of pregnancy: Secondary | ICD-10-CM

## 2019-03-27 DIAGNOSIS — O3431 Maternal care for cervical incompetence, first trimester: Secondary | ICD-10-CM

## 2019-03-27 DIAGNOSIS — Z363 Encounter for antenatal screening for malformations: Secondary | ICD-10-CM | POA: Diagnosis not present

## 2019-03-27 DIAGNOSIS — O0990 Supervision of high risk pregnancy, unspecified, unspecified trimester: Secondary | ICD-10-CM | POA: Insufficient documentation

## 2019-03-27 LAB — URINE CULTURE, OB REFLEX

## 2019-03-27 LAB — CULTURE, OB URINE

## 2019-03-27 NOTE — Consult Note (Addendum)
Maternal-Fetal Medicine   Name: Carrie Hampton MRN: [id number] Requesting Provider: Sharen Counter, CNM  I had the pleasure of seeing [name] [facility]. She is here for ultrasound and consultation because of history of mid-trimester pregnancy loss.  In December 2018, at 19-weeks' gestation, the patient was evaluated after c/o leakage of amniotic fluid ([facility], [facility]). Patient did not have vaginal bleeding in that pregnancy. On speculum examination, the cervix was found to be dilated with membrane in the vagina (inevitable miscarriage). On ultrasound, the cervical canal was completely dilated. Patient was counseled and underwent termination with Cytotec. A nonviable female fetus was delivered.  Obstetric history is also significant for 2 early TOPs and 2 early SABs.  Gyn: No history of abnormal Pap smears or cervical surgeries. PMH: Patient reports she has hypertension, but never took antihypertensives. She reports she does not have sickle-cell trait. PSH: Nil of note. Allergies: NKDA. Social: Denies tobacco or drug or alcohol use. She lives with her boyfriend, who is an Tree surgeon and he is in good health. Family: No history of venous thromboembolism. Her parents are in good health.  On today's ultrasound, an intrauterine pregnancy is confirmed. The CRL measurement is consistent with 8w 2d and her EDD based on today's ultrasound is 11/04/2019. On transabdominal scan, the cervix appears long and closed.  I counseled the patient on the following: History of second-trimester pregnancy loss: Her history of PPROM and the finding of dilated cervix strongly raises the possibility of cervical incompetence. I explained the diagnosis with help of diagrams. I informed her that history of cervical incompetence increases the likelihood of recurrent second-trimester miscarriage or preterm delivery.  I discussed the options including prophylactic cerclage at 12 to 13  weeks (recommended). Alternatively, she can take vaginal progesterone daily from 16 weeks or opt for weekly cervical length measurements from 16 weeks and perform rescue cerclage if cervical shortening is seen. I discussed the procedure and possible complications including miscarriage, infection, bleeding and injuries to bladder or bowel (rare). Patient opted for prophylactic cerclage.  Patient gives history of hypertension.  I discussed the benefit of low-dose aspirin that can delay or prevent preeclampsia.  Thank you for your consult. Please do not hesitate to contact me if you have any questions or concerns.  Consultation including face-to-face counseling: 40 min.

## 2019-03-28 ENCOUNTER — Encounter: Payer: Self-pay | Admitting: Advanced Practice Midwife

## 2019-03-28 DIAGNOSIS — O09299 Supervision of pregnancy with other poor reproductive or obstetric history, unspecified trimester: Secondary | ICD-10-CM | POA: Insufficient documentation

## 2019-04-01 ENCOUNTER — Encounter: Payer: Self-pay | Admitting: Advanced Practice Midwife

## 2019-04-03 ENCOUNTER — Encounter: Payer: Self-pay | Admitting: Advanced Practice Midwife

## 2019-04-07 ENCOUNTER — Other Ambulatory Visit: Payer: Self-pay | Admitting: Obstetrics and Gynecology

## 2019-04-07 ENCOUNTER — Encounter: Payer: Self-pay | Admitting: Obstetrics and Gynecology

## 2019-04-08 ENCOUNTER — Encounter (HOSPITAL_COMMUNITY): Payer: Self-pay | Admitting: *Deleted

## 2019-04-16 ENCOUNTER — Telehealth (HOSPITAL_COMMUNITY): Payer: Self-pay | Admitting: *Deleted

## 2019-04-16 NOTE — Telephone Encounter (Signed)
Attempted to call for a covid test prior to her cerclage.  Unable to contact pt

## 2019-04-17 ENCOUNTER — Telehealth (HOSPITAL_COMMUNITY): Payer: Self-pay | Admitting: *Deleted

## 2019-04-17 NOTE — Telephone Encounter (Signed)
Preadmission screen  

## 2019-04-17 NOTE — Telephone Encounter (Signed)
covid test

## 2019-04-18 ENCOUNTER — Other Ambulatory Visit: Payer: Self-pay

## 2019-04-18 ENCOUNTER — Other Ambulatory Visit (HOSPITAL_COMMUNITY)
Admission: RE | Admit: 2019-04-18 | Discharge: 2019-04-18 | Disposition: A | Discharge status: Home / Self Care | Payer: Medicaid Other | Source: Ambulatory Visit | Attending: Family Medicine | Admitting: Family Medicine

## 2019-04-18 ENCOUNTER — Inpatient Hospital Stay (HOSPITAL_COMMUNITY): Admission: RE | Admit: 2019-04-18 | Payer: Medicaid Other | Source: Ambulatory Visit

## 2019-04-18 DIAGNOSIS — Z01812 Encounter for preprocedural laboratory examination: Secondary | ICD-10-CM | POA: Diagnosis not present

## 2019-04-18 DIAGNOSIS — Z1159 Encounter for screening for other viral diseases: Secondary | ICD-10-CM | POA: Insufficient documentation

## 2019-04-18 NOTE — MAU Note (Signed)
Asymptomatic. Swab collected without difficulty . 

## 2019-04-19 LAB — NOVEL CORONAVIRUS, NAA (HOSP ORDER, SEND-OUT TO REF LAB; TAT 18-24 HRS): SARS-CoV-2, NAA: NOT DETECTED

## 2019-04-22 ENCOUNTER — Encounter (HOSPITAL_COMMUNITY)
Admission: RE | Disposition: A | Discharge status: Home / Self Care | Payer: Self-pay | Source: Home / Self Care | Attending: Obstetrics and Gynecology

## 2019-04-22 ENCOUNTER — Ambulatory Visit (HOSPITAL_COMMUNITY): Payer: Medicaid Other | Admitting: Anesthesiology

## 2019-04-22 ENCOUNTER — Encounter (HOSPITAL_COMMUNITY): Payer: Self-pay | Admitting: General Practice

## 2019-04-22 ENCOUNTER — Ambulatory Visit (HOSPITAL_COMMUNITY)
Admission: RE | Admit: 2019-04-22 | Discharge: 2019-04-22 | Disposition: A | Discharge status: Home / Self Care | Payer: Medicaid Other | Attending: Obstetrics and Gynecology | Admitting: Obstetrics and Gynecology

## 2019-04-22 ENCOUNTER — Encounter: Payer: Medicaid Other | Admitting: Obstetrics and Gynecology

## 2019-04-22 DIAGNOSIS — Z87891 Personal history of nicotine dependence: Secondary | ICD-10-CM | POA: Insufficient documentation

## 2019-04-22 DIAGNOSIS — O2621 Pregnancy care for patient with recurrent pregnancy loss, first trimester: Secondary | ICD-10-CM | POA: Insufficient documentation

## 2019-04-22 DIAGNOSIS — Z3A12 12 weeks gestation of pregnancy: Secondary | ICD-10-CM | POA: Insufficient documentation

## 2019-04-22 DIAGNOSIS — O3431 Maternal care for cervical incompetence, first trimester: Secondary | ICD-10-CM | POA: Diagnosis not present

## 2019-04-22 DIAGNOSIS — O3430 Maternal care for cervical incompetence, unspecified trimester: Secondary | ICD-10-CM | POA: Diagnosis not present

## 2019-04-22 DIAGNOSIS — O161 Unspecified maternal hypertension, first trimester: Secondary | ICD-10-CM | POA: Diagnosis not present

## 2019-04-22 DIAGNOSIS — Z408 Encounter for other prophylactic surgery: Secondary | ICD-10-CM | POA: Diagnosis not present

## 2019-04-22 DIAGNOSIS — R030 Elevated blood-pressure reading, without diagnosis of hypertension: Secondary | ICD-10-CM

## 2019-04-22 DIAGNOSIS — O09299 Supervision of pregnancy with other poor reproductive or obstetric history, unspecified trimester: Secondary | ICD-10-CM

## 2019-04-22 HISTORY — PX: CERVICAL CERCLAGE: SHX1329

## 2019-04-22 LAB — CBC
HCT: 40.8 % (ref 36.0–46.0)
Hemoglobin: 14.1 g/dL (ref 12.0–15.0)
MCH: 29.1 pg (ref 26.0–34.0)
MCHC: 34.6 g/dL (ref 30.0–36.0)
MCV: 84.3 fL (ref 80.0–100.0)
Platelets: 200 10*3/uL (ref 150–400)
RBC: 4.84 MIL/uL (ref 3.87–5.11)
RDW: 11.9 % (ref 11.5–15.5)
WBC: 5.8 10*3/uL (ref 4.0–10.5)
nRBC: 0 % (ref 0.0–0.2)

## 2019-04-22 LAB — TYPE AND SCREEN
ABO/RH(D): O POS
Antibody Screen: NEGATIVE

## 2019-04-22 SURGERY — CERCLAGE, CERVIX, VAGINAL APPROACH
Anesthesia: Spinal | Wound class: Clean Contaminated

## 2019-04-22 MED ORDER — SOD CITRATE-CITRIC ACID 500-334 MG/5ML PO SOLN
30.0000 mL | ORAL | Status: AC
  Administered 2019-04-22: 30 mL via ORAL

## 2019-04-22 MED ORDER — IBUPROFEN 100 MG/5ML PO SUSP
600.0000 mg | Freq: Once | ORAL | Status: AC
  Administered 2019-04-22: 12:00:00 600 mg via ORAL
  Filled 2019-04-22: qty 30

## 2019-04-22 MED ORDER — IBUPROFEN 100 MG/5ML PO SUSP: 600.0000 mg | Freq: Four times a day (QID) | ORAL | 0 refills | Status: AC

## 2019-04-22 MED ORDER — FENTANYL CITRATE (PF) 100 MCG/2ML IJ SOLN
INTRAMUSCULAR | Status: DC | PRN
  Administered 2019-04-22 (×2): 50 ug via INTRAVENOUS

## 2019-04-22 MED ORDER — BUPIVACAINE IN DEXTROSE 0.75-8.25 % IT SOLN
INTRATHECAL | Status: DC | PRN
  Administered 2019-04-22: 1 mL via INTRATHECAL

## 2019-04-22 MED ORDER — SOD CITRATE-CITRIC ACID 500-334 MG/5ML PO SOLN
ORAL | Status: AC
  Filled 2019-04-22: qty 15

## 2019-04-22 MED ORDER — CEFAZOLIN SODIUM-DEXTROSE 2-4 GM/100ML-% IV SOLN
2.0000 g | INTRAVENOUS | Status: AC
  Administered 2019-04-22: 2 g via INTRAVENOUS

## 2019-04-22 MED ORDER — OXYCODONE HCL 5 MG PO TABS: 5.0000 mg | ORAL_TABLET | Freq: Once | ORAL | Status: DC | PRN

## 2019-04-22 MED ORDER — PHENYLEPHRINE 40 MCG/ML (10ML) SYRINGE FOR IV PUSH (FOR BLOOD PRESSURE SUPPORT)
PREFILLED_SYRINGE | INTRAVENOUS | Status: AC
  Filled 2019-04-22: qty 10

## 2019-04-22 MED ORDER — ONDANSETRON HCL 4 MG/2ML IJ SOLN
4.0000 mg | Freq: Once | INTRAMUSCULAR | Status: AC | PRN
  Administered 2019-04-22: 4 mg via INTRAVENOUS

## 2019-04-22 MED ORDER — ONDANSETRON HCL 4 MG/2ML IJ SOLN
INTRAMUSCULAR | Status: AC
  Filled 2019-04-22: qty 2

## 2019-04-22 MED ORDER — PHENYLEPHRINE HCL (PRESSORS) 10 MG/ML IV SOLN
INTRAVENOUS | Status: DC | PRN
  Administered 2019-04-22: 80 ug via INTRAVENOUS

## 2019-04-22 MED ORDER — CEFAZOLIN SODIUM-DEXTROSE 2-4 GM/100ML-% IV SOLN
INTRAVENOUS | Status: AC
  Filled 2019-04-22: qty 100

## 2019-04-22 MED ORDER — LACTATED RINGERS IV SOLN
INTRAVENOUS | Status: DC
  Administered 2019-04-22 (×3): via INTRAVENOUS

## 2019-04-22 MED ORDER — FENTANYL CITRATE (PF) 100 MCG/2ML IJ SOLN
INTRAMUSCULAR | Status: AC
  Filled 2019-04-22: qty 2

## 2019-04-22 MED ORDER — IBUPROFEN 600 MG PO TABS: 600.0000 mg | ORAL_TABLET | Freq: Four times a day (QID) | ORAL | 0 refills | Status: DC

## 2019-04-22 MED ORDER — IBUPROFEN 600 MG PO TABS
600.0000 mg | ORAL_TABLET | Freq: Once | ORAL | Status: AC
  Administered 2019-04-22: 600 mg via ORAL

## 2019-04-22 MED ORDER — FENTANYL CITRATE (PF) 100 MCG/2ML IJ SOLN: 25.0000 ug | INTRAMUSCULAR | Status: DC | PRN

## 2019-04-22 MED ORDER — IBUPROFEN 600 MG PO TABS
ORAL_TABLET | ORAL | Status: AC
  Filled 2019-04-22: qty 1

## 2019-04-22 MED ORDER — OXYCODONE HCL 5 MG/5ML PO SOLN: 5.0000 mg | Freq: Once | ORAL | Status: DC | PRN

## 2019-04-22 MED ORDER — ONDANSETRON HCL 4 MG/2ML IJ SOLN
INTRAMUSCULAR | Status: DC | PRN
  Administered 2019-04-22: 4 mg via INTRAVENOUS

## 2019-04-22 SURGICAL SUPPLY — 19 items
CANISTER SUCT 3000ML PPV (MISCELLANEOUS) ×3 IMPLANT
DECANTER SPIKE VIAL GLASS SM (MISCELLANEOUS) IMPLANT
GLOVE BIOGEL PI IND STRL 7.0 (GLOVE) ×2 IMPLANT
GLOVE BIOGEL PI INDICATOR 7.0 (GLOVE) ×4
GLOVE SURG SS PI 7.0 STRL IVOR (GLOVE) ×3 IMPLANT
GOWN STRL REUS W/TWL LRG LVL3 (GOWN DISPOSABLE) ×3 IMPLANT
GOWN STRL REUS W/TWL XL LVL3 (GOWN DISPOSABLE) ×3 IMPLANT
NS IRRIG 1000ML POUR BTL (IV SOLUTION) ×3 IMPLANT
PACK VAGINAL MINOR WOMEN LF (CUSTOM PROCEDURE TRAY) ×3 IMPLANT
PAD OB MATERNITY 4.3X12.25 (PERSONAL CARE ITEMS) ×3 IMPLANT
PAD PREP 24X48 CUFFED NSTRL (MISCELLANEOUS) ×3 IMPLANT
SUT MERSILENE 5MM BP 1 12 (SUTURE) ×5 IMPLANT
SUT SILK 2 0 FSL 18 (SUTURE) ×3 IMPLANT
SUT SILK 2 0 SH (SUTURE) ×2 IMPLANT
TOWEL OR 17X24 6PK STRL BLUE (TOWEL DISPOSABLE) ×6 IMPLANT
TRAY FOLEY W/BAG SLVR 14FR (SET/KITS/TRAYS/PACK) ×3 IMPLANT
TUBING NON-CON 1/4 X 20 CONN (TUBING) IMPLANT
TUBING NON-CON 1/4 X 20' CONN (TUBING)
YANKAUER SUCT BULB TIP NO VENT (SUCTIONS) IMPLANT

## 2019-04-22 NOTE — Discharge Instructions (Addendum)
Cervical Insufficiency Cervical insufficiency is when the cervix is weak and starts to open (dilate) and thin (efface) before the pregnancy is at term and before labor starts. This is also called incompetent cervix. It can happen during the second or third trimester when the fetus starts putting pressure on the cervix. Treatment may reduce the risk of problems for you and your baby. Cervical insufficiency can lead to:  Loss of the baby (miscarriage).  Breaking of the sac that holds the baby (amniotic sac). This is also called preterm premature rupture of the membranes,PPROM.  The baby being born early (preterm birth). What are the causes? The cause of this condition is not well known. However, it may be caused by abnormalities in the cervix and other factors such as inflammation or infection. What increases the risk? This condition is more likely to develop if:  You have a shorter cervix than normal.  Your cervix was damaged or injured during a past pregnancy or surgery.  You were born with a cervical defect.  You have had a procedure done on the cervix, such as cervical biopsy.  You have a history of: ? Cervical insufficiency. ? PPROM.  You have ended several pregnancies through abortion.  You were exposed to the drug diethylstilbestrol (DES). What are the signs or symptoms? Symptoms of this condition can vary. Sometimes there no symptoms for this condition, and at other times there are mild symptoms that start between weeks 14 and 20 of pregnancy. The symptoms may last several days or weeks. These symptoms include:  Light spotting or bleeding from the vagina.  Pelvic pressure.  A change in vaginal discharge, such as changes from clear, white, or light yellow to pink or tan.  Back pain.  Abdominal pain or cramping. How is this diagnosed? This condition may be diagnosed based on:  Your symptoms.  Your medical history, including: ? Any problems during past  pregnancies, such as miscarriages. ? Any procedures performed on your cervix. ? Any history of cervical insufficiency. During the second trimester, cervical insufficiency may be diagnosed based on:  An ultrasound done with a probe inserted into your vagina (transvaginal ultrasound).  A pelvic exam.  Tests of fluid in the amniotic sac. This is done to rule out infection. How is this treated? This condition may be managed by:  Limiting physical activity.  Limiting activity at home or in the hospital.  Pelvic rest. This means that there should be no sexual intercourse or placing anything in the vagina.  A procedure to sew the cervix closed and prevent it from opening too early (cerclage). The stitches (sutures) are removed between weeks 36 and 38 to avoid problems during labor. Cerclage may be recommended if: ? You have a history of miscarriages or preterm births without a known cause. ? You have a short cervix. A short cervix is identified by ultrasound. ? Your cervix has dilated before 24 weeks of pregnancy. Follow these instructions at home:  Get plenty of rest and lessen activity as told by your health care provider. Ask your health care provider what activities are safe for you.  If pelvic rest was recommended, you shouldnot have sex, use tampons, douche, or place anything inside your vagina until your health care provider says that this is okay.  Take over-the-counter and prescription medicines only as told by your health care provider.  Keep all follow-up visits and prenatal visits as told by your health care provider. This is important. Get help right away if:  You have vaginal bleeding, even if it is a small amount or even if it is painless.  You have pain in your abdomen or your lower back.  You have a feeling of increased pressure in your pelvis.  You have vaginal discharge that changes from clear, white, or light yellow to pink or tan.  You have a fever.  You have  severe nausea or vomiting. Summary  Cervical insufficiency is when the cervix is weak and starts to dilate and efface before the pregnancy is at term and before labor starts.  Symptoms of this condition can vary from no symptoms to mild symptoms that start between weeks 14 and 20 of pregnancy. The symptoms may last several days or weeks.  This condition may be managed by limiting physical activity, having pelvic rest, or having cervical cerclage.  If pelvic rest was recommended, you should not have sex, use tampons, use a douche, or place anything inside your vagina until your health care provider says that this is okay. This information is not intended to replace advice given to you by your health care provider. Make sure you discuss any questions you have with your health care provider. Document Released: 11/13/2005 Document Revised: 11/16/2016 Document Reviewed: 11/16/2016 Elsevier Interactive Patient Education  2019 Else[employer] Cervical Cerclage  Cervical cerclage is a surgical procedure to correct a cervix that opens up and thins out before pregnancy is at term (cervical insufficiency, also called incompetent cervix). This condition can cause labor to start early (prematurely). This procedure involves using stitches to sew the cervix shut during pregnancy. Your surgeon may use ultrasound equipment to help guide the procedure and monitor your baby. Ultrasound equipment uses sound waves to take images of your cervix and uterus. Your surgeon will assess these images on a monitor in the operating room. Tell a health care provider about:  Any allergies you have, especially any allergies related to prescribed medicine, stitches, or anesthetic medicines.  All medicines you are taking, including vitamins, herbs, eye drops, creams, and over-the-counter medicines. Bring a list of all of your medicines to your appointment.  Your medical history, including prior labor deliveries.  Any problems  you or family members have had with anesthetic medicines.  Any blood disorders you have.  Any surgeries you have had, including prior cervical stitching.  Any medical conditions you have.  Whether you are pregnant or may be pregnant. What are the risks? Generally, this is a safe procedure. However, problems may occur, including:  Infection, such as infection of the cervix or amniotic sac.  Vaginal bleeding.  Allergic reactions to medicines.  Damage to other structures or organs, such as tearing (rupture) of membranes or cervical laceration.  Premature contractions including going into early labor and delivery.  Cervical dystocia, which occurs when the cervix is unable to dilate normally during labor. What happens before the procedure? Staying hydrated Follow instructions from your health care provider about hydration, which may include:  Up to 2 hours before the procedure - you may continue to drink clear liquids, such as water, clear fruit juice, black coffee, and plain tea. Eating and drinking restrictions Follow instructions from your health care provider about eating and drinking, which may include:  8 hours before the procedure - stop eating heavy meals or foods such as meat, fried foods, or fatty foods.  6 hours before the procedure - stop eating light meals or foods, such as toast or cereal.  6 hours before the procedure - stop drinking milk  or drinks that contain milk.  2 hours before the procedure - stop drinking clear liquids. Medicines  Ask your health care provider about: ? Changing or stopping your regular medicines. This is especially important if you are taking diabetes medicines or blood thinners. ? Taking medicines such as aspirin and ibuprofen. These medicines can thin your blood. Do not take these medicines before your procedure if your health care provider instructs you not to.  You may be given antibiotic medicine to help prevent infection. General  instructions  Do not put on any lotion, deodorant, or perfume.  Remove contact lenses and jewelry.  Ask your health care provider how your surgical site will be marked or identified.  You may have an exam or testing.  You may have a blood or urine sample taken.  Plan to have someone take you home from the hospital or clinic.  If you will be going home right after the procedure, plan to have someone with you for 24 hours. What happens during the procedure?  To reduce your risk of infection: ? Your health care team will wash or sanitize their hands. ? Your skin will be washed with soap.  An IV tube will be inserted into one of your veins.  You may be given one or more of the following: ? A medicine to help you relax (sedative). ? A medicine to numb the area (local anesthetic). ? A medicine to make you fall asleep (general anesthetic). ? A medicine that is injected into your spine to numb the area below and slightly above the injection site (spinal anesthetic). ? A medicine that is injected into an area of your body to numb everything below the injection site (regional anesthetic).  A lubricated instrument (speculum) will be inserted into your vagina. The speculum will be widened to open the walls of your vagina so your surgeon can see your cervix.  Your cervix will be grasped and tightly stitched closed (sutured). To do this, your surgeon will stitch a strong band of thread around your cervix, then the thread will be tightened to hold your cervix shut. The procedure may vary among health care providers and hospitals. What happens after the procedure?  Your blood pressure, heart rate, breathing rate, and blood oxygen level will be monitored until the medicines you were given have worn off. You will be monitored for premature contractions.  You may have light bleeding and mild cramping.  You may have to wear compression stockings. These stockings help to prevent blood clots and  reduce swelling in your legs.  Do not drive for 24 hours if you received a sedative.  You may be put on bed rest.  You may be given medicine to prevent infection.  You may be given an injection of a hormone (progesterone) to prevent your uterus from tightening (contracting). Summary  Cervical cerclage is a surgical procedure that involves using stitches to sew the cervix shut during pregnancy.  Your blood pressure, heart rate, breathing rate, and blood oxygen level will be monitored until the medicines you were given have worn off. You will be monitored for premature contractions.  You may need to be on bed rest after the procedure.  Plan to have someone take you home from the hospital or clinic. This information is not intended to replace advice given to you by your health care provider. Make sure you discuss any questions you have with your health care provider. Document Released: 10/26/2008 Document Revised: 07/07/2016 Document Reviewed: 06/29/2016 Elsevier  You have shortness of breath.   You have a severe headache.   You have severe weakness.  This information is not intended to replace advice given to you by your health care provider. Make sure you discuss any questions you have with your health care provider.  Document Released: 10/06/2004 Document Revised: 11/06/2016 Document Reviewed: 11/06/2016  Elsevier Interactive Patient Education  2019 [employer]

## 2019-04-22 NOTE — Anesthesia Procedure Notes (Signed)

## 2019-04-22 NOTE — H&P (Signed)
Obstetrics Admission History & Physical  04/22/2019 - 8:25 AM Primary OBGYN: [facility]  Chief Complaint: scheduled prophylactic cerclage  History of Present Illness  28 y.o. G6P0040 @ [redacted]w[redacted]d (Dating: 8wk u/s), with the above CC. Pregnancy complicated by: h/o TABs, h/o mid 2nd trimester cx insufficiency and loss.  [name] states that she has no s/s of SAB  Review of Systems: as noted in the History of Present Illness.   PMHx:  Past Medical History:  Diagnosis Date  . Hypertension   . Premature rupture of membranes 11/03/2017   Patient had PROM at 19 weeks.   . STD (female) 2013   Chlamydia   PSHx:  Past Surgical History:  Procedure Laterality Date  . TOOTH EXTRACTION     Medications:  Medications Prior to Admission  Medication Sig Dispense Refill Last Dose  . promethazine (PHENERGAN) 25 MG tablet Take 25 mg by mouth every 6 (six) hours as needed for nausea or vomiting.     . cyclobenzaprine (FLEXERIL) 5 MG tablet Take 1 tablet (5 mg total) by mouth 3 (three) times daily as needed for muscle spasms. (Patient not taking: Reported on 03/24/2019) 30 tablet 0 Not Taking  . Doxylamine-Pyridoxine (DICLEGIS) 10-10 MG TBEC Take 2 tabs at bedtime. If needed, add another tab in the morning. If needed, add another tab in the afternoon, up to 4 tabs/day. (Patient not taking: Reported on 03/27/2019) 100 tablet 5 Not Taking  . naproxen (NAPROSYN) 375 MG tablet Take 1 tablet (375 mg total) by mouth 2 (two) times daily. (Patient not taking: Reported on 03/24/2019) 20 tablet 0 Not Taking     Allergies: has No Known Allergies. OBHx:  OB History  Gravida Para Term Preterm AB Living  6 1 0 0 4 0  SAB TAB Ectopic Multiple Live Births  2 2 0 0 0    # Outcome Date GA Lbr Len/2nd Weight Sex Delivery Anes PTL Lv  6 Current           5 Para 11/03/17 [redacted]w[redacted]d  200 g M Vag-Spont   FD  4 SAB 2014          3 SAB 2013             Birth Comments: System Generated. Please review and update pregnancy  details.  2 TAB           1 TAB                 FHx:  Family History  Problem Relation Age of Onset  . Alcohol abuse Neg Hx   . Arthritis Neg Hx   . Asthma Neg Hx   . Birth defects Neg Hx   . Cancer Neg Hx   . COPD Neg Hx   . Depression Neg Hx   . Diabetes Neg Hx   . Drug abuse Neg Hx   . Early death Neg Hx   . Hearing loss Neg Hx   . Heart disease Neg Hx   . Hyperlipidemia Neg Hx   . Hypertension Neg Hx   . Kidney disease Neg Hx   . Learning disabilities Neg Hx   . Mental illness Neg Hx   . Mental retardation Neg Hx   . Miscarriages / Stillbirths Neg Hx   . Stroke Neg Hx   . Vision loss Neg Hx    Soc Hx:  Social History   Socioeconomic History  . Marital status: Single    Spouse name: Not on  file  . Number of children: Not on file  . Years of education: Not on file  . Highest education level: Not on file  Occupational History  . Not on file  Social Needs  . Financial resource strain: Not hard at all  . Food insecurity:    Worry: Never true    Inability: Never true  . Transportation needs:    Medical: No    Non-medical: Not on file  Tobacco Use  . Smoking status: Former Smoker    Packs/day: 1.00    Types: Cigars    Last attempt to quit: 10/20/2012    Years since quitting: 6.5  . Smokeless tobacco: Never Used  Substance and Sexual Activity  . Alcohol use: No    Comment: socially  . Drug use: No    Types: Marijuana    Comment: none per pt 03/24/19  . Sexual activity: Yes    Partners: Male    Birth control/protection: None    Comment: Pregnant   Lifestyle  . Physical activity:    Days per week: Not on file    Minutes per session: Not on file  . Stress: Only a little  Relationships  . Social connections:    Talks on phone: Not on file    Gets together: Not on file    Attends religious service: Not on file    Active member of club or organization: Not on file    Attends meetings of clubs or organizations: Not on file    Relationship status: Not  on file  . Intimate partner violence:    Fear of current or ex partner: Not on file    Emotionally abused: Not on file    Physically abused: Not on file    Forced sexual activity: Not on file  Other Topics Concern  . Not on file  Social History Narrative  . Not on file    Objective    Current Vital Signs 24h Vital Sign Ranges  T 98.4 F (36.9 C) Temp  Avg: 98.4 F (36.9 C)  Min: 98.4 F (36.9 C)  Max: 98.4 F (36.9 C)  BP 114/76 BP  Min: 114/76  Max: 114/76  HR 89 Pulse  Avg: 89  Min: 89  Max: 89  RR 18 Resp  Avg: 18  Min: 18  Max: 18  SaO2     No data recorded       24 Hour I/O Current Shift I/O  Time Ins Outs No intake/output data recorded. No intake/output data recorded.    General: Well nourished, well developed female in no acute distress.  Skin:  Warm and dry.  Cardiovascular: S1, S2 normal, no murmur, rub or gallop, regular rate and rhythm Respiratory:  Clear to auscultation bilateral. Normal respiratory effort Abdomen: soft, nttp nd Neuro/Psych:  Normal mood and affect.   Labs  T&S pending  Recent Labs  Lab 04/22/19 0721  WBC 5.8  HGB 14.1  HCT 40.8  PLT 200  CFFDNA: low risk  Radiology Beside u/s: SLIUP with FHR 140s-150s, subj normal AF  Assessment & Plan   28 y.o. [id number] @ 6550w0d with h/o cx insufficiency for cerclage. Pt doing well R/B of procedure d/w pt and she is amenable to proceeding to OR. Pelvic rest d/w her. Can proceed to OR when ready. No need to wait on T&S from my POV.   Carrie Hampton, Jr. MD Attending Center for [facility](Faculty Practice)

## 2019-04-22 NOTE — Op Note (Signed)
Operative Note   04/22/2019  PRE-OP DIAGNOSIS: Pregnancy at 12/0 weeks. History of cervical insufficiency with 19 week delivery. Desire for prophylactic cerclage   POST-OP DIAGNOSIS: Same   SURGEON: Surgeon(s) and Role:    * Ellsworth Bing, MD - Primary  ASSISTANT: None  PROCEDURE: Prophylactic [place] Cerclage with Mersilene Tape  ANESTHESIA: Spinal   ESTIMATED BLOOD LOSS: 64mL  DRAINS: I/O foley before case 58mL UOP   TOTAL IV FLUIDS: per anesthesia note  SPECIMENS: none  VTE PROPHYLAXIS: SCDs  ANTIBIOTICS: Ancef 2gm  COMPLICATIONS: None  DISPOSITION: PACU - hemodynamically stable.  CONDITION: stable  FINDINGS: Exam under anesthesia revealed small mobile 12wk sized uterus. Cervix closed and long (approx 4cm).   PROCEDURE IN DETAIL:  After informed consent was obtained, the patient was taken to the operating room where anesthesia was obtained without difficulty. The patient was positioned in the dorsal lithotomy position in [place] stirrups. The patient was placed in trendelenburg and weighted speculum and Sims retractor placed. Two ringed forceps were placed on the anterior and posterior lips of the cervix. Starting at 10 o'clock, a circumferential counter clockwise stitch was placed. This was removed because I felt I could get higher. A new Mersilene tape suture was used and the same technique but was placed higher on the cervix with about 3-4cm of cervix from the suture to the external os. The patient tolerated the procedure well. The patient was taken to recovery room in excellent condition.  Will give a dose of Motrin 600mg  x 1 in the PACU and 72 hours course after that.   Cornelia Copa MD Attending Center for [employer] [facility])

## 2019-04-22 NOTE — Progress Notes (Signed)
Bedside u/s: SLIUP, FHR 130s, +FM, subj normal AF  Cornelia Copa MD Attending Center for [facility] (Faculty Practice) 04/22/2019 Time: 1040am

## 2019-04-22 NOTE — Transfer of Care (Signed)
Immediate Anesthesia Transfer of Care Note  Patient: Carrie Hampton  Procedure(s) Performed: CERCLAGE CERVICAL (N/A )  Patient Location: PACU  Anesthesia Type:Spinal  Level of Consciousness: awake, alert  and oriented  Airway & Oxygen Therapy: Patient Spontanous Breathing  Post-op Assessment: Report given to RN and Post -op Vital signs reviewed and stable  Post vital signs: Reviewed and stable  Last Vitals:  Vitals Value Taken Time  BP    Temp    Pulse    Resp    SpO2      Last Pain:  Vitals:   04/22/19 0709  TempSrc: Oral  PainSc: 0-No pain         Complications: No apparent anesthesia complications

## 2019-04-22 NOTE — Progress Notes (Signed)
Pt vomited the ibuprofen given at 1108. MD notified. MD ordered Ibuprofen to be given again. Will continue to monitor

## 2019-04-22 NOTE — Anesthesia Preprocedure Evaluation (Addendum)
Anesthesia Evaluation  Patient identified by MRN, date of birth, ID band Patient awake    Reviewed: Allergy & Precautions, H&P , NPO status , Patient's Chart, lab work & pertinent test results  History of Anesthesia Complications Negative for: history of anesthetic complications  Airway Mallampati: II  TM Distance: >3 FB Neck ROM: full    Dental no notable dental hx.    Pulmonary neg pulmonary ROS, former smoker,    Pulmonary exam normal        Cardiovascular negative cardio ROS Normal cardiovascular exam     Neuro/Psych negative neurological ROS  negative psych ROS   GI/Hepatic negative GI ROS, Neg liver ROS,   Endo/Other  negative endocrine ROS  Renal/GU negative Renal ROS  negative genitourinary   Musculoskeletal   Abdominal   Peds  Hematology negative hematology ROS (+)   Anesthesia Other Findings   Reproductive/Obstetrics (+) Pregnancy                             Anesthesia Physical Anesthesia Plan  ASA: II  Anesthesia Plan: Spinal   Post-op Pain Management:    Induction:   PONV Risk Score and Plan: Ondansetron and Treatment may vary due to age or medical condition  Airway Management Planned:   Additional Equipment:   Intra-op Plan:   Post-operative Plan:   Informed Consent: I have reviewed the patients History and Physical, chart, labs and discussed the procedure including the risks, benefits and alternatives for the proposed anesthesia with the patient or authorized representative who has indicated his/her understanding and acceptance.       Plan Discussed with:   Anesthesia Plan Comments:         Anesthesia Quick Evaluation  

## 2019-04-22 NOTE — Anesthesia Postprocedure Evaluation (Signed)
Anesthesia Post Note  Patient: TACOMA COUSER  Procedure(s) Performed: CERCLAGE CERVICAL (N/A )     Patient location during evaluation: PACU Anesthesia Type: Spinal Level of consciousness: oriented and awake and alert Pain management: pain level controlled Vital Signs Assessment: post-procedure vital signs reviewed and stable Respiratory status: spontaneous breathing, respiratory function stable and nonlabored ventilation Cardiovascular status: blood pressure returned to baseline and stable Postop Assessment: no headache, no backache, no apparent nausea or vomiting and spinal receding Anesthetic complications: no    Last Vitals:  Vitals:   04/22/19 1115 04/22/19 1134  BP: 112/88 102/73  Pulse: (!) 116 73  Resp: 18 18  Temp:  (!) 36.3 C  SpO2: 100% 100%    Last Pain:  Vitals:   04/22/19 1133  TempSrc:   PainSc: 0-No pain   Pain Goal:                   Lucretia Kern

## 2019-04-23 LAB — ABO/RH: ABO/RH(D): O POS

## 2019-04-24 ENCOUNTER — Encounter (HOSPITAL_COMMUNITY): Payer: Self-pay | Admitting: Obstetrics and Gynecology

## 2019-04-30 ENCOUNTER — Ambulatory Visit (INDEPENDENT_AMBULATORY_CARE_PROVIDER_SITE_OTHER): Payer: Medicaid Other | Admitting: Obstetrics & Gynecology

## 2019-04-30 ENCOUNTER — Other Ambulatory Visit: Payer: Self-pay

## 2019-04-30 DIAGNOSIS — O0992 Supervision of high risk pregnancy, unspecified, second trimester: Secondary | ICD-10-CM

## 2019-04-30 DIAGNOSIS — O0991 Supervision of high risk pregnancy, unspecified, first trimester: Secondary | ICD-10-CM

## 2019-04-30 DIAGNOSIS — Z3A13 13 weeks gestation of pregnancy: Secondary | ICD-10-CM

## 2019-04-30 NOTE — Progress Notes (Signed)
   PRENATAL VISIT NOTE  Subjective:  Carrie Hampton is a 28 y.o. G6P0040 at [redacted]w[redacted]d being seen today for ongoing prenatal care.  She is currently monitored for the following issues for this high-risk pregnancy and has Supervision of high-risk pregnancy; History of cervical incompetence; History of pre-eclampsia in prior pregnancy, currently pregnant; Cervical cerclage suture present; and Transient hypertension on their problem list.  Patient reports no complaints.  Contractions: Not present. Vag. Bleeding: None.  Movement: Present. Denies leaking of fluid.   The following portions of the patient's history were reviewed and updated as appropriate: allergies, current medications, past family history, past medical history, past social history, past surgical history and problem list.   Objective:   Vitals:   04/30/19 0952  BP: 108/72  Pulse: 81  Weight: 118 lb 9.6 oz (53.8 kg)    Fetal Status: Fetal Heart Rate (bpm): 150   Movement: Present     General:  Alert, oriented and cooperative. Patient is in no acute distress.  Skin: Skin is warm and dry. No rash noted.   Cardiovascular: Normal heart rate noted  Respiratory: Normal respiratory effort, no problems with respiration noted  Abdomen: Soft, gravid, appropriate for gestational age.  Pain/Pressure: Absent     Pelvic: Cervical exam deferred        Extremities: Normal range of motion.  Edema: None  Mental Status: Normal mood and affect. Normal behavior. Normal judgment and thought content.   Assessment and Plan:  Pregnancy: G6P0040 at [redacted]w[redacted]d 1. Supervision of high risk pregnancy in second trimester Had cerclage 1 week ago - [place] MFM OB DETAIL +14 WK; Future  Preterm labor symptoms and general obstetric precautions including but not limited to vaginal bleeding, contractions, leaking of fluid and fetal movement were reviewed in detail with the patient. Please refer to After Visit Summary for other counseling recommendations.   Return in  about 6 weeks (around 06/11/2019) for virtual.  No future appointments.  Scheryl Darter, MD

## 2019-04-30 NOTE — Progress Notes (Signed)
Patient reports feeling fetal flutter movement, denies pain. Pt states that she stooped taking PNV but she will start again.

## 2019-04-30 NOTE — Patient Instructions (Signed)

## 2019-05-07 ENCOUNTER — Encounter: Payer: Medicaid Other | Admitting: Obstetrics and Gynecology

## 2019-05-22 ENCOUNTER — Inpatient Hospital Stay (HOSPITAL_COMMUNITY)
Admission: AD | Admit: 2019-05-22 | Discharge: 2019-05-22 | Disposition: A | Discharge status: Home / Self Care | Payer: Medicaid Other | Attending: Obstetrics and Gynecology | Admitting: Obstetrics and Gynecology

## 2019-05-22 ENCOUNTER — Encounter (HOSPITAL_COMMUNITY): Payer: Self-pay | Admitting: *Deleted

## 2019-05-22 ENCOUNTER — Other Ambulatory Visit: Payer: Self-pay

## 2019-05-22 DIAGNOSIS — B3731 Acute candidiasis of vulva and vagina: Secondary | ICD-10-CM

## 2019-05-22 DIAGNOSIS — B373 Candidiasis of vulva and vagina: Secondary | ICD-10-CM | POA: Diagnosis not present

## 2019-05-22 DIAGNOSIS — N898 Other specified noninflammatory disorders of vagina: Secondary | ICD-10-CM | POA: Diagnosis present

## 2019-05-22 DIAGNOSIS — O98812 Other maternal infectious and parasitic diseases complicating pregnancy, second trimester: Secondary | ICD-10-CM | POA: Diagnosis not present

## 2019-05-22 DIAGNOSIS — O09292 Supervision of pregnancy with other poor reproductive or obstetric history, second trimester: Secondary | ICD-10-CM | POA: Diagnosis not present

## 2019-05-22 DIAGNOSIS — Z87891 Personal history of nicotine dependence: Secondary | ICD-10-CM | POA: Insufficient documentation

## 2019-05-22 DIAGNOSIS — O3431 Maternal care for cervical incompetence, first trimester: Secondary | ICD-10-CM | POA: Diagnosis not present

## 2019-05-22 DIAGNOSIS — Z3A16 16 weeks gestation of pregnancy: Secondary | ICD-10-CM | POA: Diagnosis not present

## 2019-05-22 DIAGNOSIS — O09299 Supervision of pregnancy with other poor reproductive or obstetric history, unspecified trimester: Secondary | ICD-10-CM

## 2019-05-22 LAB — URINALYSIS, ROUTINE W REFLEX MICROSCOPIC
Bilirubin Urine: NEGATIVE
Glucose, UA: NEGATIVE mg/dL
Ketones, ur: NEGATIVE mg/dL
Nitrite: NEGATIVE
Protein, ur: 100 mg/dL — AB
Specific Gravity, Urine: 1.025 (ref 1.005–1.030)
WBC, UA: >50 WBC/hpf — ABNORMAL HIGH (ref 0–5)
pH: 5 (ref 5.0–8.0)

## 2019-05-22 LAB — WET PREP, GENITAL
Clue Cells Wet Prep HPF POC: NONE SEEN
Sperm: NONE SEEN
Trich, Wet Prep: NONE SEEN

## 2019-05-22 MED ORDER — TERCONAZOLE 0.4 % VA CREA: 1.0000 | TOPICAL_CREAM | Freq: Every day | VAGINAL | 0 refills | Status: DC

## 2019-05-22 MED ORDER — MONSELS FERRIC SUBSULFATE EX SOLN
Freq: Once | CUTANEOUS | Status: DC
  Filled 2019-05-22: qty 8

## 2019-05-22 MED ORDER — SILVER NITRATE-POT NITRATE 75-25 % EX MISC: 1.0000 | CUTANEOUS | Status: DC | PRN

## 2019-05-22 NOTE — MAU Provider Note (Signed)
History     CSN: [id number]  Arrival date and time: 05/22/19 1121   First Provider Initiated Contact with Patient 05/22/19 1248      Chief Complaint  Patient presents with  . Vaginal Discharge   Carrie Hampton is a 28 y.o. G6P0040 at 5391w2d who receives care at [facility].  She presents today for Vaginal Discharge.  She states she has been having the discharge for about one week, but she noticed an increase this morning.  She reports that "I could feel it coming out into my underwear."  Patient reports sexual activity yesterday and further states she was told "it was okay after 2-4 weeks" post cerclage placement.  She reports the discharge is "off-white, yellow, brown, tannish color."  She reports a smell that is "like a smell I never smelled before."  She denies itching and burning, but endorses some irritation that is increased with wearing underwear.  Patient endorses a history of STDs, but has never had a yeast infection.      OB History    Gravida  6   Para  1   Term  0   Preterm  0   AB  4   Living  0     SAB  2   TAB  2   Ectopic  0   Multiple  0   Live Births  0           Past Medical History:  Diagnosis Date  . Hypertension   . Premature rupture of membranes 11/03/2017   Patient had PROM at 19 weeks.   . STD (female) 2013   Chlamydia    Past Surgical History:  Procedure Laterality Date  . CERVICAL CERCLAGE N/A 04/22/2019   Procedure: CERCLAGE CERVICAL;  Surgeon: Lemitar BingPickens, Charlie, MD;  Location: [facility] LD ORS;  Service: Gynecology;  Laterality: N/A;  . TOOTH EXTRACTION      Family History  Problem Relation Age of Onset  . Alcohol abuse Neg Hx   . Arthritis Neg Hx   . Asthma Neg Hx   . Birth defects Neg Hx   . Cancer Neg Hx   . COPD Neg Hx   . Depression Neg Hx   . Diabetes Neg Hx   . Drug abuse Neg Hx   . Early death Neg Hx   . Hearing loss Neg Hx   . Heart disease Neg Hx   . Hyperlipidemia Neg Hx   . Hypertension Neg Hx   . Kidney  disease Neg Hx   . Learning disabilities Neg Hx   . Mental illness Neg Hx   . Mental retardation Neg Hx   . Miscarriages / Stillbirths Neg Hx   . Stroke Neg Hx   . Vision loss Neg Hx     Social History   Tobacco Use  . Smoking status: Former Smoker    Packs/day: 1.00    Types: Cigars    Quit date: 10/20/2012    Years since quitting: 6.5  . Smokeless tobacco: Never Used  Substance Use Topics  . Alcohol use: No    Comment: socially  . Drug use: No    Types: Marijuana    Comment: none per pt 03/24/19    Allergies: No Known Allergies  Medications Prior to Admission  Medication Sig Dispense Refill Last Dose  . cyclobenzaprine (FLEXERIL) 5 MG tablet Take 1 tablet (5 mg total) by mouth 3 (three) times daily as needed for muscle spasms. (Patient not taking: Reported on  03/24/2019) 30 tablet 0   . Doxylamine-Pyridoxine (DICLEGIS) 10-10 MG TBEC Take 2 tabs at bedtime. If needed, add another tab in the morning. If needed, add another tab in the afternoon, up to 4 tabs/day. (Patient not taking: Reported on 03/27/2019) 100 tablet 5   . promethazine (PHENERGAN) 25 MG tablet Take 25 mg by mouth every 6 (six) hours as needed for nausea or vomiting.       Review of Systems  Constitutional: Negative for chills and fever.  Eyes: Negative for visual disturbance.  Respiratory: Negative for cough and shortness of breath.   Gastrointestinal: Positive for constipation. Negative for abdominal pain, diarrhea, nausea and vomiting.  Genitourinary: Positive for vaginal discharge. Negative for difficulty urinating, dyspareunia, dysuria, vaginal bleeding and vaginal pain.  Neurological: Negative for dizziness, light-headedness and headaches.   Physical Exam   Blood pressure 106/86, pulse 99, temperature 98.2 F (36.8 C), resp. rate 16, last menstrual period 01/06/2019, unknown if currently breastfeeding.  Physical Exam  Constitutional: She is oriented to person, place, and time. She appears  well-developed and well-nourished.  HENT:  Head: Normocephalic and atraumatic.  Eyes: Conjunctivae are normal.  Neck: Normal range of motion.  Cardiovascular: Normal rate.  Respiratory: Effort normal.  GI: Soft.  Genitourinary: Cervix exhibits no motion tenderness and no discharge.    Vaginal discharge present.     Genitourinary Comments: Speculum Exam: -Vaginal Vault: Pink Mucosa.  Small amt thin curdy discharge in vault-wet prep collected -Anterior aspect of cervix with scant bleeding at area central to vesicocervical reflection: ~1cm. -Cervix:Pink, no lesions, cysts, or polyps.  Appears closed. No active bleeding from os-GC/CT collected -Bimanual Exam: Closed per Dr. Alysia PennaErvin with sutures intact   Musculoskeletal: Normal range of motion.  Neurological: She is alert and oriented to person, place, and time.  Skin: Skin is warm and dry.  Psychiatric: She has a normal mood and affect. Her behavior is normal.    MAU Course  Procedures  Results for orders placed or performed during the hospital encounter of 05/22/19 (from the past 24 hour(s))  Urinalysis, Routine w reflex microscopic     Status: Abnormal   Collection Time: 05/22/19 12:44 PM  Result Value Ref Range   Color, Urine YELLOW YELLOW   APPearance CLOUDY (A) CLEAR   Specific Gravity, Urine 1.025 1.005 - 1.030   pH 5.0 5.0 - 8.0   Glucose, UA NEGATIVE NEGATIVE mg/dL   Hgb urine dipstick MODERATE (A) NEGATIVE   Bilirubin Urine NEGATIVE NEGATIVE   Ketones, ur NEGATIVE NEGATIVE mg/dL   Protein, ur 161100 (A) NEGATIVE mg/dL   Nitrite NEGATIVE NEGATIVE   Leukocytes,Ua MODERATE (A) NEGATIVE   RBC / HPF 11-20 0 - 5 RBC/hpf   WBC, UA >50 (H) 0 - 5 WBC/hpf   Bacteria, UA FEW (A) NONE SEEN   Squamous Epithelial / LPF 21-50 0 - 5   Mucus PRESENT   Wet prep, genital     Status: Abnormal   Collection Time: 05/22/19  1:03 PM   Specimen: Thin Prep Cervical/Endocervical  Result Value Ref Range   Yeast Wet Prep HPF POC PRESENT (A) NONE  SEEN   Trich, Wet Prep NONE SEEN NONE SEEN   Clue Cells Wet Prep HPF POC NONE SEEN NONE SEEN   WBC, Wet Prep HPF POC MANY (A) NONE SEEN   Sperm NONE SEEN     MDM Pelvic Exam with cultures Labs: UA, Wet prep, and GC/CT Assessment and Plan  28 year old  G6P0040 at 6916.2  weeks Vaginal Discharge  -Exam findings discussed. -Informed that findings reflective of yeast, but would await results in case other treatments necessary. -Cultures collected and sent. -Dr. Rip Harbour called to bedside for bleeding at cerclage site. -Reports normal variation dependent upon surgical procedure. -Patient questions and concerns addressed. -Instructed to discontinue sexual activity.  -Will await results.  Follow Up (2:26 PM) Candidiasis of Vagina  -Wet prep returns significant for yeast -Results discussed with patient -Rx for Terazol 7 sent into pharmacy of patient's request.  -Discussed placement of medication into vagina nightly. -Reiterated abstaining from sexual activity at current. -No questions or concerns. -Keep appt as scheduled: July 15 -Encouraged to call or return to MAU if symptoms worsen or with the onset of new symptoms. -Discharged to home in stable condition  Maryann Conners MSN, CNM 05/22/2019, 12:48 PM

## 2019-05-22 NOTE — Discharge Instructions (Signed)
Activity Restriction During Pregnancy °Your health care provider may recommend specific activity restrictions during pregnancy for a variety of reasons. Activity restriction may require that you limit activities that require great effort, such as exercise, lifting, or sex. °The type of activity restriction will vary for each person, depending on your risk or the problems you are having. Activity restriction may be recommended for a period of time until your baby is delivered. °Why are activity restrictions recommended? °Activity restriction may be recommended if: °· Your placenta is partially or completely covering the opening of your cervix (placenta previa). °· There is bleeding between the wall of the uterus and the amniotic sac in the first trimester of pregnancy (subchorionic hemorrhage). °· You went into labor too early (preterm labor). °· You have a history of miscarriage. °· You have a condition that causes high blood pressure during pregnancy (preeclampsia or eclampsia). °· You are pregnant with more than one baby. °· Your baby is not growing well. °What are the risks? °The risks depend on your specific restriction. Strict bed rest has the most physical and emotional risks and is no longer routinely recommended. Risks of strict bed rest include: °· Loss of muscle conditioning from not moving. °· Blood clots. °· Social isolation. °· Depression. °· Loss of income. °Talk with your health care team about activity restriction to decide if it is best for you and your baby. Even if you are having problems during your pregnancy, you may be able to continue with normal levels of activity with careful monitoring by your health care team. °Follow these instructions at home: °If needed, based on your overall health and the health of your baby, your health care provider will decide which type of activity restriction is right for you. Activity restrictions may include: °· Not lifting anything heavier than 10 pounds (4.5  kg). °· Avoiding activities that take a lot of physical effort. °· No lifting or straining. °· Resting in a sitting position or lying down for periods of time during the day. °Pelvic rest may be recommended along with activity restrictions. If pelvic rest is recommended, then: °· Do not have sex, an orgasm, or use sexual stimulation. °· Do not use tampons. Do not douche. Do not put anything into your vagina. °· Do not lift anything that is heavier than 10 lb (4.5 kg). °· Avoid activities that require a lot of effort. °· Avoid any activity in which your pelvic muscles could become strained, such as squatting. °Questions to ask your health care provider °· Why is my activity being limited? °· How will activity restrictions affect my body? °· Why is rest helpful for me and my baby? °· What activities can I do? °· When can I return to normal activities? °When should I seek immediate medical care? °Seek immediate medical care if you have: °· Vaginal bleeding. °· Vaginal discharge. °· Cramping pain in your lower abdomen. °· Regular contractions. °· A low, dull backache. °Summary °· Your health care provider may recommend specific activity restrictions during pregnancy for a variety of reasons. °· Activity restriction may require that you limit activities such as exercise, lifting, sex, or any other activity that requires great effort. °· Discuss the risks and benefits of activity restriction with your health care team to decide if it is best for you and your baby. °· Contact your health care provider right away if you think you are having contractions, or if you notice vaginal bleeding, discharge, or cramping. °This information is not   intended to replace advice given to you by your health care provider. Make sure you discuss any questions you have with your health care provider. °Document Released: 03/10/2011 Document Revised: 03/05/2018 Document Reviewed: 03/05/2018 °Elsevier Interactive Patient Education © 2019 [employer] ° °

## 2019-05-22 NOTE — Progress Notes (Signed)
Pt states for approx 1 week she has been having an increase in vaginal discharge of varying color and today when she stood up she could feel herself leaking. This fluid today was off "white/yellowish/brownish/thin".

## 2019-05-23 LAB — GC/CHLAMYDIA PROBE AMP (~~LOC~~) NOT AT ARMC
Chlamydia: NEGATIVE
Neisseria Gonorrhea: NEGATIVE

## 2019-05-27 ENCOUNTER — Encounter (HOSPITAL_COMMUNITY): Payer: Self-pay

## 2019-05-27 ENCOUNTER — Other Ambulatory Visit: Payer: Self-pay

## 2019-05-27 ENCOUNTER — Inpatient Hospital Stay (HOSPITAL_COMMUNITY)
Admission: AD | Admit: 2019-05-27 | Discharge: 2019-05-27 | Disposition: A | Discharge status: Home / Self Care | Payer: Medicaid Other | Source: Ambulatory Visit | Attending: Family Medicine | Admitting: Family Medicine

## 2019-05-27 DIAGNOSIS — N888 Other specified noninflammatory disorders of cervix uteri: Secondary | ICD-10-CM | POA: Diagnosis not present

## 2019-05-27 DIAGNOSIS — O3432 Maternal care for cervical incompetence, second trimester: Secondary | ICD-10-CM | POA: Diagnosis not present

## 2019-05-27 DIAGNOSIS — O209 Hemorrhage in early pregnancy, unspecified: Secondary | ICD-10-CM | POA: Insufficient documentation

## 2019-05-27 DIAGNOSIS — Z3A17 17 weeks gestation of pregnancy: Secondary | ICD-10-CM | POA: Insufficient documentation

## 2019-05-27 DIAGNOSIS — Z87891 Personal history of nicotine dependence: Secondary | ICD-10-CM | POA: Diagnosis not present

## 2019-05-27 MED ORDER — DOCUSATE SODIUM 100 MG PO CAPS: 100.0000 mg | ORAL_CAPSULE | Freq: Two times a day (BID) | ORAL | 2 refills | Status: DC | PRN

## 2019-05-27 NOTE — Discharge Instructions (Signed)
Activity Restriction During Pregnancy °Your health care provider may recommend specific activity restrictions during pregnancy for a variety of reasons. Activity restriction may require that you limit activities that require great effort, such as exercise, lifting, or sex. °The type of activity restriction will vary for each person, depending on your risk or the problems you are having. Activity restriction may be recommended for a period of time until your baby is delivered. °Why are activity restrictions recommended? °Activity restriction may be recommended if: °· Your placenta is partially or completely covering the opening of your cervix (placenta previa). °· There is bleeding between the wall of the uterus and the amniotic sac in the first trimester of pregnancy (subchorionic hemorrhage). °· You went into labor too early (preterm labor). °· You have a history of miscarriage. °· You have a condition that causes high blood pressure during pregnancy (preeclampsia or eclampsia). °· You are pregnant with more than one baby. °· Your baby is not growing well. °What are the risks? °The risks depend on your specific restriction. Strict bed rest has the most physical and emotional risks and is no longer routinely recommended. Risks of strict bed rest include: °· Loss of muscle conditioning from not moving. °· Blood clots. °· Social isolation. °· Depression. °· Loss of income. °Talk with your health care team about activity restriction to decide if it is best for you and your baby. Even if you are having problems during your pregnancy, you may be able to continue with normal levels of activity with careful monitoring by your health care team. °Follow these instructions at home: °If needed, based on your overall health and the health of your baby, your health care provider will decide which type of activity restriction is right for you. Activity restrictions may include: °· Not lifting anything heavier than 10 pounds (4.5  kg). °· Avoiding activities that take a lot of physical effort. °· No lifting or straining. °· Resting in a sitting position or lying down for periods of time during the day. °Pelvic rest may be recommended along with activity restrictions. If pelvic rest is recommended, then: °· Do not have sex, an orgasm, or use sexual stimulation. °· Do not use tampons. Do not douche. Do not put anything into your vagina. °· Do not lift anything that is heavier than 10 lb (4.5 kg). °· Avoid activities that require a lot of effort. °· Avoid any activity in which your pelvic muscles could become strained, such as squatting. °Questions to ask your health care provider °· Why is my activity being limited? °· How will activity restrictions affect my body? °· Why is rest helpful for me and my baby? °· What activities can I do? °· When can I return to normal activities? °When should I seek immediate medical care? °Seek immediate medical care if you have: °· Vaginal bleeding. °· Vaginal discharge. °· Cramping pain in your lower abdomen. °· Regular contractions. °· A low, dull backache. °Summary °· Your health care provider may recommend specific activity restrictions during pregnancy for a variety of reasons. °· Activity restriction may require that you limit activities such as exercise, lifting, sex, or any other activity that requires great effort. °· Discuss the risks and benefits of activity restriction with your health care team to decide if it is best for you and your baby. °· Contact your health care provider right away if you think you are having contractions, or if you notice vaginal bleeding, discharge, or cramping. °This information is not   intended to replace advice given to you by your health care provider. Make sure you discuss any questions you have with your health care provider. Document Released: 03/10/2011 Document Revised: 03/05/2018 Document Reviewed: 03/05/2018 Elsevier Patient Education  [place].   Cervical Insufficiency Cervical insufficiency is when the cervix is weak and starts to open (dilate) and thin (efface) before the pregnancy is at term and before labor starts. This is also called incompetent cervix. It can happen during the second or third trimester when the fetus starts putting pressure on the cervix. Treatment may reduce the risk of problems for you and your baby. Cervical insufficiency can lead to:  Loss of the baby (miscarriage).  Breaking of the sac that holds the baby (amniotic sac). This is also called preterm premature rupture of the membranes,PPROM.  The baby being born early (preterm birth). What are the causes? The cause of this condition is not well known. However, it may be caused by abnormalities in the cervix and other factors such as inflammation or infection. What increases the risk? This condition is more likely to develop if:  You have a shorter cervix than normal.  Your cervix was damaged or injured during a past pregnancy or surgery.  You were born with a cervical defect.  You have had a procedure done on the cervix, such as cervical biopsy.  You have a history of: ? Cervical insufficiency. ? PPROM.  You have ended several pregnancies through abortion.  You were exposed to the drug diethylstilbestrol (DES). What are the signs or symptoms? Symptoms of this condition can vary. Sometimes there no symptoms for this condition, and at other times there are mild symptoms that start between weeks 14 and 20 of pregnancy. The symptoms may last several days or weeks. These symptoms include:  Light spotting or bleeding from the vagina.  Pelvic pressure.  A change in vaginal discharge, such as changes from clear, white, or light yellow to pink or tan.  Back pain.  Abdominal pain or cramping. How is this diagnosed? This condition may be diagnosed based on:  Your symptoms.  Your medical history, including: ? Any problems during past  pregnancies, such as miscarriages. ? Any procedures performed on your cervix. ? Any history of cervical insufficiency. During the second trimester, cervical insufficiency may be diagnosed based on:  An ultrasound done with a probe inserted into your vagina (transvaginal ultrasound).  A pelvic exam.  Tests of fluid in the amniotic sac. This is done to rule out infection. How is this treated? This condition may be managed by:  Limiting physical activity.  Limiting activity at home or in the hospital.  Pelvic rest. This means that there should be no sexual intercourse or placing anything in the vagina.  A procedure to sew the cervix closed and prevent it from opening too early (cerclage). The stitches (sutures) are removed between weeks 36 and 38 to avoid problems during labor. Cerclage may be recommended if: ? You have a history of miscarriages or preterm births without a known cause. ? You have a short cervix. A short cervix is identified by ultrasound. ? Your cervix has dilated before 24 weeks of pregnancy. Follow these instructions at home:  Get plenty of rest and lessen activity as told by your health care provider. Ask your health care provider what activities are safe for you.  If pelvic rest was recommended, you shouldnot have sex, use tampons, douche, or place anything inside your vagina until your health care  provider says that this is okay.  Take over-the-counter and prescription medicines only as told by your health care provider.  Keep all follow-up visits and prenatal visits as told by your health care provider. This is important. Get help right away if:  You have vaginal bleeding, even if it is a small amount or even if it is painless.  You have pain in your abdomen or your lower back.  You have a feeling of increased pressure in your pelvis.  You have vaginal discharge that changes from clear, [place], or light yellow to pink or tan.  You have a fever.  You have  severe nausea or vomiting. Summary  Cervical insufficiency is when the cervix is weak and starts to dilate and efface before the pregnancy is at term and before labor starts.  Symptoms of this condition can vary from no symptoms to mild symptoms that start between weeks 14 and 20 of pregnancy. The symptoms may last several days or weeks.  This condition may be managed by limiting physical activity, having pelvic rest, or having cervical cerclage.  If pelvic rest was recommended, you should not have sex, use tampons, use a douche, or place anything inside your vagina until your health care provider says that this is okay. This information is not intended to replace advice given to you by your health care provider. Make sure you discuss any questions you have with your health care provider. Document Released: 11/13/2005 Document Revised: 10/26/2017 Document Reviewed: 11/16/2016 Elsevier Patient Education  2020 [employer].

## 2019-05-27 NOTE — MAU Provider Note (Addendum)
Chief Complaint:  Abdominal Pain and Vaginal Bleeding   First Provider Initiated Contact with Patient 05/27/19 2036      HPI: Carrie Hampton is a 28 y.o. G6P0040 at 5117w0dwho presents via EMS to maternity admissions reporting light bleeding at home when she tried to have a bowel movement. Was pink on tissue.  Had some pelvic cramping at home but none now.  Was seen for this on 05/22/19 and bleeding felt to be related to cerclage.  . She denies LOF, vaginal itching/burning, urinary symptoms, h/a, dizziness, n/v, diarrhea, constipation or fever/chills.    RN Note: Pt arrived EMS c/o abdominal cramping which she's had about a week. Tonight she noticed some bleeding when she was wiping in the bathroom. Denies LOF. Has hx of preterm labor and delivery. Cerclage placed 5/26.  Is still having vaginal discharge that she was seen for on 6/25- hasn't been able to get her medication.   Past Medical History: Past Medical History:  Diagnosis Date  . Hypertension   . Premature rupture of membranes 11/03/2017   Patient had PROM at 19 weeks.   . STD (female) 2013   Chlamydia    Past obstetric history: OB History  Gravida Para Term Preterm AB Living  6 1 0 0 4 0  SAB TAB Ectopic Multiple Live Births  2 2 0 0 0    # Outcome Date GA Lbr Len/2nd Weight Sex Delivery Anes PTL Lv  6 Current           5 Para 11/03/17 [id number]  200 g M Vag-Spont   FD  4 SAB 2014          3 SAB 2013             Birth Comments: System Generated. Please review and update pregnancy details.  2 TAB           1 TAB             Past Surgical History: Past Surgical History:  Procedure Laterality Date  . CERVICAL CERCLAGE N/A 04/22/2019   Procedure: CERCLAGE CERVICAL;  Surgeon: [place] BingPickens, Charlie, MD;  Location: [facility] LD ORS;  Service: Gynecology;  Laterality: N/A;  . TOOTH EXTRACTION      Family History: Family History  Problem Relation Age of Onset  . Alcohol abuse Neg Hx   . Arthritis Neg Hx   . Asthma Neg Hx   . Birth  defects Neg Hx   . Cancer Neg Hx   . COPD Neg Hx   . Depression Neg Hx   . Diabetes Neg Hx   . Drug abuse Neg Hx   . Early death Neg Hx   . Hearing loss Neg Hx   . Heart disease Neg Hx   . Hyperlipidemia Neg Hx   . Hypertension Neg Hx   . Kidney disease Neg Hx   . Learning disabilities Neg Hx   . Mental illness Neg Hx   . Mental retardation Neg Hx   . Miscarriages / Stillbirths Neg Hx   . Stroke Neg Hx   . Vision loss Neg Hx     Social History: Social History   Tobacco Use  . Smoking status: Former Smoker    Packs/day: 1.00    Types: Cigars    Quit date: 10/20/2012    Years since quitting: 6.6  . Smokeless tobacco: Never Used  Substance Use Topics  . Alcohol use: No    Comment: socially  . Drug use: No  Types: Marijuana    Comment: none per pt 03/24/19    Allergies: No Known Allergies  Meds:  Medications Prior to Admission  Medication Sig Dispense Refill Last Dose  . promethazine (PHENERGAN) 25 MG tablet Take 25 mg by mouth every 6 (six) hours as needed for nausea or vomiting.     Marland Kitchen terconazole (TERAZOL 7) 0.4 % vaginal cream Place 1 applicator vaginally at bedtime. 45 g 0     I have reviewed patient's Past Medical Hx, Surgical Hx, Family Hx, Social Hx, medications and allergies.   ROS:  Review of Systems  Constitutional: Negative for chills and fever.  Respiratory: Negative for shortness of breath.   Gastrointestinal: Positive for constipation. Negative for abdominal pain, diarrhea and nausea.  Genitourinary: Positive for vaginal bleeding. Negative for dysuria and pelvic pain (Has some intermittently, none now).  Musculoskeletal: Negative for back pain.   Other systems negative  Physical Exam   Patient Vitals for the past 24 hrs:  BP Temp Temp src Pulse Resp SpO2  05/27/19 2118 103/71 - - - - -  05/27/19 2024 106/70 98.6 F (37 C) Oral 96 16 100 %   Constitutional: Well-developed, well-nourished female in no acute distress.  Cardiovascular:  normal rate and rhythm Respiratory: normal effort, clear to auscultation bilaterally GI: Abd soft, non-tender, gravid appropriate for gestational age.   No rebound or guarding. MS: Extremities nontender, no edema, normal ROM Neurologic: Alert and oriented x 4.  GU: Neg CVAT.  PELVIC EXAM: Cervix pink, visually closed, without lesion, scant red discharge visible on anterior cervix.  Swabbed and there was no persistent bleeding.    Cervix long and closed.  It is firm.  Cerclage is visible and palpable all around.  Seems to indent the anterior surface of cervix.    FHT:  156   Labs: No results found for this or any previous visit (from the past 24 hour(s)).  --/--/O POS, O POS (05/26 0721)  Imaging:  No results found.  MAU Course/MDM: Discussed findings with patient I think the bleeding is coming from the pressure of the cerclage on the cervix tissue Message sent to Dr Ilda Basset to see if he wants to examine her himself.   Her next visit in office is virtual.  She does have an [place] appt coming up.   Assessment: 1. Bleeding of cervix   2. Cervical cerclage suture present in second trimester     Plan: Discharge home Preterm Labor precautions and fetal kick counts Rx Colace for constipation  Follow up in Office for prenatal visits and recheck of cervix.  Follow-up Information    [facility]. Schedule an appointment as soon as possible for a visit.   Specialty: Obstetrics and Gynecology Contact information: [address], [place] [id number]         Encouraged to return here or to other Urgent Care/ED if she develops worsening of symptoms, increase in pain, fever, or other concerning symptoms.  Pt stable at time of discharge.  Hansel Feinstein CNM, MSN Certified Nurse-Midwife 05/27/2019 9:45 PM

## 2019-05-27 NOTE — MAU Note (Addendum)
Pt arrived EMS c/o abdominal cramping which she's had about a week. Tonight she noticed some bleeding when she was wiping in the bathroom. Denies LOF. Has hx of preterm labor and delivery. Cerclage placed 5/26.  Is still having vaginal discharge that she was seen for on 6/25- hasn't been able to get her medication.

## 2019-06-10 ENCOUNTER — Other Ambulatory Visit: Payer: Self-pay

## 2019-06-10 ENCOUNTER — Ambulatory Visit (HOSPITAL_COMMUNITY): Payer: Medicaid Other | Admitting: *Deleted

## 2019-06-10 ENCOUNTER — Other Ambulatory Visit (HOSPITAL_COMMUNITY): Payer: Self-pay | Admitting: *Deleted

## 2019-06-10 ENCOUNTER — Encounter (HOSPITAL_COMMUNITY): Payer: Self-pay

## 2019-06-10 ENCOUNTER — Ambulatory Visit (HOSPITAL_COMMUNITY)
Admission: RE | Admit: 2019-06-10 | Discharge: 2019-06-10 | Disposition: A | Discharge status: Home / Self Care | Payer: Medicaid Other | Source: Ambulatory Visit | Attending: Obstetrics and Gynecology | Admitting: Obstetrics and Gynecology

## 2019-06-10 VITALS — BP 109/68 | HR 84 | Temp 98.4°F

## 2019-06-10 DIAGNOSIS — O09292 Supervision of pregnancy with other poor reproductive or obstetric history, second trimester: Secondary | ICD-10-CM

## 2019-06-10 DIAGNOSIS — O10919 Unspecified pre-existing hypertension complicating pregnancy, unspecified trimester: Secondary | ICD-10-CM

## 2019-06-10 DIAGNOSIS — O3432 Maternal care for cervical incompetence, second trimester: Secondary | ICD-10-CM

## 2019-06-10 DIAGNOSIS — Z3A19 19 weeks gestation of pregnancy: Secondary | ICD-10-CM

## 2019-06-10 DIAGNOSIS — O09212 Supervision of pregnancy with history of pre-term labor, second trimester: Secondary | ICD-10-CM | POA: Diagnosis not present

## 2019-06-10 DIAGNOSIS — O0992 Supervision of high risk pregnancy, unspecified, second trimester: Secondary | ICD-10-CM

## 2019-06-10 DIAGNOSIS — O43199 Other malformation of placenta, unspecified trimester: Secondary | ICD-10-CM

## 2019-06-11 ENCOUNTER — Ambulatory Visit (INDEPENDENT_AMBULATORY_CARE_PROVIDER_SITE_OTHER): Payer: Medicaid Other | Admitting: Obstetrics & Gynecology

## 2019-06-11 DIAGNOSIS — O3432 Maternal care for cervical incompetence, second trimester: Secondary | ICD-10-CM | POA: Diagnosis not present

## 2019-06-11 DIAGNOSIS — O0990 Supervision of high risk pregnancy, unspecified, unspecified trimester: Secondary | ICD-10-CM

## 2019-06-11 DIAGNOSIS — O0992 Supervision of high risk pregnancy, unspecified, second trimester: Secondary | ICD-10-CM

## 2019-06-11 MED ORDER — BLOOD PRESSURE MONITORING KIT: 1.0000 | PACK | 0 refills | Status: DC

## 2019-06-11 NOTE — Patient Instructions (Signed)

## 2019-06-11 NOTE — Progress Notes (Signed)
S/w pt for webex visit. Pt reports fetal movement, denies pain. Pt states that she does not have a BP cuff at home, faxed rx.

## 2019-06-11 NOTE — Progress Notes (Signed)
   TELEHEALTH VIRTUAL OBSTETRICS VISIT ENCOUNTER NOTE  I connected with Carrie Hampton on 06/11/19 at 10:00 AM EDT by telephone at home and verified that I am speaking with the correct person using two identifiers.   I discussed the limitations, risks, security and privacy concerns of performing an evaluation and management service by telephone and the availability of in person appointments. I also discussed with the patient that there may be a patient responsible charge related to this service. The patient expressed understanding and agreed to proceed.  Subjective:  Carrie Hampton is a 28 y.o. G6P0040 at 75w1dbeing followed for ongoing prenatal care.  She is currently monitored for the following issues for this high-risk pregnancy and has Supervision of high-risk pregnancy; History of cervical incompetence; History of pre-eclampsia in prior pregnancy, currently pregnant; Cervical cerclage suture present; and Transient hypertension on their problem list.  Patient reports no complaints. Reports fetal movement. Denies any contractions, bleeding or leaking of fluid.   The following portions of the patient's history were reviewed and updated as appropriate: allergies, current medications, past family history, past medical history, past social history, past surgical history and problem list.   Objective:   General:  Alert, oriented and cooperative.   Mental Status: Normal mood and affect perceived. Normal judgment and thought content.  Rest of physical exam deferred due to type of encounter  Assessment and Plan:  Pregnancy: G6P0040 at 125w1d. Supervision of high risk pregnancy, antepartum Need to start BP monitoring at home - Blood Pressure Monitoring KIT; 1 kit by Does not apply route once a week.  Dispense: 1 kit; Refill: 0  2. Cervical cerclage suture present in second trimester Doing well, nl cx length yesterday  Preterm labor symptoms and general obstetric precautions including but  not limited to vaginal bleeding, contractions, leaking of fluid and fetal movement were reviewed in detail with the patient.  I discussed the assessment and treatment plan with the patient. The patient was provided an opportunity to ask questions and all were answered. The patient agreed with the plan and demonstrated an understanding of the instructions. The patient was advised to call back or seek an in-person office evaluation/[facility] any urgent or concerning symptoms. Please refer to After Visit Summary for other counseling recommendations.   I provided 10 minutes of non-face-to-face time during this encounter.  Return in about 4 weeks (around 07/09/2019) for virtual.  Future Appointments  Date Time Provider [place]8/25/2020 10:15 AM [facility]-US  07/22/2019 10:15 AM WH-MFC [place] WH-MFCUS MFC-US    JaEmeterio ReeveMDPalaor [facility]

## 2019-07-09 ENCOUNTER — Ambulatory Visit (INDEPENDENT_AMBULATORY_CARE_PROVIDER_SITE_OTHER): Payer: Medicaid Other | Admitting: Obstetrics

## 2019-07-09 ENCOUNTER — Encounter: Payer: Self-pay | Admitting: Obstetrics

## 2019-07-09 VITALS — BP 102/76 | HR 88

## 2019-07-09 DIAGNOSIS — O3432 Maternal care for cervical incompetence, second trimester: Secondary | ICD-10-CM

## 2019-07-09 DIAGNOSIS — O0992 Supervision of high risk pregnancy, unspecified, second trimester: Secondary | ICD-10-CM

## 2019-07-09 DIAGNOSIS — O0990 Supervision of high risk pregnancy, unspecified, unspecified trimester: Secondary | ICD-10-CM

## 2019-07-09 DIAGNOSIS — O09292 Supervision of pregnancy with other poor reproductive or obstetric history, second trimester: Secondary | ICD-10-CM

## 2019-07-09 DIAGNOSIS — Z3A23 23 weeks gestation of pregnancy: Secondary | ICD-10-CM

## 2019-07-09 DIAGNOSIS — O09299 Supervision of pregnancy with other poor reproductive or obstetric history, unspecified trimester: Secondary | ICD-10-CM

## 2019-07-09 MED ORDER — ASPIRIN 81 MG PO CHEW: 81.0000 mg | CHEWABLE_TABLET | Freq: Every day | ORAL | 1 refills | Status: DC

## 2019-07-09 NOTE — Progress Notes (Signed)
TELEHEALTH OBSTETRICS PRENATAL VIRTUAL VIDEO VISIT ENCOUNTER NOTE  Provider location: [facility] [place]   I connected with Seward Speck on 07/09/19 at 10:00 AM EDT by WebEx OB MyChart Video Encounter at home and verified that I am speaking with the correct person using two identifiers.   I discussed the limitations, risks, security and privacy concerns of performing an evaluation and management service virtually and the availability of in person appointments. I also discussed with the patient that there may be a patient responsible charge related to this service. The patient expressed understanding and agreed to proceed. Subjective:  Carrie Hampton is a 28 y.o. G6P0040 at [redacted]w[redacted]d being seen today for ongoing prenatal care.  She is currently monitored for the following issues for this high-risk pregnancy and has Supervision of high-risk pregnancy; History of cervical incompetence; History of pre-eclampsia in prior pregnancy, currently pregnant; Cervical cerclage suture present; and Transient hypertension on their problem list.  Patient reports no complaints.  Contractions: Not present. Vag. Bleeding: None.  Movement: Present. Denies any leaking of fluid.   The following portions of the patient's history were reviewed and updated as appropriate: allergies, current medications, past family history, past medical history, past social history, past surgical history and problem list.   Objective:   Vitals:   07/09/19 1012  BP: 102/76  Pulse: 88    Fetal Status:     Movement: Present     General:  Alert, oriented and cooperative. Patient is in no acute distress.  Respiratory: Normal respiratory effort, no problems with respiration noted  Mental Status: Normal mood and affect. Normal behavior. Normal judgment and thought content.  Rest of physical exam deferred due to type of encounter  Imaging: [place] Mfm Ob Detail +14 Wk  Result Date: 06/10/2019  ----------------------------------------------------------------------  OBSTETRICS REPORT                       (Signed Final 06/10/2019 11:14 am) ---------------------------------------------------------------------- Patient Info  ID #:       [id number]                          D.O.B.:  Apr 15, 1991 (28 yrs)  Name:       Carrie Hampton                 Visit Date: 06/10/2019 10:05 am ---------------------------------------------------------------------- Performed By  Performed By:     Rennie Plowman          Ref. Address:     [address]                                                             [phone]  [place]                                                             [phone]  Attending:        Lin Landsmanorenthian Booker      [facility]  [facility] ---------------------------------------------------------------------- Orders   #  Description                          Code         Ordered By   1  US MFM OB DETAIL +14 WK              76811.01     Scheryl DarterJAMES ARNOLD  ----------------------------------------------------------------------   #  Order #                    Accession #                 Episode #   1  [id number]                  [id number]                  [id number]  ---------------------------------------------------------------------- Indications   [redacted] weeks gestation of pregnancy                Z3A.19   Encounter for antenatal screening for          Z36.3   malformations (NIPS, low risk female)   Poor obstetric history: Previous               O09.299   preeclampsia / eclampsia/gestational HTN   (ASA)   Cervical cerclage suture present, second       O34.32   trimester   Cervical incompetence, second trimester        O34.32   Poor obstetric history (hx of PROM  at 19       O09.219   weeks)  ---------------------------------------------------------------------- Vital Signs  Weight (lb): 132                               Height:        5'3"  BMI:         23.38 ---------------------------------------------------------------------- Fetal Evaluation  Num Of Fetuses:         1  Fetal Heart Rate(bpm):  143  Cardiac Activity:       Observed  Presentation:           Cephalic  Placenta:               Posterior  P. Cord Insertion:      Marginal insertion  Amniotic Fluid  AFI FV:      Within normal limits  Largest Pocket(cm)                              5.17 ---------------------------------------------------------------------- Biometry  BPD:        46  mm     G. Age:  19w 6d         85  %    CI:        69.52   %    70 - 86                                                          FL/HC:      17.2   %    16.1 - 18.3  HC:      176.1  mm     G. Age:  20w 1d         88  %    HC/AC:      1.19        1.09 - 1.39  AC:      148.3  mm     G. Age:  20w 1d         80  %    FL/BPD:     65.9   %  FL:       30.3  mm     G. Age:  19w 3d         57  %    FL/AC:      20.4   %    20 - 24  HUM:      29.6  mm     G. Age:  19w 5d         68  %  CER:      19.5  mm     G. Age:  18w 5d         43  %  NFT:       4.3  mm  LV:        5.4  mm  CM:        5.4  mm  Est. FW:     314  gm    0 lb 11 oz      88  % ---------------------------------------------------------------------- OB History  Gravidity:    6          SAB:   5  TOP:          0       Ectopic:  0        Living: 0 ---------------------------------------------------------------------- Gestational Age  LMP:           22w 1d        Date:  01/06/19                 EDD:   10/13/19  U/S Today:     19w 6d                                        EDD:   10/29/19  Best:          19w 0d     Det. By:  U/S C R L  (03/27/19)  EDD:   11/04/19 ---------------------------------------------------------------------- Anatomy  Cranium:                Appears normal         LVOT:                   Appears normal  Cavum:                 Appears normal         Aortic Arch:            Appears normal  Ventricles:            Appears normal         Ductal Arch:            Appears normal  Choroid Plexus:        Appears normal         Diaphragm:              Appears normal  Cerebellum:            Appears normal         Stomach:                Appears normal, left                                                                        sided  Posterior Fossa:       Appears normal         Abdomen:                Appears normal  Nuchal Fold:           Not applicable (>80    Abdominal Wall:         Appears nml (cord                         wks GA)                                        insert, abd wall)  Face:                  Appears normal         Cord Vessels:           Appears normal (3                         (orbits and profile)                           vessel cord)  Lips:                  Appears normal         Kidneys:                Appear normal  Palate:                Appears normal         Bladder:  Appears normal  Thoracic:              Appears normal         Spine:                  Appears normal  Heart:                 Appears normal         Upper Extremities:      Appears normal                         (4CH, axis, and                         situs)  RVOT:                  Appears normal         Lower Extremities:      Appears normal  Other:  Heels and 5th digit visualized. Nasal bone visualized. Fetus appears          to be a female. ---------------------------------------------------------------------- Cervix Uterus Adnexa  Cervix  Length:           4.14  cm.  Normal appearance by transabdominal scan.  Left Ovary  Within normal limits.  Right Ovary  Within normal limits.  Adnexa  No abnormality visualized. ---------------------------------------------------------------------- Impression  Normal interval growth.  No ultrasonic evidence of  structural  fetal anomalies.  Marginal cord insertion was noted < 1 cm from the placental  edge- this finding is associated with an increased risk of  oligohydramnios and fetal growth delays.  Cerclage in place secondary to prior 19 weeks loss. ---------------------------------------------------------------------- Recommendations  Continue serial growth in 6 weeks given marginal cord  insertion. ----------------------------------------------------------------------               Lin Landsmanorenthian Booker, MD Electronically Signed Final Report   06/10/2019 11:14 am ----------------------------------------------------------------------   Assessment and Plan:  Pregnancy: G6P0040 at 4214w1d  1. Supervision of high risk pregnancy, antepartum  2. Cervical cerclage suture present in second trimester  3. History of pre-eclampsia in prior pregnancy, currently pregnant Rx: - aspirin 81 MG chewable tablet; Chew 1 tablet (81 mg total) by mouth daily.  Dispense: 90 tablet; Refill: 1    Preterm labor symptoms and general obstetric precautions including but not limited to vaginal bleeding, contractions, leaking of fluid and fetal movement were reviewed in detail with the patient. I discussed the assessment and treatment plan with the patient. The patient was provided an opportunity to ask questions and all were answered. The patient agreed with the plan and demonstrated an understanding of the instructions. The patient was advised to call back or seek an in-person office evaluation/[facility] for any urgent or concerning symptoms. Please refer to After Visit Summary for other counseling recommendations.   I provided 10 minutes of face-to-face time during this encounter.  Return in about 4 weeks (around 08/06/2019) for ROB in person, 2 hour OGTT.  Future Appointments  Date Time Provider Department Center  07/22/2019 10:15 AM [facility]  07/22/2019 10:15 AM [facility] US 4  [facility]    Coral Ceoharles Jamilynn Whitacre, MD [facility], [facility] 07-09-2019

## 2019-07-09 NOTE — Progress Notes (Addendum)
I connected with Carrie Hampton on 07/09/19 at 10:00 AM EDT by telephone and verified that I am speaking with the correct person using two identifiers.  Needs dental letter prior to upcoming dentist appt. Mailed today No other concerns per pt

## 2019-07-17 ENCOUNTER — Encounter (HOSPITAL_COMMUNITY): Payer: Self-pay

## 2019-07-22 ENCOUNTER — Ambulatory Visit (HOSPITAL_COMMUNITY)
Admission: RE | Admit: 2019-07-22 | Discharge: 2019-07-22 | Disposition: A | Discharge status: Home / Self Care | Payer: Medicaid Other | Source: Ambulatory Visit | Attending: Obstetrics and Gynecology | Admitting: Obstetrics and Gynecology

## 2019-07-22 ENCOUNTER — Ambulatory Visit (HOSPITAL_COMMUNITY): Payer: Medicaid Other | Admitting: *Deleted

## 2019-07-22 ENCOUNTER — Encounter (HOSPITAL_COMMUNITY): Payer: Self-pay

## 2019-07-22 ENCOUNTER — Other Ambulatory Visit: Payer: Self-pay

## 2019-07-22 ENCOUNTER — Other Ambulatory Visit (HOSPITAL_COMMUNITY): Payer: Self-pay | Admitting: *Deleted

## 2019-07-22 DIAGNOSIS — Z3A25 25 weeks gestation of pregnancy: Secondary | ICD-10-CM

## 2019-07-22 DIAGNOSIS — O0992 Supervision of high risk pregnancy, unspecified, second trimester: Secondary | ICD-10-CM

## 2019-07-22 DIAGNOSIS — O09212 Supervision of pregnancy with history of pre-term labor, second trimester: Secondary | ICD-10-CM

## 2019-07-22 DIAGNOSIS — O43199 Other malformation of placenta, unspecified trimester: Secondary | ICD-10-CM | POA: Insufficient documentation

## 2019-07-22 DIAGNOSIS — Z362 Encounter for other antenatal screening follow-up: Secondary | ICD-10-CM | POA: Diagnosis not present

## 2019-07-22 DIAGNOSIS — O09299 Supervision of pregnancy with other poor reproductive or obstetric history, unspecified trimester: Secondary | ICD-10-CM | POA: Insufficient documentation

## 2019-07-22 DIAGNOSIS — O3432 Maternal care for cervical incompetence, second trimester: Secondary | ICD-10-CM

## 2019-07-22 DIAGNOSIS — O09292 Supervision of pregnancy with other poor reproductive or obstetric history, second trimester: Secondary | ICD-10-CM | POA: Diagnosis not present

## 2019-07-22 DIAGNOSIS — O43193 Other malformation of placenta, third trimester: Secondary | ICD-10-CM

## 2019-08-06 ENCOUNTER — Other Ambulatory Visit: Payer: Medicaid Other

## 2019-08-06 ENCOUNTER — Ambulatory Visit (INDEPENDENT_AMBULATORY_CARE_PROVIDER_SITE_OTHER): Payer: Medicaid Other | Admitting: Obstetrics and Gynecology

## 2019-08-06 ENCOUNTER — Other Ambulatory Visit: Payer: Self-pay

## 2019-08-06 ENCOUNTER — Encounter: Payer: Self-pay | Admitting: Obstetrics and Gynecology

## 2019-08-06 VITALS — BP 117/73 | HR 95 | Wt 133.2 lb

## 2019-08-06 DIAGNOSIS — O0993 Supervision of high risk pregnancy, unspecified, third trimester: Secondary | ICD-10-CM

## 2019-08-06 DIAGNOSIS — O0992 Supervision of high risk pregnancy, unspecified, second trimester: Secondary | ICD-10-CM

## 2019-08-06 DIAGNOSIS — O09299 Supervision of pregnancy with other poor reproductive or obstetric history, unspecified trimester: Secondary | ICD-10-CM

## 2019-08-06 DIAGNOSIS — O09292 Supervision of pregnancy with other poor reproductive or obstetric history, second trimester: Secondary | ICD-10-CM

## 2019-08-06 DIAGNOSIS — Z3A27 27 weeks gestation of pregnancy: Secondary | ICD-10-CM

## 2019-08-06 DIAGNOSIS — Z8742 Personal history of other diseases of the female genital tract: Secondary | ICD-10-CM

## 2019-08-06 NOTE — Progress Notes (Signed)
ROB/GTT.  Declined TDAP. Reports no problems today, she wants to know when the Cerclage will be taken out.

## 2019-08-06 NOTE — Progress Notes (Signed)
   PRENATAL VISIT NOTE  Subjective:  Carrie Hampton is a 28 y.o. [id number] at [redacted]w[redacted]d being seen today for ongoing prenatal care.  She is currently monitored for the following issues for this high-risk pregnancy and has Supervision of high-risk pregnancy; History of cervical incompetence; History of pre-eclampsia in prior pregnancy, currently pregnant; Cervical cerclage suture present; and Transient hypertension on their problem list.  Patient reports no complaints.  Contractions: Not present. Vag. Bleeding: None.  Movement: Present. Denies leaking of fluid.   The following portions of the patient's history were reviewed and updated as appropriate: allergies, current medications, past family history, past medical history, past social history, past surgical history and problem list.   Objective:   Vitals:   08/06/19 0845  BP: 117/73  Pulse: 95  Weight: 133 lb 3.2 oz (60.4 kg)    Fetal Status: Fetal Heart Rate (bpm): 145 Fundal Height: 28 cm Movement: Present     General:  Alert, oriented and cooperative. Patient is in no acute distress.  Skin: Skin is warm and dry. No rash noted.   Cardiovascular: Normal heart rate noted  Respiratory: Normal respiratory effort, no problems with respiration noted  Abdomen: Soft, gravid, appropriate for gestational age.  Pain/Pressure: Absent     Pelvic: Cervical exam deferred        Extremities: Normal range of motion.  Edema: None  Mental Status: Normal mood and affect. Normal behavior. Normal judgment and thought content.   Assessment and Plan:  Pregnancy: A2N0539 at [redacted]w[redacted]d 1. Supervision of high risk pregnancy in third trimester Patient is doing well without complaints Third trimester labs today Patient declined tdap and flu vaccine Patient plans to use condoms Patient undecided on pediatrician  2. History of pre-eclampsia in prior pregnancy, currently pregnant BP stable Continue ASA  3. History of cervical incompetence Cerclage in place  Stable  Preterm labor symptoms and general obstetric precautions including but not limited to vaginal bleeding, contractions, leaking of fluid and fetal movement were reviewed in detail with the patient. Please refer to After Visit Summary for other counseling recommendations.   Return in about 2 weeks (around 08/20/2019) for [facility] [place], High risk (can be seen by Dr. Jodi Mourning).  Future Appointments  Date Time Provider [place]  08/19/2019 10:00 AM [place] [facility]  08/19/2019 10:00 AM [place] 3 [facility]    Mora Bellman, MD

## 2019-08-07 LAB — CBC
Hematocrit: 37.8 % (ref 34.0–46.6)
Hemoglobin: 12.4 g/dL (ref 11.1–15.9)
MCH: 29.5 pg (ref 26.6–33.0)
MCHC: 32.8 g/dL (ref 31.5–35.7)
MCV: 90 fL (ref 79–97)
Platelets: 151 10*3/uL (ref 150–450)
RBC: 4.2 x10E6/uL (ref 3.77–5.28)
RDW: 12.4 % (ref 11.7–15.4)
WBC: 8.1 10*3/uL (ref 3.4–10.8)

## 2019-08-07 LAB — GLUCOSE TOLERANCE, 2 HOURS W/ 1HR
Glucose, 1 hour: 129 mg/dL (ref 65–179)
Glucose, 2 hour: 104 mg/dL (ref 65–152)
Glucose, Fasting: 83 mg/dL (ref 65–91)

## 2019-08-07 LAB — RPR: RPR Ser Ql: NONREACTIVE

## 2019-08-07 LAB — HIV ANTIBODY (ROUTINE TESTING W REFLEX): HIV Screen 4th Generation wRfx: NONREACTIVE

## 2019-08-14 ENCOUNTER — Encounter (HOSPITAL_COMMUNITY): Payer: Self-pay

## 2019-08-19 ENCOUNTER — Other Ambulatory Visit: Payer: Self-pay

## 2019-08-19 ENCOUNTER — Ambulatory Visit (HOSPITAL_COMMUNITY): Payer: Medicaid Other | Admitting: *Deleted

## 2019-08-19 ENCOUNTER — Ambulatory Visit (HOSPITAL_COMMUNITY)
Admission: RE | Admit: 2019-08-19 | Discharge: 2019-08-19 | Disposition: A | Discharge status: Home / Self Care | Payer: Medicaid Other | Source: Ambulatory Visit | Attending: Obstetrics and Gynecology | Admitting: Obstetrics and Gynecology

## 2019-08-19 ENCOUNTER — Encounter (HOSPITAL_COMMUNITY): Payer: Self-pay

## 2019-08-19 ENCOUNTER — Other Ambulatory Visit (HOSPITAL_COMMUNITY): Payer: Self-pay | Admitting: *Deleted

## 2019-08-19 DIAGNOSIS — O0993 Supervision of high risk pregnancy, unspecified, third trimester: Secondary | ICD-10-CM | POA: Diagnosis present

## 2019-08-19 DIAGNOSIS — O09293 Supervision of pregnancy with other poor reproductive or obstetric history, third trimester: Secondary | ICD-10-CM

## 2019-08-19 DIAGNOSIS — Z362 Encounter for other antenatal screening follow-up: Secondary | ICD-10-CM

## 2019-08-19 DIAGNOSIS — O09299 Supervision of pregnancy with other poor reproductive or obstetric history, unspecified trimester: Secondary | ICD-10-CM | POA: Diagnosis present

## 2019-08-19 DIAGNOSIS — Z3A29 29 weeks gestation of pregnancy: Secondary | ICD-10-CM

## 2019-08-19 DIAGNOSIS — O09213 Supervision of pregnancy with history of pre-term labor, third trimester: Secondary | ICD-10-CM | POA: Diagnosis not present

## 2019-08-19 DIAGNOSIS — O3433 Maternal care for cervical incompetence, third trimester: Secondary | ICD-10-CM | POA: Diagnosis not present

## 2019-08-19 DIAGNOSIS — O43193 Other malformation of placenta, third trimester: Secondary | ICD-10-CM | POA: Diagnosis not present

## 2019-08-19 DIAGNOSIS — O10919 Unspecified pre-existing hypertension complicating pregnancy, unspecified trimester: Secondary | ICD-10-CM

## 2019-08-20 ENCOUNTER — Inpatient Hospital Stay (HOSPITAL_COMMUNITY)
Admission: AD | Admit: 2019-08-20 | Discharge: 2019-08-20 | Disposition: A | Discharge status: Home / Self Care | Payer: Medicaid Other | Attending: Obstetrics and Gynecology | Admitting: Obstetrics and Gynecology

## 2019-08-20 ENCOUNTER — Other Ambulatory Visit: Payer: Self-pay

## 2019-08-20 ENCOUNTER — Ambulatory Visit (INDEPENDENT_AMBULATORY_CARE_PROVIDER_SITE_OTHER): Payer: Medicaid Other | Admitting: Obstetrics

## 2019-08-20 ENCOUNTER — Other Ambulatory Visit: Payer: Self-pay | Admitting: Certified Nurse Midwife

## 2019-08-20 ENCOUNTER — Encounter: Payer: Self-pay | Admitting: Obstetrics

## 2019-08-20 ENCOUNTER — Encounter (HOSPITAL_COMMUNITY): Payer: Self-pay

## 2019-08-20 DIAGNOSIS — O09293 Supervision of pregnancy with other poor reproductive or obstetric history, third trimester: Secondary | ICD-10-CM

## 2019-08-20 DIAGNOSIS — Z3689 Encounter for other specified antenatal screening: Secondary | ICD-10-CM

## 2019-08-20 DIAGNOSIS — M549 Dorsalgia, unspecified: Secondary | ICD-10-CM

## 2019-08-20 DIAGNOSIS — O3433 Maternal care for cervical incompetence, third trimester: Secondary | ICD-10-CM | POA: Diagnosis not present

## 2019-08-20 DIAGNOSIS — O0993 Supervision of high risk pregnancy, unspecified, third trimester: Secondary | ICD-10-CM

## 2019-08-20 DIAGNOSIS — Z3A29 29 weeks gestation of pregnancy: Secondary | ICD-10-CM

## 2019-08-20 DIAGNOSIS — B3731 Acute candidiasis of vulva and vagina: Secondary | ICD-10-CM

## 2019-08-20 DIAGNOSIS — Z8759 Personal history of other complications of pregnancy, childbirth and the puerperium: Secondary | ICD-10-CM

## 2019-08-20 DIAGNOSIS — B373 Candidiasis of vulva and vagina: Secondary | ICD-10-CM

## 2019-08-20 DIAGNOSIS — O09299 Supervision of pregnancy with other poor reproductive or obstetric history, unspecified trimester: Secondary | ICD-10-CM

## 2019-08-20 DIAGNOSIS — Z87891 Personal history of nicotine dependence: Secondary | ICD-10-CM | POA: Diagnosis not present

## 2019-08-20 DIAGNOSIS — O26893 Other specified pregnancy related conditions, third trimester: Secondary | ICD-10-CM

## 2019-08-20 DIAGNOSIS — Z8742 Personal history of other diseases of the female genital tract: Secondary | ICD-10-CM

## 2019-08-20 DIAGNOSIS — O163 Unspecified maternal hypertension, third trimester: Secondary | ICD-10-CM | POA: Insufficient documentation

## 2019-08-20 DIAGNOSIS — O4703 False labor before 37 completed weeks of gestation, third trimester: Secondary | ICD-10-CM

## 2019-08-20 LAB — URINALYSIS, ROUTINE W REFLEX MICROSCOPIC
Bilirubin Urine: NEGATIVE
Glucose, UA: NEGATIVE mg/dL
Ketones, ur: NEGATIVE mg/dL
Nitrite: NEGATIVE
Protein, ur: NEGATIVE mg/dL
Specific Gravity, Urine: 1.026 (ref 1.005–1.030)
WBC, UA: >50 WBC/hpf — ABNORMAL HIGH (ref 0–5)
pH: 6 (ref 5.0–8.0)

## 2019-08-20 LAB — WET PREP, GENITAL
Clue Cells Wet Prep HPF POC: NONE SEEN
Sperm: NONE SEEN
Trich, Wet Prep: NONE SEEN

## 2019-08-20 MED ORDER — CYCLOBENZAPRINE HCL 10 MG PO TABS: 10.0000 mg | ORAL_TABLET | Freq: Three times a day (TID) | ORAL | 0 refills | Status: DC | PRN

## 2019-08-20 MED ORDER — FLUCONAZOLE 150 MG PO TABS: 150.0000 mg | ORAL_TABLET | Freq: Every day | ORAL | 1 refills | Status: DC

## 2019-08-20 MED ORDER — COMFORT FIT MATERNITY SUPP SM MISC: 0 refills | Status: DC

## 2019-08-20 MED ORDER — NIFEDIPINE 10 MG PO CAPS
10.0000 mg | ORAL_CAPSULE | ORAL | Status: DC | PRN
  Administered 2019-08-20 (×3): 10 mg via ORAL
  Filled 2019-08-20 (×3): qty 1

## 2019-08-20 NOTE — Progress Notes (Signed)
TELEHEALTH OBSTETRICS PRENATAL VIRTUAL VIDEO VISIT ENCOUNTER NOTE  Provider location: [facility] [employer] [place]   I connected with Seward Speck on 08/20/19 at  9:30 AM EDT by OB MyChart Video Encounter at home and verified that I am speaking with the correct person using two identifiers.   I discussed the limitations, risks, security and privacy concerns of performing an evaluation and management service virtually and the availability of in person appointments. I also discussed with the patient that there may be a patient responsible charge related to this service. The patient expressed understanding and agreed to proceed.  Subjective:  LORECE KEACH is a 28 y.o. G6P0040 at [redacted]w[redacted]d being seen today for ongoing prenatal care.  She is currently monitored for the following issues for this low-risk pregnancy and has Supervision of high-risk pregnancy; History of cervical incompetence; History of pre-eclampsia in prior pregnancy, currently pregnant; Cervical cerclage suture present; and Transient hypertension on their problem list.  Patient reports stomach tightening, not painful.  Contractions: Irritability. Vag. Bleeding: None.  Movement: Present. Denies any leaking of fluid.   The following portions of the patient's history were reviewed and updated as appropriate: allergies, current medications, past family history, past medical history, past social history, past surgical history and problem list.   Objective:  There were no vitals filed for this visit.  Fetal Status:     Movement: Present     General:  Alert, oriented and cooperative. Patient is in no acute distress.  Respiratory: Normal respiratory effort, no problems with respiration noted  Mental Status: Normal mood and affect. Normal behavior. Normal judgment and thought content.  Rest of physical exam deferred due to type of encounter  Imaging: [place] Mfm Ob Follow Up  Result Date: 08/19/2019  ----------------------------------------------------------------------  OBSTETRICS REPORT                       (Signed Final 08/19/2019 11:19 am) ---------------------------------------------------------------------- Patient Info  ID #:       [id number]                          D.O.B.:  11-24-1991 (28 yrs)  Name:       Darci Needle Doggett                 Visit Date: 08/19/2019 10:14 am ---------------------------------------------------------------------- Performed By  Performed By:     Sandi Mealy        Ref. Address:     [address]                                                             [phone]  [place]                                                             [id number]  Attending:        Ma RingsVictor Fang MD         [facility]  [facility]erred By[facility]      [facility] ---------------------------------------------------------------------- Orders   #  Description                          Code         Ordered By   1  US MFM OB FOLLOW UP                  [id number]     [facility]  ----------------------------------------------------------------------   #  Order #                    Accession #                 Episode #   1  [id number]                  [phone]                  [id number]  ---------------------------------------------------------------------- Indications   Cervical cerclage suture present, third        O34.33   trimester   Cervical incompetence, third trimester         O34.33   Encounter for other antenatal screening        Z36.2   follow-up (NIPS, low risk female)   Poor obstetric history: Previous               O09.299   preeclampsia / eclampsia/gestational HTN   (ASA)   Poor obstetric history (hx of PROM at 19       O09.219   weeks)   [redacted] weeks gestation of  pregnancy                Z3A.29  ---------------------------------------------------------------------- Vital Signs  Weight (lb): 133                               Height:        5'3"  BMI:         23.56 ---------------------------------------------------------------------- Fetal Evaluation  Num Of Fetuses:         1  Fetal Heart Rate(bpm):  142  Cardiac Activity:       Observed  Presentation:           Cephalic  Placenta:               Posterior  P. Cord Insertion:      Marginal insertion  Amniotic Fluid  AFI FV:  Within normal limits  AFI Sum(cm)     %Tile       Largest Pocket(cm)  14.87           52          7.3  RUQ(cm)       RLQ(cm)       LUQ(cm)        LLQ(cm)  7.3           1.57          6              0 ---------------------------------------------------------------------- Biometry  BPD:      76.7  mm     G. Age:  30w 5d         88  %    CI:        82.38   %    70 - 86                                                          FL/HC:      21.3   %    19.6 - 20.8  HC:      266.6  mm     G. Age:  29w 0d         19  %    HC/AC:      1.07        0.99 - 1.21  AC:      249.3  mm     G. Age:  29w 1d         48  %    FL/BPD:     74.2   %    71 - 87  FL:       56.9  mm     G. Age:  29w 6d         60  %    FL/AC:      22.8   %    20 - 24  HUM:      53.3  mm     G. Age:  31w 0d         87  %  LV:        2.9  mm  Est. FW:    1401  gm      3 lb 1 oz     55  % ---------------------------------------------------------------------- OB History  Gravidity:    6          SAB:   5  TOP:          0       Ectopic:  0        Living: 0 ---------------------------------------------------------------------- Gestational Age  LMP:           32w 1d        Date:  01/06/19                 EDD:   10/13/19  U/S Today:     29w 5d                                        EDD:   10/30/19  Best:  29w 0d     Det. By:  U/S C R L  (03/27/19)    EDD:   11/04/19 ---------------------------------------------------------------------- Anatomy   Cranium:               Appears normal         Diaphragm:              Appears normal  Cavum:                 Appears normal         Stomach:                Appears normal, left                                                                        sided  Ventricles:            Appears normal         Abdomen:                Appears normal  Thoracic:              Appears normal         Kidneys:                Appear normal  Heart:                 Appears normal         Bladder:                Appears normal                         (4CH, axis, and                         situs) ---------------------------------------------------------------------- Comments  This patient was seen for a follow up growth scan due to a  fetus with a marginal placental cord insertion.  She denies  any problems since her last exam.  She was informed that the fetal growth and amniotic fluid  level appears appropriate for her gestational age.  A follow up exam was scheduled in 5 weeks. ----------------------------------------------------------------------                   Ma Rings, MD Electronically Signed Final Report   08/19/2019 11:19 am ----------------------------------------------------------------------  [place] Mfm Ob Follow Up  Result Date: 07/23/2019 ----------------------------------------------------------------------  OBSTETRICS REPORT                        (Signed Final 07/23/2019 10:17 am) ---------------------------------------------------------------------- Patient Info  ID #:       [id number]                          D.O.B.:  1991-09-13 (28 yrs)  Name:       HAELIE CLAPP Dall                 Visit Date: 07/22/2019 10:46 am ---------------------------------------------------------------------- Performed By  Performed By:     Lenise Arena        Ref.  Address:      [place]                                                              [place]  Attending:        Tama High MD        [facility]  Referred By:      [facility] ---------------------------------------------------------------------- Orders   #  Description                          Code         Ordered By   1  [place] MFM OB FOLLOW UP                  78676.72     Sander Nephew  ----------------------------------------------------------------------   #  Order #                    Accession #                 Episode #   1  [id number]                  [id number]  [id number]  ---------------------------------------------------------------------- Indications   Encounter for other antenatal screening        Z36.2   follow-up (NIPS, low risk female)   Poor obstetric history: Previous               O09.299   preeclampsia / eclampsia/gestational HTN   (ASA)   Cervical cerclage suture present, second       O34.32   trimester   Cervical incompetence, second trimester        O34.32   Poor obstetric history (hx of PROM at 21       O09.219   weeks)   [redacted] weeks gestation of pregnancy                Z3A.25  ---------------------------------------------------------------------- Vital Signs  Weight (lb): 118                               Height:        5'3"  BMI:         20.9 ---------------------------------------------------------------------- Fetal Evaluation  Num Of Fetuses:          1  Fetal Heart Rate(bpm):   151  Cardiac Activity:        Observed  Presentation:            Breech  Placenta:                Posterior  P. Cord Insertion:       Marginal insertion  Amniotic Fluid  AFI FV:      Within normal limits                              Largest Pocket(cm)                              4.2  ---------------------------------------------------------------------- Biometry  BPD:      62.6  mm     G. Age:  25w 3d         56  %    CI:        72.04   %    70 - 86                                                          FL/HC:       20.4  %    18.7 - 20.3  HC:      234.7  mm     G. Age:  25w 4d         47  %    HC/AC:       1.06       1.04 - 1.22  AC:      221.9  mm     G. Age:  26w 4d         86  %    FL/BPD:      76.4  %    71 - 87  FL:       47.8  mm     G. Age:  26w 0d         68  %  FL/AC:       21.5  %    20 - 24  LV:        2.3  mm  Est. FW:     907   gm          2 lb     89  % ---------------------------------------------------------------------- OB History  Gravidity:    6          SAB:   5  TOP:          0       Ectopic:  0        Living: 0 ---------------------------------------------------------------------- Gestational Age  LMP:           28w 1d        Date:  01/06/19                 EDD:   10/13/19  U/S Today:     25w 6d                                        EDD:   10/29/19  Best:          Alcus Dad 0d     Det. By:  U/S C R L  (03/27/19)    EDD:   11/04/19 ---------------------------------------------------------------------- Anatomy  Cranium:               Appears normal         LVOT:                   Previously seen  Cavum:                 Appears normal         Aortic Arch:            Previously seen  Ventricles:            Appears normal         Ductal Arch:            Previously seen  Choroid Plexus:        Previously seen        Diaphragm:              Appears normal  Cerebellum:            Previously seen        Stomach:                Appears normal, left                                                                        sided  Posterior Fossa:       Previously seen        Abdomen:                Appears normal  Nuchal Fold:           Not applicable (>20    Abdominal Wall:         Previously seen  wks GA)  Face:                  Orbits and profile     Cord  Vessels:           Previously seen                         previously seen  Lips:                  Previously seen        Kidneys:                Appear normal  Palate:                Previously seen        Bladder:                Appears normal  Thoracic:              Appears normal         Spine:                  Previously seen  Heart:                 Appears normal         Upper Extremities:      Previously seen                         (4CH, axis, and                         situs)  RVOT:                  Previously seen        Lower Extremities:      Previously seen  Other:  Heels and 5th digit visualized. Nasal bone visualized. Fetus appears          to be a female. ---------------------------------------------------------------------- Cervix Uterus Adnexa  Cervix  Not visualized (advanced GA >24wks) ---------------------------------------------------------------------- Impression  Amniotic fluid is normal and good fetal activity is seen. Fetal  growth is appropriate for gestational age. ---------------------------------------------------------------------- Recommendations  -An appointment was made for her to return in 4 weeks for  fetal growth assessment. ----------------------------------------------------------------------                  Noralee Space, MD Electronically Signed Final Report   07/23/2019 10:17 am ----------------------------------------------------------------------   Assessment and Plan:  Pregnancy: G6P0040 at [redacted]w[redacted]d  1. Supervision of high risk pregnancy in third trimester  2. History of cervical incompetence  3. Cervical cerclage suture present in third trimester  4. History of pre-eclampsia in prior pregnancy, currently pregnant - taking Baby ASA  5. Backache symptom Rx: - Elastic Bandages & Supports (COMFORT FIT MATERNITY SUPP SM) MISC; Wear as directed.  Dispense: 1 each; Refill: 0   Preterm labor symptoms and general obstetric precautions including but not limited to  vaginal bleeding, contractions, leaking of fluid and fetal movement were reviewed in detail with the patient. I discussed the assessment and treatment plan with the patient. The patient was provided an opportunity to ask questions and all were answered. The patient agreed with the plan and demonstrated an understanding of the instructions. The patient was advised to call back or seek an in-person office evaluation/go to [facility]U at [facility]  for any urgent or concerning symptoms. Please refer to After Visit Summary for other counseling recommendations.   I provided 10 minutes of face-to-face time during this encounter.  Return in about 2 weeks (around 09/03/2019) for ROB.  Future Appointments  Date Time Provider Department Center  09/23/2019  9:45 AM WH-MFC NURSE WH-MFC MFC-US  09/23/2019  9:45 AM WH-MFC [place] 2 WH-MFCUS MFC-US    Coral Ceo, MD [facility], [facility] 08/20/2019

## 2019-08-20 NOTE — MAU Note (Signed)
Pt sent here for contractions that have been going on for a week, did not time them. Pain is 2/10. Denies LOF, VB. +FM.  PT has cerclage in place. Was sent by provider.

## 2019-08-20 NOTE — MAU Provider Note (Signed)
History     CSN: [id number]  Arrival date and time: 08/20/19 1302   First Provider Initiated Contact with Patient 08/20/19 1351       Chief Complaint  Patient presents with  . Contractions   Carrie Hampton is a 28 y.o. G6P0 at 19w1dwho presents to MAU with complaints of contractions. She reports occasional tightening that has been occurring for the past 2 weeks, has not timed them. Had a virtual prenatal appointment today and was told to come to MAU for further assessment. She has a cerclage in place and reports having IC this morning. +FM. Denies vaginal bleeding. She believes that yeast infection that was treated with [place] not resolved and is currently having increased vaginal discharge. She reports only drinking 1 cup of water per day.    OB History    Gravida  6   Para  1   Term  0   Preterm  0   AB  4   Living  0     SAB  2   TAB  2   Ectopic  0   Multiple  0   Live Births  0           Past Medical History:  Diagnosis Date  . Hypertension   . Premature rupture of membranes 11/03/2017   Patient had PROM at 19 weeks.   . STD (female) 2013   Chlamydia    Past Surgical History:  Procedure Laterality Date  . CERVICAL CERCLAGE N/A 04/22/2019   Procedure: CERCLAGE CERVICAL;  Surgeon: PAletha Halim MD;  Location: [facility] LD ORS;  Service: Gynecology;  Laterality: N/A;  . TOOTH EXTRACTION      Family History  Problem Relation Age of Onset  . Alcohol abuse Neg Hx   . Arthritis Neg Hx   . Asthma Neg Hx   . Birth defects Neg Hx   . Cancer Neg Hx   . COPD Neg Hx   . Depression Neg Hx   . Diabetes Neg Hx   . Drug abuse Neg Hx   . Early death Neg Hx   . Hearing loss Neg Hx   . Heart disease Neg Hx   . Hyperlipidemia Neg Hx   . Hypertension Neg Hx   . Kidney disease Neg Hx   . Learning disabilities Neg Hx   . Mental illness Neg Hx   . Mental retardation Neg Hx   . Miscarriages / Stillbirths Neg Hx   . Stroke Neg Hx   . Vision loss Neg Hx      Social History   Tobacco Use  . Smoking status: Former Smoker    Packs/day: 1.00    Types: Cigars    Quit date: 10/20/2012    Years since quitting: 6.8  . Smokeless tobacco: Never Used  Substance Use Topics  . Alcohol use: No    Comment: socially  . Drug use: No    Types: Marijuana    Comment: none per pt 03/24/19    Allergies: No Known Allergies  Medications Prior to Admission  Medication Sig Dispense Refill Last Dose  . Prenatal Vit-Fe Fumarate-FA (PRENATAL VITAMIN PO) Take by mouth.   08/20/2019 at Unknown time  . aspirin 81 MG chewable tablet Chew 1 tablet (81 mg total) by mouth daily. (Patient not taking: Reported on 07/22/2019) 90 tablet 1   . Blood Pressure Monitoring KIT 1 kit by Does not apply route once a week. 1 kit 0   .  docusate sodium (COLACE) 100 MG capsule Take 1 capsule (100 mg total) by mouth 2 (two) times daily as needed. (Patient not taking: Reported on 06/10/2019) 30 capsule 2   . Elastic Bandages & Supports (COMFORT FIT MATERNITY SUPP SM) MISC Wear as directed. 1 each 0   . promethazine (PHENERGAN) 25 MG tablet Take 25 mg by mouth every 6 (six) hours as needed for nausea or vomiting.    at not taking  . terconazole (TERAZOL 7) 0.4 % vaginal cream Place 1 applicator vaginally at bedtime. (Patient not taking: Reported on 06/10/2019) 45 g 0     Review of Systems  Constitutional: Negative.   Respiratory: Negative.   Cardiovascular: Negative.   Gastrointestinal: Positive for abdominal pain. Negative for constipation, diarrhea, nausea and vomiting.       Contractions- tightening, not painful   Genitourinary: Positive for vaginal discharge. Negative for difficulty urinating, dysuria, frequency, hematuria, pelvic pain, urgency and vaginal bleeding.  Musculoskeletal: Negative.   Neurological: Negative.    Physical Exam   Blood pressure 104/63, pulse 86, temperature 98.4 F (36.9 C), resp. rate 16, height '5\' 4"'  (1.626 m), weight 60.2 kg, last menstrual period  01/06/2019, SpO2 100 %, unknown if currently breastfeeding.  Physical Exam  Nursing note and vitals reviewed. Constitutional: She is oriented to person, place, and time. She appears well-developed and well-nourished. No distress.  Cardiovascular: Normal rate and regular rhythm.  Respiratory: Effort normal and breath sounds normal. No respiratory distress. She has no wheezes.  GI: Soft. There is no abdominal tenderness. There is no rebound.  Gravid appropriate for gestational age   Genitourinary:    Vaginal discharge present.     No vaginal bleeding.  No bleeding in the vagina.    Genitourinary Comments: Pelvic exam: Cervix pink, visually slightly open to 0.5cm- cerclage present no pulling of cerclage noted, without lesion, moderate amount of white creamy discharge, vaginal walls and external genitalia normal   Musculoskeletal: Normal range of motion.        General: No edema.  Neurological: She is alert and oriented to person, place, and time.  Psychiatric: She has a normal mood and affect. Her behavior is normal. Thought content normal.   Dilation: 1(cerclage in place) Effacement (%): Thick Cervical Position: Posterior Exam by:: V Rome Echavarria CNM  FHR: 145/ moderate/ +accels/ no decelerations  Toco: UI with occasional mild UC   MAU Course  Procedures  MDM Orders Placed This Encounter  Procedures  . Wet prep, genital  . Urinalysis, Routine w reflex microscopic  . Encourage/Reinforce Importance Po Fluids   Meds ordered this encounter  Medications  . NIFEdipine (PROCARDIA) capsule 10 mg   FFN unable to be collected d/t recent IC this morning  Wet prep pending  Procardia series ordered   Encouraged patient to increase amount of water she consumes and pitcher of water given to patient while in MAU.  Discussed with patient importance of abstaining from intercourse with cervical cerclage- patient verbalizes understanding.   Reviewed results of wet prep Results for orders placed  or performed during the hospital encounter of 08/20/19 (from the past 24 hour(s))  Urinalysis, Routine w reflex microscopic     Status: Abnormal   Collection Time: 08/20/19  1:48 PM  Result Value Ref Range   Color, Urine YELLOW YELLOW   APPearance HAZY (A) CLEAR   Specific Gravity, Urine 1.026 1.005 - 1.030   pH 6.0 5.0 - 8.0   Glucose, UA NEGATIVE NEGATIVE mg/dL   Hgb urine  dipstick SMALL (A) NEGATIVE   Bilirubin Urine NEGATIVE NEGATIVE   Ketones, ur NEGATIVE NEGATIVE mg/dL   Protein, ur NEGATIVE NEGATIVE mg/dL   Nitrite NEGATIVE NEGATIVE   Leukocytes,Ua LARGE (A) NEGATIVE   RBC / HPF 11-20 0 - 5 RBC/hpf   WBC, UA >50 (H) 0 - 5 WBC/hpf   Bacteria, UA RARE (A) NONE SEEN   Squamous Epithelial / LPF 6-10 0 - 5   Mucus PRESENT   Wet prep, genital     Status: Abnormal   Collection Time: 08/20/19  1:51 PM   Specimen: Cervix  Result Value Ref Range   Yeast Wet Prep HPF POC PRESENT (A) NONE SEEN   Trich, Wet Prep NONE SEEN NONE SEEN   Clue Cells Wet Prep HPF POC NONE SEEN NONE SEEN   WBC, Wet Prep HPF POC MANY (A) NONE SEEN   Sperm NONE SEEN    Rx for diflucan sent to pharmacy of choice for yeast infection. Patient requesting medication for back pain- patient drove self to hospital, unable to give medication in MAU, small course of Flexeril sent to pharmacy for use at home.   NST reactive and reassuring  Reassessment patient reports UC have resolved, cervix unchanged. Instructed patient not to have intercourse, patient verbalizes understanding. Rx for diflucan sent to pharmacy for yeast infection. Pt stable at time of discharge.   Assessment and Plan   1. Threatened preterm labor, third trimester   2. History of pre-eclampsia in prior pregnancy, currently pregnant   3. Supervision of high risk pregnancy in third trimester   4. Cervical cerclage suture present in third trimester   5. [redacted] weeks gestation of pregnancy   6. NST (non-stress test) reactive    Discharge home Follow up  as scheduled for prenatal appointments  Return to MAU as needed Rx for flexeril and diflucan  Pelvic rest and hydration   Follow-up [place] Follow up.   Specialty: Obstetrics and Gynecology Why: Follow up as scheduled for prenatal appointment  Contact information: [address] [place] [place] 08/20/2019, 3:55 PM

## 2019-08-20 NOTE — Progress Notes (Signed)
Pt states she thinks she may be having some ctx, not very often.  Pt states she feels her yeast infection did not completely go away treatment.  Pt unable to get BP to take today.

## 2019-08-21 ENCOUNTER — Other Ambulatory Visit: Payer: Self-pay

## 2019-08-21 NOTE — Progress Notes (Signed)
Support belt RX printed for patient.

## 2019-09-03 ENCOUNTER — Ambulatory Visit (INDEPENDENT_AMBULATORY_CARE_PROVIDER_SITE_OTHER): Payer: Medicaid Other | Admitting: Certified Nurse Midwife

## 2019-09-03 ENCOUNTER — Other Ambulatory Visit: Payer: Self-pay

## 2019-09-03 ENCOUNTER — Encounter: Payer: Self-pay | Admitting: Certified Nurse Midwife

## 2019-09-03 VITALS — BP 117/80 | HR 99 | Wt 134.6 lb

## 2019-09-03 DIAGNOSIS — Z3A31 31 weeks gestation of pregnancy: Secondary | ICD-10-CM

## 2019-09-03 DIAGNOSIS — R030 Elevated blood-pressure reading, without diagnosis of hypertension: Secondary | ICD-10-CM

## 2019-09-03 DIAGNOSIS — O3433 Maternal care for cervical incompetence, third trimester: Secondary | ICD-10-CM

## 2019-09-03 DIAGNOSIS — O43199 Other malformation of placenta, unspecified trimester: Secondary | ICD-10-CM | POA: Insufficient documentation

## 2019-09-03 DIAGNOSIS — O3430 Maternal care for cervical incompetence, unspecified trimester: Secondary | ICD-10-CM

## 2019-09-03 DIAGNOSIS — O0993 Supervision of high risk pregnancy, unspecified, third trimester: Secondary | ICD-10-CM

## 2019-09-03 DIAGNOSIS — O43193 Other malformation of placenta, third trimester: Secondary | ICD-10-CM

## 2019-09-03 NOTE — Progress Notes (Signed)
Patient reports fetal movement with some uterine irritability. 

## 2019-09-03 NOTE — Progress Notes (Signed)
   PRENATAL VISIT NOTE  Subjective:  Carrie Hampton is a 28 y.o. [id number] at [redacted]w[redacted]d being seen today for ongoing prenatal care.  She is currently monitored for the following issues for this high-risk pregnancy and has Supervision of high-risk pregnancy; History of cervical incompetence; History of pre-eclampsia in prior pregnancy, currently pregnant; Cervical cerclage suture present; and Transient hypertension on their problem list.  Patient reports no complaints.  Contractions: Irritability. Vag. Bleeding: None.  Movement: Present. Denies leaking of fluid.   The following portions of the patient's history were reviewed and updated as appropriate: allergies, current medications, past family history, past medical history, past social history, past surgical history and problem list.   Objective:   Vitals:   09/03/19 0907  BP: 117/80  Pulse: 99  Weight: 134 lb 9.6 oz (61.1 kg)    Fetal Status: Fetal Heart Rate (bpm): 140 Fundal Height: 28 cm Movement: Present     General:  Alert, oriented and cooperative. Patient is in no acute distress.  Skin: Skin is warm and dry. No rash noted.   Cardiovascular: Normal heart rate noted  Respiratory: Normal respiratory effort, no problems with respiration noted  Abdomen: Soft, gravid, appropriate for gestational age.  Pain/Pressure: Present     Pelvic: Cervical exam deferred        Extremities: Normal range of motion.  Edema: None  Mental Status: Normal mood and affect. Normal behavior. Normal judgment and thought content.   Assessment and Plan:  Pregnancy: [id number] at [redacted]w[redacted]d 1. Supervision of high risk pregnancy in third trimester - Patient doing well, no complaints - Anticipatory guidance on upcoming appointments - Patient denies continued contractions that she was seen in [facility] for, she denies pelvic pain or abdominal pain  - MCI diagnosed on [place] - follow up appointment for fetal growth scheduled   2. Cervical cerclage suture present, antepartum -  Cerclage to be removed around 36 weeks  - Discussed reasons to return to [facility] , discussed PTL precautions and vaginal bleeding   3. Transient hypertension - BP normal and stable   Preterm labor symptoms and general obstetric precautions including but not limited to vaginal bleeding, contractions, leaking of fluid and fetal movement were reviewed in detail with the patient. Please refer to After Visit Summary for other counseling recommendations.   Return in about 2 weeks (around 09/17/2019) for [place] in person .  Future Appointments  Date Time Provider [place]  09/17/2019  8:45 AM Chancy Milroy, MD [place] None  09/23/2019  9:45 AM [place] NURSE [place] [facility]  09/23/2019  9:45 AM [place] 2 [facility]    Lajean Manes, CNM

## 2019-09-03 NOTE — Patient Instructions (Signed)

## 2019-09-17 ENCOUNTER — Other Ambulatory Visit: Payer: Self-pay

## 2019-09-17 ENCOUNTER — Ambulatory Visit (INDEPENDENT_AMBULATORY_CARE_PROVIDER_SITE_OTHER): Payer: Medicaid Other | Admitting: Obstetrics and Gynecology

## 2019-09-17 ENCOUNTER — Encounter: Payer: Self-pay | Admitting: Obstetrics and Gynecology

## 2019-09-17 VITALS — BP 107/73 | HR 92 | Wt 133.0 lb

## 2019-09-17 DIAGNOSIS — Z8742 Personal history of other diseases of the female genital tract: Secondary | ICD-10-CM

## 2019-09-17 DIAGNOSIS — O09293 Supervision of pregnancy with other poor reproductive or obstetric history, third trimester: Secondary | ICD-10-CM

## 2019-09-17 DIAGNOSIS — O3433 Maternal care for cervical incompetence, third trimester: Secondary | ICD-10-CM

## 2019-09-17 DIAGNOSIS — O43199 Other malformation of placenta, unspecified trimester: Secondary | ICD-10-CM

## 2019-09-17 DIAGNOSIS — O0993 Supervision of high risk pregnancy, unspecified, third trimester: Secondary | ICD-10-CM

## 2019-09-17 DIAGNOSIS — Z3A33 33 weeks gestation of pregnancy: Secondary | ICD-10-CM

## 2019-09-17 DIAGNOSIS — O43193 Other malformation of placenta, third trimester: Secondary | ICD-10-CM

## 2019-09-17 DIAGNOSIS — O09299 Supervision of pregnancy with other poor reproductive or obstetric history, unspecified trimester: Secondary | ICD-10-CM

## 2019-09-17 NOTE — Patient Instructions (Signed)
Third Trimester of Pregnancy The third trimester is from week 28 through week 40 (months 7 through 9). The third trimester is a time when the unborn baby (fetus) is growing rapidly. At the end of the ninth month, the fetus is about 20 inches in length and weighs 6-10 pounds. Body changes during your third trimester Your body will continue to go through many changes during pregnancy. The changes vary from woman to woman. During the third trimester:  Your weight will continue to increase. You can expect to gain 25-35 pounds (11-16 kg) by the end of the pregnancy.  You may begin to get stretch marks on your hips, abdomen, and breasts.  You may urinate more often because the fetus is moving lower into your pelvis and pressing on your bladder.  You may develop or continue to have heartburn. This is caused by increased hormones that slow down muscles in the digestive tract.  You may develop or continue to have constipation because increased hormones slow digestion and cause the muscles that push waste through your intestines to relax.  You may develop hemorrhoids. These are swollen veins (varicose veins) in the rectum that can itch or be painful.  You may develop swollen, bulging veins (varicose veins) in your legs.  You may have increased body aches in the pelvis, back, or thighs. This is due to weight gain and increased hormones that are relaxing your joints.  You may have changes in your hair. These can include thickening of your hair, rapid growth, and changes in texture. Some women also have hair loss during or after pregnancy, or hair that feels dry or thin. Your hair will most likely return to normal after your baby is born.  Your breasts will continue to grow and they will continue to become tender. A yellow fluid (colostrum) may leak from your breasts. This is the first milk you are producing for your baby.  Your belly button may stick out.  You may notice more swelling in your hands,  face, or ankles.  You may have increased tingling or numbness in your hands, arms, and legs. The skin on your belly may also feel numb.  You may feel short of breath because of your expanding uterus.  You may have more problems sleeping. This can be caused by the size of your belly, increased need to urinate, and an increase in your body's metabolism.  You may notice the fetus "dropping," or moving lower in your abdomen (lightening).  You may have increased vaginal discharge.  You may notice your joints feel loose and you may have pain around your pelvic bone. What to expect at prenatal visits You will have prenatal exams every 2 weeks until week 36. Then you will have weekly prenatal exams. During a routine prenatal visit:  You will be weighed to make sure you and the baby are growing normally.  Your blood pressure will be taken.  Your abdomen will be measured to track your baby's growth.  The fetal heartbeat will be listened to.  Any test results from the previous visit will be discussed.  You may have a cervical check near your due date to see if your cervix has softened or thinned (effaced).  You will be tested for Group B streptococcus. This happens between 35 and 37 weeks. Your health care provider may ask you:  What your birth plan is.  How you are feeling.  If you are feeling the baby move.  If you have had any abnormal   symptoms, such as leaking fluid, bleeding, severe headaches, or abdominal cramping.  If you are using any tobacco products, including cigarettes, chewing tobacco, and electronic cigarettes.  If you have any questions. Other tests or screenings that may be performed during your third trimester include:  Blood tests that check for low iron levels (anemia).  Fetal testing to check the health, activity level, and growth of the fetus. Testing is done if you have certain medical conditions or if there are problems during the pregnancy.  Nonstress test  (NST). This test checks the health of your baby to make sure there are no signs of problems, such as the baby not getting enough oxygen. During this test, a belt is placed around your belly. The baby is made to move, and its heart rate is monitored during movement. What is false labor? False labor is a condition in which you feel small, irregular tightenings of the muscles in the womb (contractions) that usually go away with rest, changing position, or drinking water. These are called Braxton Hicks contractions. Contractions may last for hours, days, or even weeks before true labor sets in. If contractions come at regular intervals, become more frequent, increase in intensity, or become painful, you should see your health care provider. What are the signs of labor?  Abdominal cramps.  Regular contractions that start at 10 minutes apart and become stronger and more frequent with time.  Contractions that start on the top of the uterus and spread down to the lower abdomen and back.  Increased pelvic pressure and dull back pain.  A watery or bloody mucus discharge that comes from the vagina.  Leaking of amniotic fluid. This is also known as your "water breaking." It could be a slow trickle or a gush. Let your health care provider know if it has a color or strange odor. If you have any of these signs, call your health care provider right away, even if it is before your due date. Follow these instructions at home: Medicines  Follow your health care provider's instructions regarding medicine use. Specific medicines may be either safe or unsafe to take during pregnancy.  Take a prenatal vitamin that contains at least 600 micrograms (mcg) of folic acid.  If you develop constipation, try taking a stool softener if your health care provider approves. Eating and drinking   Eat a balanced diet that includes fresh fruits and vegetables, whole grains, good sources of protein such as meat, eggs, or tofu,  and low-fat dairy. Your health care provider will help you determine the amount of weight gain that is right for you.  Avoid raw meat and uncooked cheese. These carry germs that can cause birth defects in the baby.  If you have low calcium intake from food, talk to your health care provider about whether you should take a daily calcium supplement.  Eat four or five small meals rather than three large meals a day.  Limit foods that are high in fat and processed sugars, such as fried and sweet foods.  To prevent constipation: ? Drink enough fluid to keep your urine clear or pale yellow. ? Eat foods that are high in fiber, such as fresh fruits and vegetables, whole grains, and beans. Activity  Exercise only as directed by your health care provider. Most women can continue their usual exercise routine during pregnancy. Try to exercise for 30 minutes at least 5 days a week. Stop exercising if you experience uterine contractions.  Avoid heavy lifting.  Do   not exercise in extreme heat or humidity, or at high altitudes.  Wear low-heel, comfortable shoes.  Practice good posture.  You may continue to have sex unless your health care provider tells you otherwise. Relieving pain and discomfort  Take frequent breaks and rest with your legs elevated if you have leg cramps or low back pain.  Take warm sitz baths to soothe any pain or discomfort caused by hemorrhoids. Use hemorrhoid cream if your health care provider approves.  Wear a good support bra to prevent discomfort from breast tenderness.  If you develop varicose veins: ? Wear support pantyhose or compression stockings as told by your healthcare provider. ? Elevate your feet for 15 minutes, 3-4 times a day. Prenatal care  Write down your questions. Take them to your prenatal visits.  Keep all your prenatal visits as told by your health care provider. This is important. Safety  Wear your seat belt at all times when driving.  Make  a list of emergency phone numbers, including numbers for family, friends, the hospital, and police and fire departments. General instructions  Avoid cat litter boxes and soil used by cats. These carry germs that can cause birth defects in the baby. If you have a cat, ask someone to clean the litter box for you.  Do not travel far distances unless it is absolutely necessary and only with the approval of your health care provider.  Do not use hot tubs, steam rooms, or saunas.  Do not drink alcohol.  Do not use any products that contain nicotine or tobacco, such as cigarettes and e-cigarettes. If you need help quitting, ask your health care provider.  Do not use any medicinal herbs or unprescribed drugs. These chemicals affect the formation and growth of the baby.  Do not douche or use tampons or scented sanitary pads.  Do not cross your legs for long periods of time.  To prepare for the arrival of your baby: ? Take prenatal classes to understand, practice, and ask questions about labor and delivery. ? Make a trial run to the hospital. ? Visit the hospital and tour the maternity area. ? Arrange for maternity or paternity leave through employers. ? Arrange for family and friends to take care of pets while you are in the hospital. ? Purchase a rear-facing car seat and make sure you know how to install it in your car. ? Pack your hospital bag. ? Prepare the baby's nursery. Make sure to remove all pillows and stuffed animals from the baby's crib to prevent suffocation.  Visit your dentist if you have not gone during your pregnancy. Use a soft toothbrush to brush your teeth and be gentle when you floss. Contact a health care provider if:  You are unsure if you are in labor or if your water has broken.  You become dizzy.  You have mild pelvic cramps, pelvic pressure, or nagging pain in your abdominal area.  You have lower back pain.  You have persistent nausea, vomiting, or diarrhea.   You have an unusual or bad smelling vaginal discharge.  You have pain when you urinate. Get help right away if:  Your water breaks before 37 weeks.  You have regular contractions less than 5 minutes apart before 37 weeks.  You have a fever.  You are leaking fluid from your vagina.  You have spotting or bleeding from your vagina.  You have severe abdominal pain or cramping.  You have rapid weight loss or weight gain.  You have   shortness of breath with chest pain.  You notice sudden or extreme swelling of your face, hands, ankles, feet, or legs.  Your baby makes fewer than 10 movements in 2 hours.  You have severe headaches that do not go away when you take medicine.  You have vision changes. Summary  The third trimester is from week 28 through week 40, months 7 through 9. The third trimester is a time when the unborn baby (fetus) is growing rapidly.  During the third trimester, your discomfort may increase as you and your baby continue to gain weight. You may have abdominal, leg, and back pain, sleeping problems, and an increased need to urinate.  During the third trimester your breasts will keep growing and they will continue to become tender. A yellow fluid (colostrum) may leak from your breasts. This is the first milk you are producing for your baby.  False labor is a condition in which you feel small, irregular tightenings of the muscles in the womb (contractions) that eventually go away. These are called Braxton Hicks contractions. Contractions may last for hours, days, or even weeks before true labor sets in.  Signs of labor can include: abdominal cramps; regular contractions that start at 10 minutes apart and become stronger and more frequent with time; watery or bloody mucus discharge that comes from the vagina; increased pelvic pressure and dull back pain; and leaking of amniotic fluid. This information is not intended to replace advice given to you by your health  care provider. Make sure you discuss any questions you have with your health care provider. Document Released: 11/07/2001 Document Revised: 03/06/2019 Document Reviewed: 12/19/2016 Elsevier Patient Education  2020 [employer]  

## 2019-09-17 NOTE — Progress Notes (Signed)
Subjective:  Carrie Hampton is a 28 y.o. G6P0040 at [redacted]w[redacted]d being seen today for ongoing prenatal care.  She is currently monitored for the following issues for this high-risk pregnancy and has Supervision of high-risk pregnancy; History of cervical incompetence; History of pre-eclampsia in prior pregnancy, currently pregnant; Cervical cerclage suture present; Transient hypertension; and Marginal insertion of umbilical cord affecting management of mother on their problem list.  Patient reports general discomforts of pregnancy.  Contractions: Irritability. Vag. Bleeding: None.  Movement: Present. Denies leaking of fluid.   The following portions of the patient's history were reviewed and updated as appropriate: allergies, current medications, past family history, past medical history, past social history, past surgical history and problem list. Problem list updated.  Objective:   Vitals:   09/17/19 0854  BP: 107/73  Pulse: 92  Weight: 133 lb (60.3 kg)    Fetal Status: Fetal Heart Rate (bpm): 140   Movement: Present     General:  Alert, oriented and cooperative. Patient is in no acute distress.  Skin: Skin is warm and dry. No rash noted.   Cardiovascular: Normal heart rate noted  Respiratory: Normal respiratory effort, no problems with respiration noted  Abdomen: Soft, gravid, appropriate for gestational age. Pain/Pressure: Present     Pelvic:  Cervical exam deferred        Extremities: Normal range of motion.  Edema: None  Mental Status: Normal mood and affect. Normal behavior. Normal judgment and thought content.   Urinalysis:      Assessment and Plan:  Pregnancy: Q6P6195 at [redacted]w[redacted]d  1. Supervision of high risk pregnancy in third trimester Stable  2. History of cervical incompetence Cerclage in place  3. Cervical cerclage suture present in third trimester As above Removal at 36 weeks  4. History of pre-eclampsia in prior pregnancy, currently pregnant No S/Sx presently  5.  Marginal insertion of umbilical cord affecting management of mother Normal growth on last growth scan  Follow growth scan scheduled  Preterm labor symptoms and general obstetric precautions including but not limited to vaginal bleeding, contractions, leaking of fluid and fetal movement were reviewed in detail with the patient. Please refer to After Visit Summary for other counseling recommendations.  Return in about 2 weeks (around 10/01/2019) for OB visit, face to face.   Chancy Milroy, MD

## 2019-09-23 ENCOUNTER — Ambulatory Visit (HOSPITAL_COMMUNITY): Payer: Medicaid Other | Admitting: *Deleted

## 2019-09-23 ENCOUNTER — Encounter (HOSPITAL_COMMUNITY): Payer: Self-pay

## 2019-09-23 ENCOUNTER — Ambulatory Visit (HOSPITAL_COMMUNITY)
Admission: RE | Admit: 2019-09-23 | Discharge: 2019-09-23 | Disposition: A | Discharge status: Home / Self Care | Payer: Medicaid Other | Source: Ambulatory Visit | Attending: Obstetrics | Admitting: Obstetrics

## 2019-09-23 ENCOUNTER — Other Ambulatory Visit: Payer: Self-pay

## 2019-09-23 DIAGNOSIS — O10919 Unspecified pre-existing hypertension complicating pregnancy, unspecified trimester: Secondary | ICD-10-CM | POA: Insufficient documentation

## 2019-09-23 DIAGNOSIS — Z362 Encounter for other antenatal screening follow-up: Secondary | ICD-10-CM | POA: Diagnosis not present

## 2019-09-23 DIAGNOSIS — O09213 Supervision of pregnancy with history of pre-term labor, third trimester: Secondary | ICD-10-CM

## 2019-09-23 DIAGNOSIS — O43199 Other malformation of placenta, unspecified trimester: Secondary | ICD-10-CM

## 2019-09-23 DIAGNOSIS — O3433 Maternal care for cervical incompetence, third trimester: Secondary | ICD-10-CM

## 2019-09-23 DIAGNOSIS — O0993 Supervision of high risk pregnancy, unspecified, third trimester: Secondary | ICD-10-CM | POA: Insufficient documentation

## 2019-09-23 DIAGNOSIS — O09293 Supervision of pregnancy with other poor reproductive or obstetric history, third trimester: Secondary | ICD-10-CM

## 2019-09-23 DIAGNOSIS — O09299 Supervision of pregnancy with other poor reproductive or obstetric history, unspecified trimester: Secondary | ICD-10-CM | POA: Insufficient documentation

## 2019-09-23 DIAGNOSIS — Z3A34 34 weeks gestation of pregnancy: Secondary | ICD-10-CM

## 2019-10-01 ENCOUNTER — Ambulatory Visit (INDEPENDENT_AMBULATORY_CARE_PROVIDER_SITE_OTHER): Payer: Medicaid Other | Admitting: Obstetrics and Gynecology

## 2019-10-01 ENCOUNTER — Other Ambulatory Visit (HOSPITAL_COMMUNITY)
Admission: RE | Admit: 2019-10-01 | Discharge: 2019-10-01 | Disposition: A | Discharge status: Home / Self Care | Payer: Medicaid Other | Source: Ambulatory Visit | Attending: Obstetrics and Gynecology | Admitting: Obstetrics and Gynecology

## 2019-10-01 ENCOUNTER — Encounter: Payer: Self-pay | Admitting: Obstetrics and Gynecology

## 2019-10-01 ENCOUNTER — Other Ambulatory Visit: Payer: Self-pay

## 2019-10-01 ENCOUNTER — Encounter: Payer: Medicaid Other | Admitting: Obstetrics and Gynecology

## 2019-10-01 VITALS — BP 106/69 | HR 82 | Wt 138.0 lb

## 2019-10-01 DIAGNOSIS — R030 Elevated blood-pressure reading, without diagnosis of hypertension: Secondary | ICD-10-CM

## 2019-10-01 DIAGNOSIS — N898 Other specified noninflammatory disorders of vagina: Secondary | ICD-10-CM | POA: Insufficient documentation

## 2019-10-01 DIAGNOSIS — O09293 Supervision of pregnancy with other poor reproductive or obstetric history, third trimester: Secondary | ICD-10-CM

## 2019-10-01 DIAGNOSIS — O43199 Other malformation of placenta, unspecified trimester: Secondary | ICD-10-CM

## 2019-10-01 DIAGNOSIS — O3433 Maternal care for cervical incompetence, third trimester: Secondary | ICD-10-CM

## 2019-10-01 DIAGNOSIS — O0993 Supervision of high risk pregnancy, unspecified, third trimester: Secondary | ICD-10-CM

## 2019-10-01 DIAGNOSIS — O43193 Other malformation of placenta, third trimester: Secondary | ICD-10-CM

## 2019-10-01 DIAGNOSIS — O09299 Supervision of pregnancy with other poor reproductive or obstetric history, unspecified trimester: Secondary | ICD-10-CM

## 2019-10-01 DIAGNOSIS — Z3A35 35 weeks gestation of pregnancy: Secondary | ICD-10-CM

## 2019-10-01 DIAGNOSIS — Z8742 Personal history of other diseases of the female genital tract: Secondary | ICD-10-CM

## 2019-10-01 NOTE — Progress Notes (Signed)
   PRENATAL VISIT NOTE  Subjective:  Carrie Hampton is a 28 y.o. [id number] at [redacted]w[redacted]d being seen today for ongoing prenatal care.  She is currently monitored for the following issues for this high-risk pregnancy and has Supervision of high-risk pregnancy; History of cervical incompetence; History of pre-eclampsia in prior pregnancy, currently pregnant; Cervical cerclage suture present; Transient hypertension; and Marginal insertion of umbilical cord affecting management of mother on their problem list.  Patient reports occasional contractions. Reports vaginal pressure, intermittent. Occasionally more present. Contractions: Irritability. Vag. Bleeding: Scant.  Movement: Present. Denies leaking of fluid.   The following portions of the patient's history were reviewed and updated as appropriate: allergies, current medications, past family history, past medical history, past social history, past surgical history and problem list.   Objective:   Vitals:   10/01/19 1003  BP: 106/69  Pulse: 82  Weight: 138 lb (62.6 kg)    Fetal Status: Fetal Heart Rate (bpm): 141   Movement: Present     General:  Alert, oriented and cooperative. Patient is in no acute distress.  Skin: Skin is warm and dry. No rash noted.   Cardiovascular: Normal heart rate noted  Respiratory: Normal respiratory effort, no problems with respiration noted  Abdomen: Soft, gravid, appropriate for gestational age.  Pain/Pressure: Present     Pelvic: cerclage intact, cervix visually closed, thick yellow discharge in vagina        Extremities: Normal range of motion.  Edema: Trace  Mental Status: Normal mood and affect. Normal behavior. Normal judgment and thought content.   Assessment and Plan:  Pregnancy: [id number] at [redacted]w[redacted]d  1. Supervision of high risk pregnancy in third trimester  2. Marginal insertion of umbilical cord affecting management of mother Last growth 59th%tile  3. History of pre-eclampsia in prior pregnancy,  currently pregnant Not taking baby ASA  4. Cervical cerclage suture present in third trimester  5. History of cervical incompetence Cerclage in place Remove next week  6. Transient hypertension Normotensive today  7. Vaginal discharge Wet prep today   Preterm labor symptoms and general obstetric precautions including but not limited to vaginal bleeding, contractions, leaking of fluid and fetal movement were reviewed in detail with the patient. Please refer to After Visit Summary for other counseling recommendations.   Return in about 1 week (around 10/08/2019) for high OB, in person, cerclage removal.  Future Appointments  Date Time Provider [place]  10/09/2019  4:00 PM Constant, Vickii Chafe, MD [facility] None    Sloan Leiter, MD

## 2019-10-02 LAB — CERVICOVAGINAL ANCILLARY ONLY
Bacterial Vaginitis (gardnerella): POSITIVE — AB
Candida Glabrata: NEGATIVE
Candida Vaginitis: NEGATIVE
Chlamydia: NEGATIVE
Comment: NEGATIVE
Comment: NEGATIVE
Comment: NEGATIVE
Comment: NEGATIVE
Comment: NEGATIVE
Comment: NORMAL
Neisseria Gonorrhea: NEGATIVE
Trichomonas: NEGATIVE

## 2019-10-04 MED ORDER — METRONIDAZOLE 500 MG PO TABS: 500.0000 mg | ORAL_TABLET | Freq: Two times a day (BID) | ORAL | 0 refills | Status: DC

## 2019-10-04 NOTE — Addendum Note (Signed)
Addended by: Vivien Rota on: 10/04/2019 08:45 PM   Modules accepted: Orders

## 2019-10-07 ENCOUNTER — Encounter: Payer: Medicaid Other | Admitting: Obstetrics and Gynecology

## 2019-10-08 ENCOUNTER — Encounter: Payer: Self-pay | Admitting: Obstetrics and Gynecology

## 2019-10-08 ENCOUNTER — Other Ambulatory Visit: Payer: Self-pay

## 2019-10-08 ENCOUNTER — Ambulatory Visit (INDEPENDENT_AMBULATORY_CARE_PROVIDER_SITE_OTHER): Payer: Medicaid Other | Admitting: Obstetrics and Gynecology

## 2019-10-08 VITALS — BP 112/75 | HR 89 | Wt 139.5 lb

## 2019-10-08 DIAGNOSIS — O0993 Supervision of high risk pregnancy, unspecified, third trimester: Secondary | ICD-10-CM

## 2019-10-08 DIAGNOSIS — O09293 Supervision of pregnancy with other poor reproductive or obstetric history, third trimester: Secondary | ICD-10-CM | POA: Diagnosis not present

## 2019-10-08 DIAGNOSIS — O09299 Supervision of pregnancy with other poor reproductive or obstetric history, unspecified trimester: Secondary | ICD-10-CM

## 2019-10-08 DIAGNOSIS — Z3A36 36 weeks gestation of pregnancy: Secondary | ICD-10-CM | POA: Diagnosis not present

## 2019-10-08 DIAGNOSIS — Z8742 Personal history of other diseases of the female genital tract: Secondary | ICD-10-CM

## 2019-10-08 DIAGNOSIS — O3433 Maternal care for cervical incompetence, third trimester: Secondary | ICD-10-CM | POA: Diagnosis not present

## 2019-10-08 NOTE — Patient Instructions (Signed)
Third Trimester of Pregnancy The third trimester is from week 28 through week 40 (months 7 through 9). The third trimester is a time when the unborn baby (fetus) is growing rapidly. At the end of the ninth month, the fetus is about 20 inches in length and weighs 6-10 pounds. Body changes during your third trimester Your body will continue to go through many changes during pregnancy. The changes vary from woman to woman. During the third trimester:  Your weight will continue to increase. You can expect to gain 25-35 pounds (11-16 kg) by the end of the pregnancy.  You may begin to get stretch marks on your hips, abdomen, and breasts.  You may urinate more often because the fetus is moving lower into your pelvis and pressing on your bladder.  You may develop or continue to have heartburn. This is caused by increased hormones that slow down muscles in the digestive tract.  You may develop or continue to have constipation because increased hormones slow digestion and cause the muscles that push waste through your intestines to relax.  You may develop hemorrhoids. These are swollen veins (varicose veins) in the rectum that can itch or be painful.  You may develop swollen, bulging veins (varicose veins) in your legs.  You may have increased body aches in the pelvis, back, or thighs. This is due to weight gain and increased hormones that are relaxing your joints.  You may have changes in your hair. These can include thickening of your hair, rapid growth, and changes in texture. Some women also have hair loss during or after pregnancy, or hair that feels dry or thin. Your hair will most likely return to normal after your baby is born.  Your breasts will continue to grow and they will continue to become tender. A yellow fluid (colostrum) may leak from your breasts. This is the first milk you are producing for your baby.  Your belly button may stick out.  You may notice more swelling in your hands,  face, or ankles.  You may have increased tingling or numbness in your hands, arms, and legs. The skin on your belly may also feel numb.  You may feel short of breath because of your expanding uterus.  You may have more problems sleeping. This can be caused by the size of your belly, increased need to urinate, and an increase in your body's metabolism.  You may notice the fetus "dropping," or moving lower in your abdomen (lightening).  You may have increased vaginal discharge.  You may notice your joints feel loose and you may have pain around your pelvic bone. What to expect at prenatal visits You will have prenatal exams every 2 weeks until week 36. Then you will have weekly prenatal exams. During a routine prenatal visit:  You will be weighed to make sure you and the baby are growing normally.  Your blood pressure will be taken.  Your abdomen will be measured to track your baby's growth.  The fetal heartbeat will be listened to.  Any test results from the previous visit will be discussed.  You may have a cervical check near your due date to see if your cervix has softened or thinned (effaced).  You will be tested for Group B streptococcus. This happens between 35 and 37 weeks. Your health care provider may ask you:  What your birth plan is.  How you are feeling.  If you are feeling the baby move.  If you have had any abnormal   symptoms, such as leaking fluid, bleeding, severe headaches, or abdominal cramping.  If you are using any tobacco products, including cigarettes, chewing tobacco, and electronic cigarettes.  If you have any questions. Other tests or screenings that may be performed during your third trimester include:  Blood tests that check for low iron levels (anemia).  Fetal testing to check the health, activity level, and growth of the fetus. Testing is done if you have certain medical conditions or if there are problems during the pregnancy.  Nonstress test  (NST). This test checks the health of your baby to make sure there are no signs of problems, such as the baby not getting enough oxygen. During this test, a belt is placed around your belly. The baby is made to move, and its heart rate is monitored during movement. What is false labor? False labor is a condition in which you feel small, irregular tightenings of the muscles in the womb (contractions) that usually go away with rest, changing position, or drinking water. These are called Braxton Hicks contractions. Contractions may last for hours, days, or even weeks before true labor sets in. If contractions come at regular intervals, become more frequent, increase in intensity, or become painful, you should see your health care provider. What are the signs of labor?  Abdominal cramps.  Regular contractions that start at 10 minutes apart and become stronger and more frequent with time.  Contractions that start on the top of the uterus and spread down to the lower abdomen and back.  Increased pelvic pressure and dull back pain.  A watery or bloody mucus discharge that comes from the vagina.  Leaking of amniotic fluid. This is also known as your "water breaking." It could be a slow trickle or a gush. Let your health care provider know if it has a color or strange odor. If you have any of these signs, call your health care provider right away, even if it is before your due date. Follow these instructions at home: Medicines  Follow your health care provider's instructions regarding medicine use. Specific medicines may be either safe or unsafe to take during pregnancy.  Take a prenatal vitamin that contains at least 600 micrograms (mcg) of folic acid.  If you develop constipation, try taking a stool softener if your health care provider approves. Eating and drinking   Eat a balanced diet that includes fresh fruits and vegetables, whole grains, good sources of protein such as meat, eggs, or tofu,  and low-fat dairy. Your health care provider will help you determine the amount of weight gain that is right for you.  Avoid raw meat and uncooked cheese. These carry germs that can cause birth defects in the baby.  If you have low calcium intake from food, talk to your health care provider about whether you should take a daily calcium supplement.  Eat four or five small meals rather than three large meals a day.  Limit foods that are high in fat and processed sugars, such as fried and sweet foods.  To prevent constipation: ? Drink enough fluid to keep your urine clear or pale yellow. ? Eat foods that are high in fiber, such as fresh fruits and vegetables, whole grains, and beans. Activity  Exercise only as directed by your health care provider. Most women can continue their usual exercise routine during pregnancy. Try to exercise for 30 minutes at least 5 days a week. Stop exercising if you experience uterine contractions.  Avoid heavy lifting.  Do   not exercise in extreme heat or humidity, or at high altitudes.  Wear low-heel, comfortable shoes.  Practice good posture.  You may continue to have sex unless your health care provider tells you otherwise. Relieving pain and discomfort  Take frequent breaks and rest with your legs elevated if you have leg cramps or low back pain.  Take warm sitz baths to soothe any pain or discomfort caused by hemorrhoids. Use hemorrhoid cream if your health care provider approves.  Wear a good support bra to prevent discomfort from breast tenderness.  If you develop varicose veins: ? Wear support pantyhose or compression stockings as told by your healthcare provider. ? Elevate your feet for 15 minutes, 3-4 times a day. Prenatal care  Write down your questions. Take them to your prenatal visits.  Keep all your prenatal visits as told by your health care provider. This is important. Safety  Wear your seat belt at all times when driving.  Make  a list of emergency phone numbers, including numbers for family, friends, the hospital, and police and fire departments. General instructions  Avoid cat litter boxes and soil used by cats. These carry germs that can cause birth defects in the baby. If you have a cat, ask someone to clean the litter box for you.  Do not travel far distances unless it is absolutely necessary and only with the approval of your health care provider.  Do not use hot tubs, steam rooms, or saunas.  Do not drink alcohol.  Do not use any products that contain nicotine or tobacco, such as cigarettes and e-cigarettes. If you need help quitting, ask your health care provider.  Do not use any medicinal herbs or unprescribed drugs. These chemicals affect the formation and growth of the baby.  Do not douche or use tampons or scented sanitary pads.  Do not cross your legs for long periods of time.  To prepare for the arrival of your baby: ? Take prenatal classes to understand, practice, and ask questions about labor and delivery. ? Make a trial run to the hospital. ? Visit the hospital and tour the maternity area. ? Arrange for maternity or paternity leave through employers. ? Arrange for family and friends to take care of pets while you are in the hospital. ? Purchase a rear-facing car seat and make sure you know how to install it in your car. ? Pack your hospital bag. ? Prepare the baby's nursery. Make sure to remove all pillows and stuffed animals from the baby's crib to prevent suffocation.  Visit your dentist if you have not gone during your pregnancy. Use a soft toothbrush to brush your teeth and be gentle when you floss. Contact a health care provider if:  You are unsure if you are in labor or if your water has broken.  You become dizzy.  You have mild pelvic cramps, pelvic pressure, or nagging pain in your abdominal area.  You have lower back pain.  You have persistent nausea, vomiting, or diarrhea.   You have an unusual or bad smelling vaginal discharge.  You have pain when you urinate. Get help right away if:  Your water breaks before 37 weeks.  You have regular contractions less than 5 minutes apart before 37 weeks.  You have a fever.  You are leaking fluid from your vagina.  You have spotting or bleeding from your vagina.  You have severe abdominal pain or cramping.  You have rapid weight loss or weight gain.  You have   shortness of breath with chest pain.  You notice sudden or extreme swelling of your face, hands, ankles, feet, or legs.  Your baby makes fewer than 10 movements in 2 hours.  You have severe headaches that do not go away when you take medicine.  You have vision changes. Summary  The third trimester is from week 28 through week 40, months 7 through 9. The third trimester is a time when the unborn baby (fetus) is growing rapidly.  During the third trimester, your discomfort may increase as you and your baby continue to gain weight. You may have abdominal, leg, and back pain, sleeping problems, and an increased need to urinate.  During the third trimester your breasts will keep growing and they will continue to become tender. A yellow fluid (colostrum) may leak from your breasts. This is the first milk you are producing for your baby.  False labor is a condition in which you feel small, irregular tightenings of the muscles in the womb (contractions) that eventually go away. These are called Braxton Hicks contractions. Contractions may last for hours, days, or even weeks before true labor sets in.  Signs of labor can include: abdominal cramps; regular contractions that start at 10 minutes apart and become stronger and more frequent with time; watery or bloody mucus discharge that comes from the vagina; increased pelvic pressure and dull back pain; and leaking of amniotic fluid. This information is not intended to replace advice given to you by your health  care provider. Make sure you discuss any questions you have with your health care provider. Document Released: 11/07/2001 Document Revised: 03/06/2019 Document Reviewed: 12/19/2016 Elsevier Patient Education  2020 [employer]  

## 2019-10-08 NOTE — Progress Notes (Signed)
Pt is here for ROB and cerclage removal. [redacted]w[redacted]d.

## 2019-10-08 NOTE — Progress Notes (Signed)
Subjective:  Carrie Hampton is a 28 y.o. G6P0040 at [redacted]w[redacted]d being seen today for ongoing prenatal care.  She is currently monitored for the following issues for this high-risk pregnancy and has Supervision of high-risk pregnancy; History of cervical incompetence; History of pre-eclampsia in prior pregnancy, currently pregnant; Cervical cerclage suture present; Transient hypertension; and Marginal insertion of umbilical cord affecting management of mother on their problem list.  Patient reports general discomforts of pregnancy.  Contractions: Irritability. Vag. Bleeding: None.  Movement: Present. Denies leaking of fluid.   The following portions of the patient's history were reviewed and updated as appropriate: allergies, current medications, past family history, past medical history, past social history, past surgical history and problem list. Problem list updated.  Objective:   Vitals:   10/08/19 0959  BP: 112/75  Pulse: 89  Weight: 139 lb 8 oz (63.3 kg)    Fetal Status: Fetal Heart Rate (bpm): 145   Movement: Present     General:  Alert, oriented and cooperative. Patient is in no acute distress.  Skin: Skin is warm and dry. No rash noted.   Cardiovascular: Normal heart rate noted  Respiratory: Normal respiratory effort, no problems with respiration noted  Abdomen: Soft, gravid, appropriate for gestational age. Pain/Pressure: Present     Pelvic:  Cervical exam performed        Extremities: Normal range of motion.  Edema: Trace  Mental Status: Normal mood and affect. Normal behavior. Normal judgment and thought content.    Procedure Cerclage removal  Speculum placed, Cerclage visualized and grasped with ring forceps. Knot cut with scissors and entire cerclage removed without problems Speculum removed Pt tolerated procedure well.   Urinalysis:      Assessment and Plan:  Pregnancy: G6P0040 at [redacted]w[redacted]d  1. Supervision of high risk pregnancy in third trimester Stable Labor  precatuions - GBS  2. History of cervical incompetence Cerclage removed today without problems  3. Cervical cerclage suture present in third trimester As above   4. History of pre-eclampsia in prior pregnancy, currently pregnant BP stable Not taking BASA Growth 59 % on 09/23/19  Preterm labor symptoms and general obstetric precautions including but not limited to vaginal bleeding, contractions, leaking of fluid and fetal movement were reviewed in detail with the patient. Please refer to After Visit Summary for other counseling recommendations.  Return in about 1 week (around 10/15/2019) for OB visit, face to face.   Chancy Milroy, MD

## 2019-10-09 ENCOUNTER — Encounter: Payer: Medicaid Other | Admitting: Obstetrics and Gynecology

## 2019-10-10 ENCOUNTER — Encounter: Payer: Self-pay | Admitting: Obstetrics and Gynecology

## 2019-10-10 DIAGNOSIS — O99820 Streptococcus B carrier state complicating pregnancy: Secondary | ICD-10-CM | POA: Insufficient documentation

## 2019-10-10 LAB — STREP GP B NAA: Strep Gp B NAA: POSITIVE — AB

## 2019-10-15 ENCOUNTER — Ambulatory Visit (INDEPENDENT_AMBULATORY_CARE_PROVIDER_SITE_OTHER): Payer: Medicaid Other | Admitting: Family Medicine

## 2019-10-15 ENCOUNTER — Encounter: Payer: Self-pay | Admitting: Family Medicine

## 2019-10-15 ENCOUNTER — Other Ambulatory Visit: Payer: Self-pay

## 2019-10-15 VITALS — BP 114/74 | HR 85 | Wt 139.0 lb

## 2019-10-15 DIAGNOSIS — Z3A37 37 weeks gestation of pregnancy: Secondary | ICD-10-CM

## 2019-10-15 DIAGNOSIS — O0993 Supervision of high risk pregnancy, unspecified, third trimester: Secondary | ICD-10-CM

## 2019-10-15 DIAGNOSIS — Z8742 Personal history of other diseases of the female genital tract: Secondary | ICD-10-CM

## 2019-10-15 NOTE — Progress Notes (Signed)
   PRENATAL VISIT NOTE  Subjective:  Carrie Hampton is a 28 y.o. G6P0140 at [redacted]w[redacted]d being seen today for ongoing prenatal care.  She is currently monitored for the following issues for this high-risk pregnancy and has Supervision of high-risk pregnancy; History of cervical incompetence; History of pre-eclampsia in prior pregnancy, currently pregnant; Cervical cerclage suture present; Transient hypertension; Marginal insertion of umbilical cord affecting management of mother; and GBS (group B Streptococcus carrier), +RV culture, currently pregnant on their problem list.  Patient reports no complaints.  Contractions: Irregular. Vag. Bleeding: None.  Movement: Present. Denies leaking of fluid.   The following portions of the patient's history were reviewed and updated as appropriate: allergies, current medications, past family history, past medical history, past social history, past surgical history and problem list.   Objective:   Vitals:   10/15/19 1010  BP: 114/74  Pulse: 85  Weight: 139 lb (63 kg)    Fetal Status: Fetal Heart Rate (bpm): 135 Fundal Height: 36 cm Movement: Present  Presentation: Vertex  General:  Alert, oriented and cooperative. Patient is in no acute distress.  Skin: Skin is warm and dry. No rash noted.   Cardiovascular: Normal heart rate noted  Respiratory: Normal respiratory effort, no problems with respiration noted  Abdomen: Soft, gravid, appropriate for gestational age.  Pain/Pressure: Present     Pelvic: Cervical exam performed Dilation: Closed Effacement (%): Thick Station: Ballotable  Extremities: Normal range of motion.     Mental Status: Normal mood and affect. Normal behavior. Normal judgment and thought content.   Assessment and Plan:  Pregnancy: [id number] at [redacted]w[redacted]d Offie was seen today for routine prenatal visit.  Diagnoses and all orders for this visit:  Supervision of high risk pregnancy in third trimester - GBS positive; discussed treatment; no PCN  allergy - BP stable; hx of Pre-E on problem list; patient had 20w IUFD with elevated BP's - BP's normal this pregnancy - not taking ASA - RTC in 1 weeks  History of cervical incompetence - Cerclage removed last week  Term labor symptoms and general obstetric precautions including but not limited to vaginal bleeding, contractions, leaking of fluid and fetal movement were reviewed in detail with the patient. Please refer to After Visit Summary for other counseling recommendations.   Return in about 1 week (around 10/22/2019) for Appts already scheduled .  Future Appointments  Date Time Provider [place]  10/22/2019  9:15 AM Lavonia Drafts, MD [facility] None  10/29/2019  9:45 AM Constant, Vickii Chafe, MD [facility] None  11/03/2019 10:30 AM Sloan Leiter, MD [facility] None    Chauncey Mann, MD

## 2019-10-20 ENCOUNTER — Inpatient Hospital Stay (HOSPITAL_COMMUNITY): Payer: Medicaid Other | Admitting: Anesthesiology

## 2019-10-20 ENCOUNTER — Other Ambulatory Visit: Payer: Self-pay

## 2019-10-20 ENCOUNTER — Encounter (HOSPITAL_COMMUNITY): Payer: Self-pay

## 2019-10-20 ENCOUNTER — Inpatient Hospital Stay (HOSPITAL_COMMUNITY)
Admission: AD | Admit: 2019-10-20 | Discharge: 2019-10-23 | DRG: 807 | Disposition: A | Discharge status: Home / Self Care | Payer: Medicaid Other | Attending: Family Medicine | Admitting: Family Medicine

## 2019-10-20 DIAGNOSIS — Z3A38 38 weeks gestation of pregnancy: Secondary | ICD-10-CM | POA: Diagnosis not present

## 2019-10-20 DIAGNOSIS — Z20828 Contact with and (suspected) exposure to other viral communicable diseases: Secondary | ICD-10-CM | POA: Diagnosis present

## 2019-10-20 DIAGNOSIS — Z3689 Encounter for other specified antenatal screening: Secondary | ICD-10-CM

## 2019-10-20 DIAGNOSIS — O26893 Other specified pregnancy related conditions, third trimester: Secondary | ICD-10-CM | POA: Diagnosis present

## 2019-10-20 DIAGNOSIS — Z87891 Personal history of nicotine dependence: Secondary | ICD-10-CM | POA: Diagnosis not present

## 2019-10-20 DIAGNOSIS — O43123 Velamentous insertion of umbilical cord, third trimester: Secondary | ICD-10-CM | POA: Diagnosis present

## 2019-10-20 DIAGNOSIS — O09299 Supervision of pregnancy with other poor reproductive or obstetric history, unspecified trimester: Secondary | ICD-10-CM

## 2019-10-20 DIAGNOSIS — Z3A37 37 weeks gestation of pregnancy: Secondary | ICD-10-CM

## 2019-10-20 DIAGNOSIS — O99824 Streptococcus B carrier state complicating childbirth: Secondary | ICD-10-CM | POA: Diagnosis present

## 2019-10-20 LAB — CBC
HCT: 40.8 % (ref 36.0–46.0)
Hemoglobin: 13.6 g/dL (ref 12.0–15.0)
MCH: 27.6 pg (ref 26.0–34.0)
MCHC: 33.3 g/dL (ref 30.0–36.0)
MCV: 82.9 fL (ref 80.0–100.0)
Platelets: 155 10*3/uL (ref 150–400)
RBC: 4.92 MIL/uL (ref 3.87–5.11)
RDW: 13.1 % (ref 11.5–15.5)
WBC: 8.9 10*3/uL (ref 4.0–10.5)
nRBC: 0.2 % (ref 0.0–0.2)

## 2019-10-20 LAB — POCT FERN TEST: POCT Fern Test: POSITIVE

## 2019-10-20 LAB — SARS CORONAVIRUS 2 (TAT 6-24 HRS): SARS Coronavirus 2: NEGATIVE

## 2019-10-20 MED ORDER — PHENYLEPHRINE 40 MCG/ML (10ML) SYRINGE FOR IV PUSH (FOR BLOOD PRESSURE SUPPORT): 80.0000 ug | PREFILLED_SYRINGE | INTRAVENOUS | Status: DC | PRN

## 2019-10-20 MED ORDER — PHENYLEPHRINE 40 MCG/ML (10ML) SYRINGE FOR IV PUSH (FOR BLOOD PRESSURE SUPPORT)
80.0000 ug | PREFILLED_SYRINGE | INTRAVENOUS | Status: DC | PRN
  Filled 2019-10-20: qty 10

## 2019-10-20 MED ORDER — ONDANSETRON HCL 4 MG/2ML IJ SOLN
4.0000 mg | Freq: Four times a day (QID) | INTRAMUSCULAR | Status: DC | PRN
  Administered 2019-10-20: 4 mg via INTRAVENOUS
  Filled 2019-10-20: qty 2

## 2019-10-20 MED ORDER — SODIUM CHLORIDE 0.9 % IV SOLN
5.0000 10*6.[IU] | Freq: Once | INTRAVENOUS | Status: AC
  Administered 2019-10-20: 18:00:00 5 10*6.[IU] via INTRAVENOUS
  Filled 2019-10-20: qty 5

## 2019-10-20 MED ORDER — PENICILLIN G POT IN DEXTROSE 60000 UNIT/ML IV SOLN
3.0000 10*6.[IU] | INTRAVENOUS | Status: DC
  Administered 2019-10-20 – 2019-10-21 (×4): 3 10*6.[IU] via INTRAVENOUS
  Filled 2019-10-20 (×4): qty 50

## 2019-10-20 MED ORDER — OXYTOCIN 40 UNITS IN NORMAL SALINE INFUSION - SIMPLE MED
1.0000 m[IU]/min | INTRAVENOUS | Status: DC
  Administered 2019-10-20: 2 m[IU]/min via INTRAVENOUS

## 2019-10-20 MED ORDER — ACETAMINOPHEN 325 MG PO TABS
650.0000 mg | ORAL_TABLET | ORAL | Status: DC | PRN
  Administered 2019-10-21: 15:00:00 650 mg via ORAL
  Filled 2019-10-20: qty 2

## 2019-10-20 MED ORDER — LACTATED RINGERS IV SOLN
500.0000 mL | INTRAVENOUS | Status: DC | PRN
  Administered 2019-10-21: 500 mL via INTRAVENOUS

## 2019-10-20 MED ORDER — TERBUTALINE SULFATE 1 MG/ML IJ SOLN: 0.2500 mg | Freq: Once | INTRAMUSCULAR | Status: DC | PRN

## 2019-10-20 MED ORDER — FENTANYL-BUPIVACAINE-NACL 0.5-0.125-0.9 MG/250ML-% EP SOLN
12.0000 mL/h | EPIDURAL | Status: DC | PRN
  Filled 2019-10-20: qty 250

## 2019-10-20 MED ORDER — LACTATED RINGERS IV SOLN: 500.0000 mL | Freq: Once | INTRAVENOUS | Status: DC

## 2019-10-20 MED ORDER — LACTATED RINGERS IV SOLN
INTRAVENOUS | Status: DC
  Administered 2019-10-20 – 2019-10-21 (×2): via INTRAVENOUS

## 2019-10-20 MED ORDER — FENTANYL CITRATE (PF) 100 MCG/2ML IJ SOLN
100.0000 ug | INTRAMUSCULAR | Status: DC | PRN
  Administered 2019-10-20: 100 ug via INTRAVENOUS

## 2019-10-20 MED ORDER — LIDOCAINE HCL (PF) 1 % IJ SOLN
INTRAMUSCULAR | Status: DC | PRN
  Administered 2019-10-20: 5 mL via EPIDURAL

## 2019-10-20 MED ORDER — OXYTOCIN BOLUS FROM INFUSION
500.0000 mL | Freq: Once | INTRAVENOUS | Status: AC
  Administered 2019-10-21: 14:00:00 500 mL via INTRAVENOUS

## 2019-10-20 MED ORDER — OXYTOCIN 40 UNITS IN NORMAL SALINE INFUSION - SIMPLE MED
2.5000 [IU]/h | INTRAVENOUS | Status: DC
  Filled 2019-10-20: qty 1000

## 2019-10-20 MED ORDER — DIPHENHYDRAMINE HCL 50 MG/ML IJ SOLN: 12.5000 mg | INTRAMUSCULAR | Status: DC | PRN

## 2019-10-20 MED ORDER — SOD CITRATE-CITRIC ACID 500-334 MG/5ML PO SOLN: 30.0000 mL | ORAL | Status: DC | PRN

## 2019-10-20 MED ORDER — EPHEDRINE 5 MG/ML INJ: 10.0000 mg | INTRAVENOUS | Status: DC | PRN

## 2019-10-20 MED ORDER — FENTANYL CITRATE (PF) 100 MCG/2ML IJ SOLN
INTRAMUSCULAR | Status: AC
  Filled 2019-10-20: qty 2

## 2019-10-20 MED ORDER — LIDOCAINE HCL (PF) 1 % IJ SOLN: 30.0000 mL | INTRAMUSCULAR | Status: DC | PRN

## 2019-10-20 MED ORDER — SODIUM CHLORIDE (PF) 0.9 % IJ SOLN
INTRAMUSCULAR | Status: DC | PRN
  Administered 2019-10-20: 12 mL/h via EPIDURAL

## 2019-10-20 NOTE — Anesthesia Procedure Notes (Signed)
Epidural Patient location during procedure: OB Start time: 10/20/2019 10:53 PM End time: 10/20/2019 11:07 PM  Staffing Anesthesiologist: Barnet Glasgow, MD Performed: anesthesiologist   Preanesthetic Checklist Completed: patient identified, site marked, surgical consent, pre-op evaluation, timeout performed, IV checked, risks and benefits discussed and monitors and equipment checked  Epidural Patient position: sitting Prep: site prepped and draped and DuraPrep Patient monitoring: continuous pulse ox and blood pressure Approach: midline Location: L3-L4 Injection technique: LOR air  Needle:  Needle type: Tuohy  Needle gauge: 17 G Needle length: 9 cm and 9 Needle insertion depth: 5 cm cm Catheter type: closed end flexible Catheter size: 19 Gauge Catheter at skin depth: 10 cm Test dose: negative  Assessment Events: blood not aspirated, injection not painful, no injection resistance, negative IV test and no paresthesia  Additional Notes Patient identified. Risks/Benefits/Options discussed with patient including but not limited to bleeding, infection, nerve damage, paralysis, failed block, incomplete pain control, headache, blood pressure changes, nausea, vomiting, reactions to medication both or allergic, itching and postpartum back pain. Confirmed with bedside nurse the patient's most recent platelet count. Confirmed with patient that they are not currently taking any anticoagulation, have any bleeding history or any family history of bleeding disorders. Patient expressed understanding and wished to proceed. All questions were answered. Sterile technique was used throughout the entire procedure. Please see nursing notes for vital signs. Test dose was given through epidural needle and negative prior to continuing to dose epidural or start infusion. Warning signs of high block given to the patient including shortness of breath, tingling/numbness in hands, complete motor block, or any  concerning symptoms with instructions to call for help. Patient was given instructions on fall risk and not to get out of bed. All questions and concerns addressed with instructions to call with any issues. 1 Attempt (S) . Patient tolerated procedure well.

## 2019-10-20 NOTE — MAU Provider Note (Signed)
History   [id number]   Chief Complaint  Patient presents with  . Rupture of Membranes    HPI Carrie Hampton is a 28 y.o. female  [id number] _0 .6 wks here with report of leaking clear fluid since 8pm last night.  Leaking of fluid has continued. Pt reports painful ctx x24 hrs. She denies vaginal bleeding. Last intercourse was not recent. She reports good fetal movement. All other systems negative.    Patient's last menstrual period was 01/06/2019.  OB History  Gravida Para Term Preterm AB Living  6 1 0 0 4 0  SAB TAB Ectopic Multiple Live Births  2 2 0 0 0    # Outcome Date GA Lbr Len/2nd Weight Sex Delivery Anes PTL Lv  6 Current           5 Para 11/03/17 [redacted]w[redacted]d 200 g M Vag-Spont   FD  4 SAB 2014          3 SAB 2013             Birth Comments: System Generated. Please review and update pregnancy details.  2 TAB           1 TAB             Past Medical History:  Diagnosis Date  . Hypertension   . Premature rupture of membranes 11/03/2017   Patient had PROM at 19 weeks.   . STD (female) 2013   Chlamydia    Family History  Problem Relation Age of Onset  . Alcohol abuse Neg Hx   . Arthritis Neg Hx   . Asthma Neg Hx   . Birth defects Neg Hx   . Cancer Neg Hx   . COPD Neg Hx   . Depression Neg Hx   . Diabetes Neg Hx   . Drug abuse Neg Hx   . Early death Neg Hx   . Hearing loss Neg Hx   . Heart disease Neg Hx   . Hyperlipidemia Neg Hx   . Hypertension Neg Hx   . Kidney disease Neg Hx   . Learning disabilities Neg Hx   . Mental illness Neg Hx   . Mental retardation Neg Hx   . Miscarriages / Stillbirths Neg Hx   . Stroke Neg Hx   . Vision loss Neg Hx     Social History   Socioeconomic History  . Marital status: Single    Spouse name: Not on file  . Number of children: Not on file  . Years of education: Not on file  . Highest education level: Not on file  Occupational History  . Not on file  Social Needs  . Financial resource strain: Not hard at all   . Food insecurity    Worry: Never true    Inability: Never true  . Transportation needs    Medical: No    Non-medical: Not on file  Tobacco Use  . Smoking status: Former Smoker    Packs/day: 1.00    Types: Cigars    Quit date: 10/20/2012    Years since quitting: 7.0  . Smokeless tobacco: Never Used  Substance and Sexual Activity  . Alcohol use: No    Comment: socially  . Drug use: No    Types: Marijuana    Comment: none per pt 03/24/19  . Sexual activity: Yes    Partners: Male    Birth control/protection: None    Comment: Pregnant   Lifestyle  . Physical  activity    Days per week: Not on file    Minutes per session: Not on file  . Stress: Only a little  Relationships  . Social Herbalist on phone: Not on file    Gets together: Not on file    Attends religious service: Not on file    Active member of club or organization: Not on file    Attends meetings of clubs or organizations: Not on file    Relationship status: Not on file  Other Topics Concern  . Not on file  Social History Narrative  . Not on file    No Known Allergies  No current facility-administered medications on file prior to encounter.    Current Outpatient Medications on File Prior to Encounter  Medication Sig Dispense Refill  . Prenatal Vit-Fe Fumarate-FA (PRENATAL VITAMIN PO) Take by mouth.    Marland Kitchen aspirin 81 MG chewable tablet Chew 1 tablet (81 mg total) by mouth daily. (Patient not taking: Reported on 07/22/2019) 90 tablet 1  . Blood Pressure Monitoring KIT 1 kit by Does not apply route once a week. 1 kit 0  . Elastic Bandages & Supports (COMFORT FIT MATERNITY SUPP SM) MISC Wear as directed. (Patient not taking: Reported on 09/03/2019) 1 each 0  . metroNIDAZOLE (FLAGYL) 500 MG tablet Take 1 tablet (500 mg total) by mouth 2 (two) times daily. (Patient not taking: Reported on 10/08/2019) 14 tablet 0  . promethazine (PHENERGAN) 25 MG tablet Take 25 mg by mouth every 6 (six) hours as needed for  nausea or vomiting.       Review of Systems  Gastrointestinal: Positive for abdominal pain (ctx).  Genitourinary: Positive for vaginal discharge. Negative for vaginal bleeding.     Physical Exam   Vitals:   10/20/19 1503 10/20/19 1506 10/20/19 1519  BP:  115/75 116/76  Pulse:  91 86  Resp:  16   Temp:  98.5 F (36.9 C)   SpO2:  99%   Weight: 62.8 kg      Physical Exam  Constitutional: She appears well-developed and well-nourished. No distress.  HENT:  Head: Normocephalic.  Cardiovascular: Normal rate.  Respiratory: Effort normal. No respiratory distress.  Genitourinary:    Genitourinary Comments: SSE: +pool, fern pos SVE: 5/80/-2   EFM: 135 bpm, mod variability, + accels, no decels Toco: 3-7  No results found for this or any previous visit (from the past 24 hour(s)).  MAU Course  Procedures  MDM SROM confirmed. Plan for admit.  Assessment and Plan  [redacted] weeks gestation Reactive NST Admit to LD Mngt per labor team   Julianne Handler, [place] 10/20/2019 3:49 PM

## 2019-10-20 NOTE — Progress Notes (Signed)
Carrie Hampton is a 28 y.o. G6P0040 at [redacted]w[redacted]d admitted for SOL/SROM. Questionable SROM around 2000 on 11/22.  Subjective: Feeling some pain with contractions, fentanyl helped a little. Feeling more back pain and some nausea.  Objective: BP 132/78   Pulse 80   Temp 98.5 F (36.9 C) (Oral)   Resp 18   Ht 5' 3.5" (1.613 m)   Wt 63 kg   LMP 01/06/2019   SpO2 99%   BMI 24.24 kg/m  No intake/output data recorded.  FHT:  FHR: 125 bpm, variability: moderate,  accelerations:  Present,  decelerations:  Absent UC:   irregular, every 2-5 minutes, difficult to pick up on TOCO  SVE:   Dilation: 5 Effacement (%): 80 Station: -2 Exam by:: Yoshimi Sarr, DO  Labs: Lab Results  Component Value Date   WBC 8.9 10/20/2019   HGB 13.6 10/20/2019   HCT 40.8 10/20/2019   MCV 82.9 10/20/2019   PLT 155 10/20/2019    Assessment / Plan: Carrie Hampton is a 28 y.o. G6P0040 at [redacted]w[redacted]d here for SOL/SROM. Questionable SROM around 2000 on 11/22.  #Labor: No cervical change since admission. Will augment with pitocin after epidural #Pain:  Patient wishes to get na epidural #FWB: Cat I; EFW: 3400g #ID:      GBS pos; s/p 2 doses PCN, continue per dept protocol #MOF: breast #MOC: condoms #Circ:   Outpatient  #Hx of Pre-E: Continues to be normotensive, labs as needed  Merilyn Baba DO OB Fellow, [facility] 10/20/2019, 10:39 PM

## 2019-10-20 NOTE — Anesthesia Preprocedure Evaluation (Signed)
Anesthesia Evaluation  Patient identified by MRN, date of birth, ID band Patient awake    Reviewed: Allergy & Precautions, NPO status , Patient's Chart, lab work & pertinent test results  Airway Mallampati: II  TM Distance: >3 FB Neck ROM: Full    Dental no notable dental hx. (+) Teeth Intact   Pulmonary neg pulmonary ROS, former smoker,    Pulmonary exam normal breath sounds clear to auscultation       Cardiovascular hypertension, negative cardio ROS Normal cardiovascular exam Rhythm:Regular Rate:Normal     Neuro/Psych negative neurological ROS  negative psych ROS   GI/Hepatic negative GI ROS, Neg liver ROS,   Endo/Other  negative endocrine ROS  Renal/GU negative Renal ROS     Musculoskeletal   Abdominal   Peds  Hematology Hgb 13.6 Plt 155   Anesthesia Other Findings   Reproductive/Obstetrics (+) Pregnancy                             Anesthesia Physical Anesthesia Plan  ASA: II  Anesthesia Plan: Epidural   Post-op Pain Management:    Induction:   PONV Risk Score and Plan:   Airway Management Planned:   Additional Equipment:   Intra-op Plan:   Post-operative Plan:   Informed Consent: I have reviewed the patients History and Physical, chart, labs and discussed the procedure including the risks, benefits and alternatives for the proposed anesthesia with the patient or authorized representative who has indicated his/her understanding and acceptance.       Plan Discussed with:   Anesthesia Plan Comments: (37 6/7 wk G6P1 for LEA)        Anesthesia Quick Evaluation

## 2019-10-20 NOTE — MAU Note (Signed)
Pt states that she has been having ctx's for the last 24 hours, but the ctx's have not been close. Pt reports ctx mostly every 10 minutes.   Pt reports she started leaking clear fluid at 2000 10/19/2019.   Pt reports random gushes of fluid since then, but is not continuous.   Denies vaginal bleeding, reports +FM

## 2019-10-20 NOTE — H&P (Addendum)
OBSTETRIC ADMISSION HISTORY AND PHYSICAL  Carrie Hampton is a 28 y.o. female G56P0040 with IUP at 62w6dby 8 wk [place] SR[place] and SOL. Questionable SROM since 11/22 around 2000 as patient reports leaking of fluid started at that time and found to be fern positive in MAU. She reports +Fms, no VB, no blurry vision, headaches or peripheral edema, and RUQ pain.  She plans on breast feeding. She requests condoms for birth control. [place]  Estimated Date of Delivery: 11/04/19  Sono: 09/23/2019   _0 , CWD, normal anatomy, cephalic presentation, posterior placental lie, 2446g, 59% EFW  Prenatal History/Complications: Hx of cervical incompetence; cerclage placement in this pregnancy with removal 11/11 Hx of IUFD at 20 weeks  Hx of Pre-E; patient had 20w IUFD with elevated BP's - has not been taking ASA GBS positive   Past Medical History: Past Medical History:  Diagnosis Date  . Hypertension   . Premature rupture of membranes 11/03/2017   Patient had PROM at 19 weeks.   . STD (female) 2013   Chlamydia    Past Surgical History: Past Surgical History:  Procedure Laterality Date  . CERVICAL CERCLAGE N/A 04/22/2019   Procedure: CERCLAGE CERVICAL;  Surgeon: PAletha Halim MD;  Location: [facility] LD ORS;  Service: Gynecology;  Laterality: N/A;  . TOOTH EXTRACTION      Obstetrical History: OB History    Gravida  6   Para  1   Term  0   Preterm  0   AB  4   Living  0     SAB  2   TAB  2   Ectopic  0   Multiple  0   Live Births  0           Social History: Social History   Socioeconomic History  . Marital status: Single    Spouse name: Not on file  . Number of children: Not on file  . Years of education: Not on file  . Highest education level: Not on file  Occupational History  . Not on file  Social Needs  . Financial resource strain: Not hard at all  . Food insecurity    Worry: Never true     Inability: Never true  . Transportation needs    Medical: No    Non-medical: Not on file  Tobacco Use  . Smoking status: Former Smoker    Packs/day: 1.00    Types: Cigars    Quit date: 10/20/2012    Years since quitting: 7.0  . Smokeless tobacco: Never Used  Substance and Sexual Activity  . Alcohol use: No    Comment: socially  . Drug use: No    Types: Marijuana    Comment: none per pt 03/24/19  . Sexual activity: Yes    Partners: Male    Birth control/protection: None    Comment: Pregnant   Lifestyle  . Physical activity    Days per week: Not on file    Minutes per session: Not on file  . Stress: Only a little  Relationships  . Social c[place]bal[place] phone: Not on file    Gets together: Not on file    Attends religious service: Not on file    Active member of club or organization: Not on file    Attends meetings of clubs or organizations: Not on file    Relationship status:  Not on file  Other Topics Concern  . Not on file  Social History Narrative  . Not on file    Family History: Family History  Problem Relation Age of Onset  . Alcohol abuse Neg Hx   . Arthritis Neg Hx   . Asthma Neg Hx   . Birth defects Neg Hx   . Cancer Neg Hx   . COPD Neg Hx   . Depression Neg Hx   . Diabetes Neg Hx   . Drug abuse Neg Hx   . Early death Neg Hx   . Hearing loss Neg Hx   . Heart disease Neg Hx   . Hyperlipidemia Neg Hx   . Hypertension Neg Hx   . Kidney disease Neg Hx   . Learning disabilities Neg Hx   . Mental illness Neg Hx   . Mental retardation Neg Hx   . Miscarriages / Stillbirths Neg Hx   . Stroke Neg Hx   . Vision loss Neg Hx     Allergies: No Known Allergies  Medications Prior to Admission  Medication Sig Dispense Refill Last Dose  . Prenatal Vit-Fe Fumarate-FA (PRENATAL VITAMIN PO) Take by mouth.   Past Week at Unknown time  . aspirin 81 MG chewable tablet Chew 1 tablet (81 mg total) by mouth daily. (Patient not taking: Reported on  07/22/2019) 90 tablet 1   . Blood Pressure Monitoring KIT 1 kit by Does not apply route once a week. 1 kit 0   . Elastic Bandages & Supports (COMFORT FIT MATERNITY SUPP SM) MISC Wear as directed. (Patient not taking: Reported on 09/03/2019) 1 each 0   . metroNIDAZOLE (FLAGYL) 500 MG tablet Take 1 tablet (500 mg total) by mouth 2 (two) times daily. (Patient not taking: Reported on 10/08/2019) 14 tablet 0   . promethazine (PHENERGAN) 25 MG tablet Take 25 mg by mouth every 6 (six) hours as needed for nausea or vomiting.        Review of Systems   All systems reviewed and negative except as stated in HPI  Blood pressure 132/82, pulse (!) 101, temperature 98.3 F (36.8 C), temperature source Oral, resp. rate 18, height 5' 3.5" (1.613 m), weight 63 kg, last menstrual period 01/06/2019, SpO2 99 %, unknown if currently breastfeeding. General appearance: alert, cooperative, appears stated age and no distress Lungs: normal effort Heart: regular rate  Abdomen: soft, non-tender; bowel sounds normal Pelvic: gravid uterus Extremities: Homans sign is negative, no sign of DVT Presentation: cephalic by CNM exam Fetal monitoringBaseline: 135 bpm, Variability: Good {> 6 bpm), Accelerations: Reactive and Decelerations: Absent Uterine activityFrequency: Every 10 minutes Dilation: 5 Effacement (%): 80 Station: -2 Exam by:: Julianne Handler, CNM   Prenatal labs: ABO, Rh: --/--/O POS, O POS (05/26 8546) Antibody: NEG (05/26 0721) Rubella: 5.92 (04/27 1025) RPR: Non Reactive (09/09 0919)  HBsAg: Negative (04/27 1025)  HIV: Non Reactive (09/09 0919)  GBS: --/Positive (11/11 1002)  2 hr Glucola WNL Genetic screening  Low risk female Anatomy US WNL  Prenatal Transfer Tool  Maternal Diabetes: No Genetic Screening: Normal Maternal Ultrasounds/Referrals: Normal Fetal Ultrasounds or other Referrals:  None Maternal Substance Abuse:  No Significant Maternal Medications:  None Significant Maternal Lab  Results: Group B Strep positive  Results for orders placed or performed during the hospital encounter of 10/20/19 (from the past 24 hour(s))  Fern Test   Collection Time: 10/20/19  4:58 PM  Result Value Ref Range   POCT Fern Test Positive = ruptured  amniotic membanes     Patient Active Problem List   Diagnosis Date Noted  . Labor and delivery, indication for care 10/20/2019  . GBS (group B Streptococcus carrier), +RV culture, currently pregnant 10/10/2019  . Marginal insertion of umbilical cord affecting management of mother 09/03/2019  . Cervical cerclage suture present 04/22/2019  . Transient hypertension 04/22/2019  . History of pre-eclampsia in prior pregnancy, currently pregnant 03/28/2019  . Supervision of high-risk pregnancy 03/24/2019  . History of cervical incompetence 03/24/2019    Assessment/Plan:  Carrie Hampton is a 28 y.o. G6P0040 at 66w6dhere for SOL/SROM. Questionable SROM around 2000 on 11/22.  #Labor: Presents at 5 cm. Vertex by CNM exam. Will not augment labor until at least 3-4 hours of receiving PCN. Expectant management at this time. Anticipate SVD. #Pain: Per patient request  #FWB: Cat I; EFW: 3400g #ID:  GBS pos; PCN ordered #MOF: breast #MOC: condoms #Circ:  Outpatient  #Hx of Pre-E: Serial BP's and labs as needed; normotensive thus far   CBarrington Ellison MD ONantucket Cottage HospitalFamily Medicine Fellow, FNorthwest [facility]for [facility] 10/20/2019, 5:33 PM

## 2019-10-21 ENCOUNTER — Encounter (HOSPITAL_COMMUNITY): Payer: Self-pay

## 2019-10-21 DIAGNOSIS — Z3A38 38 weeks gestation of pregnancy: Secondary | ICD-10-CM

## 2019-10-21 DIAGNOSIS — O99824 Streptococcus B carrier state complicating childbirth: Secondary | ICD-10-CM

## 2019-10-21 LAB — TYPE AND SCREEN
ABO/RH(D): O POS
Antibody Screen: NEGATIVE

## 2019-10-21 LAB — RPR: RPR Ser Ql: NONREACTIVE

## 2019-10-21 MED ORDER — SIMETHICONE 80 MG PO CHEW: 80.0000 mg | CHEWABLE_TABLET | ORAL | Status: DC | PRN

## 2019-10-21 MED ORDER — PRENATAL MULTIVITAMIN CH
1.0000 | ORAL_TABLET | Freq: Every day | ORAL | Status: DC
  Administered 2019-10-22 – 2019-10-23 (×2): 1 via ORAL
  Filled 2019-10-21 (×2): qty 1

## 2019-10-21 MED ORDER — ONDANSETRON HCL 4 MG PO TABS: 4.0000 mg | ORAL_TABLET | ORAL | Status: DC | PRN

## 2019-10-21 MED ORDER — SENNOSIDES-DOCUSATE SODIUM 8.6-50 MG PO TABS
2.0000 | ORAL_TABLET | ORAL | Status: DC
  Administered 2019-10-21 – 2019-10-23 (×2): 2 via ORAL
  Filled 2019-10-21: qty 2

## 2019-10-21 MED ORDER — BENZOCAINE-MENTHOL 20-0.5 % EX AERO: 1.0000 "application " | INHALATION_SPRAY | CUTANEOUS | Status: DC | PRN

## 2019-10-21 MED ORDER — TETANUS-DIPHTH-ACELL PERTUSSIS 5-2.5-18.5 LF-MCG/0.5 IM SUSP: 0.5000 mL | Freq: Once | INTRAMUSCULAR | Status: DC

## 2019-10-21 MED ORDER — IBUPROFEN 600 MG PO TABS
600.0000 mg | ORAL_TABLET | Freq: Four times a day (QID) | ORAL | Status: DC
  Administered 2019-10-21 – 2019-10-23 (×7): 600 mg via ORAL
  Filled 2019-10-21 (×6): qty 1

## 2019-10-21 MED ORDER — WITCH HAZEL-GLYCERIN EX PADS: 1.0000 "application " | MEDICATED_PAD | CUTANEOUS | Status: DC | PRN

## 2019-10-21 MED ORDER — COCONUT OIL OIL: 1.0000 "application " | TOPICAL_OIL | Status: DC | PRN

## 2019-10-21 MED ORDER — ACETAMINOPHEN 325 MG PO TABS
650.0000 mg | ORAL_TABLET | ORAL | Status: DC | PRN
  Administered 2019-10-22 – 2019-10-23 (×3): 650 mg via ORAL
  Filled 2019-10-21 (×4): qty 2

## 2019-10-21 MED ORDER — ONDANSETRON HCL 4 MG/2ML IJ SOLN: 4.0000 mg | INTRAMUSCULAR | Status: DC | PRN

## 2019-10-21 MED ORDER — DIBUCAINE (PERIANAL) 1 % EX OINT: 1.0000 "application " | TOPICAL_OINTMENT | CUTANEOUS | Status: DC | PRN

## 2019-10-21 NOTE — Progress Notes (Signed)
Carrie Hampton is a 28 y.o. G6P0040 at [redacted]w[redacted]d admitted for SOL/SROM. Questionable SROM around 2000 on 11/22.  Subjective: Comfortable with epidural, pitocin started @2341   Objective: BP 125/75   Pulse 72   Temp 98.4 F (36.9 C) (Oral)   Resp 16   Ht 5' 3.5" (1.613 m)   Wt 63 kg   LMP 01/06/2019   SpO2 99%   BMI 24.24 kg/m  No intake/output data recorded.  FHT:  FHR: 120 bpm, variability: moderate,  accelerations:  Present,  decelerations:  Absent UC:   irregular, every 2-5 minutes  SVE:   Dilation: 5 Effacement (%): 80 Station: -2 Exam by:: Janetta Hora, RN  Pitocin @ 4 mu/min  Labs: Lab Results  Component Value Date   WBC 8.9 10/20/2019   HGB 13.6 10/20/2019   HCT 40.8 10/20/2019   MCV 82.9 10/20/2019   PLT 155 10/20/2019    Assessment / Plan: Carrie S Turneris a 54 y.B.W3S9373 at [redacted]w[redacted]d here for SOL/SROM. Questionable SROM around 2000 on 11/22.  #Labor:Augmentation with pitocin, now at 4. #Pain:Epidural #FWB:Cat I; EFW:3400g #ID:GBS pos; s/p 2 doses PCN, continue per dept protocol #MOF:breast #MOC:condoms #Circ:Outpatient  #Hx of Pre-E: Continues to be normotensive, labs as indicated  Anticipate vaginal delivery  Merilyn Baba DO OB Fellow, [facility] 10/21/2019, 2:18 AM

## 2019-10-21 NOTE — Progress Notes (Signed)
While serving as on-call Chaplain, received a Code Apgar for Room 2-08.  Upon arrival spoke with Ms. Miltenberger's nurse who said the baby had stopped breathing momentarily but was everything was quickly remedied.  She did not think Ms. Finch needed to speak with a Chaplain at this time.  De Burrs Chaplain Resident

## 2019-10-21 NOTE — Discharge Summary (Signed)
Postpartum Discharge Summary     Patient Name: Carrie Hampton DOB: October 25, 1991 MRN: [id number]  Date of admission: 10/20/2019 Delivering Provider: Laury Deep   Date of discharge: 10/23/2019  Admitting diagnosis: 58WKS,CTX,LEAKING FLUID Intrauterine pregnancy: [redacted]w[redacted]d    Secondary diagnosis:  Active Problems:   History of pre-eclampsia in prior pregnancy, currently pregnant   Labor and delivery, indication for care   [redacted] weeks gestation of pregnancy  Additional problems: Incompetent cervix     Discharge diagnosis: Term Pregnancy Delivered                                                                                                Post partum procedures:none  Augmentation: Pitocin  Complications: None  Hospital course:  Onset of Labor With Vaginal Delivery     28y.o. yo [id number] 350w0das admitted in Latent Labor on 10/20/2019. Patient had an uncomplicated labor course as follows:  Membrane Rupture Time/Date: 8:00 PM ,10/19/2019   Intrapartum Procedures: Episiotomy: None [1]                                         Lacerations:  1st degree [2];Vaginal [6]  Patient had a delivery of a Viable infant. 10/21/2019  Information for the patient's newborn:  TuAria, Jarrard0[[id number]]Delivery Method: Vag-Spont     Pateint had an uncomplicated postpartum course. Declined birth control. DBP's slightly elevated on day of discharge and due to hx of pre-E, patient started on Norvasc 2.5 mg. BP check requested in 1 week. Patient to follow BP's at home. She is ambulating, tolerating a regular diet, passing flatus, and urinating well. Patient is discharged home in stable condition on 10/23/19.  Delivery time: 2:10 PM    Magnesium Sulfate received: No BMZ received: No Rhophylac:N/A MMR:No Transfusion:No  Physical exam  Vitals:   10/22/19 0510 10/22/19 1455 10/22/19 1951 10/23/19 0510  BP: 111/85 116/78 (!) 125/91 (!) 119/92  Pulse: 74 82 71   Resp: '18 16 17 16   ' Temp: 98.1 F (36.7 C) 98.2 F (36.8 C) 98.1 F (36.7 C) 98.1 F (36.7 C)  TempSrc: Oral Oral Oral Oral  SpO2: 100% 100% 100% 100%  Weight:      Height:       General: alert, cooperative and no distress Lochia: appropriate Uterine Fundus: firm Incision: N/A DVT Evaluation: No evidence of DVT seen on physical exam. Labs: Lab Results  Component Value Date   WBC 8.9 10/20/2019   HGB 13.6 10/20/2019   HCT 40.8 10/20/2019   MCV 82.9 10/20/2019   PLT 155 10/20/2019   CMP Latest Ref Rng & Units 08/02/2017  Glucose 65 - 99 mg/dL 74  BUN 6 - 20 mg/dL 6  Creatinine 0.44 - 1.00 mg/dL 0.66  Sodium 135 - 145 mmol/L 136  Potassium 3.5 - 5.1 mmol/L 3.2(L)  Chloride 101 - 111 mmol/L 104  CO2 22 - 32 mmol/L 23  Calcium 8.9 - 10.3 mg/dL 9.1  Total Protein 6.5 -  8.1 g/dL 6.7  Total Bilirubin 0.3 - 1.2 mg/dL 0.9  Alkaline Phos 38 - 126 U/L 54  AST 15 - 41 U/L 19  ALT 14 - 54 U/L 10(L)    Discharge instruction: per After Visit Summary and "Baby and Me Booklet".  After visit meds:  Allergies as of 10/23/2019   No Known Allergies     Medication List    STOP taking these medications   aspirin 81 MG chewable tablet   Blood Pressure Monitoring Kit   Comfort Fit Maternity Supp Sm Misc   metroNIDAZOLE 500 MG tablet Commonly known as: FLAGYL   promethazine 25 MG tablet Commonly known as: PHENERGAN     TAKE these medications   acetaminophen 325 MG tablet Commonly known as: Tylenol Take 2 tablets (650 mg total) by mouth every 6 (six) hours as needed (for pain scale < 4).   amLODipine 2.5 MG tablet Commonly known as: NORVASC Take 1 tablet (2.5 mg total) by mouth daily.   ibuprofen 600 MG tablet Commonly known as: ADVIL Take 1 tablet (600 mg total) by mouth every 6 (six) hours as needed.   PRENATAL VITAMIN PO Take by mouth.       Diet: routine diet  Activity: Advance as tolerated. Pelvic rest for 6 weeks.   Outpatient follow up:6 weeks Follow up Appt: Future  Appointments  Date Time Provider [place]  10/29/2019  9:45 AM Constant, Vickii Chafe, MD [place] None  11/03/2019 10:30 AM Sloan Leiter, MD [place] None   Follow up Visit: [place]. Schedule an appointment as soon as possible for a visit in 6 week(s).   Specialty: Obstetrics and Gynecology Why: For postpartum visit Contact information: [address], [place] [phone]           Please schedule this patient for Postpartum visit in: 6 weeks with the following provider: MD For C/S patients schedule nurse incision check in weeks 2 weeks: no High risk pregnancy complicated by: H/O IUFD, cervical incompetence Delivery mode:  SVD Anticipated Birth Control:  Condoms PP Procedures needed: none  Schedule Integrated BH visit: no   Newborn Data: Live born female "[place]" Birth Weight: 6 lb 12.6 oz (3080 g) APGAR: 4, 7  Newborn Delivery   Birth date/time: 10/21/2019 14:10:00 Delivery type: Vaginal, Spontaneous      Baby Feeding: Breast Disposition:home with mother   10/23/2019 Chauncey Mann, MD

## 2019-10-21 NOTE — Progress Notes (Signed)
Carrie Hampton is a 28 y.o. G6P0040 at [redacted]w[redacted]d admitted for SOL/SROM  Subjective: Feeling pressure.   Objective: BP (!) 137/100   Pulse (!) 122   Temp 98.1 F (36.7 C) (Oral)   Resp 18   Ht 5' 3.5" (1.613 m)   Wt 63 kg   LMP 01/06/2019   SpO2 100%   BMI 24.24 kg/m  Total I/O In: -  Out: 300 [Urine:300]  FHT:  FHR:125 bpm, variability: moderate,  accelerations:  Present,  decelerations:  Absent UC:   Moderate on Toco  SVE:   Dilation: Lip/rim Effacement (%): 90(100% pt left side; 80% pt right side) Station: -1 Exam by:: [employer] CNM   Labs: Lab Results  Component Value Date   WBC 8.9 10/20/2019   HGB 13.6 10/20/2019   HCT 40.8 10/20/2019   MCV 82.9 10/20/2019   PLT 155 10/20/2019    Assessment / Plan: Carrie Hampton is a 28 y.o G6P0040 at [redacted]w[redacted]d here for SOL/SROM  Labor: Progressing, Pitocin off at 0820 Fetal Wellbeing:  Category I Pain Control:  Epidural I/D:  GBS positive, PCN Anticipated MOD:  Vaginal Delivery  Carollee Leitz MD PGY1 [facility] 10/21/2019, 11:47 AM

## 2019-10-21 NOTE — Lactation Note (Signed)
This note was copied from a baby's chart. Lactation Consultation Note  Patient Name: Carrie Hampton [id number] Date: 10/21/2019 Reason for consult: Initial assessment;Early term 37-38.6wks;Primapara;1st time breastfeeding  P1 mother whose infant is now 75 hours old.  Mother was talking on her phone when I arrived.  She did not wish to be disturbed and can call for lactation at a more convenient time.  Brochure left at bedside.   Maternal Data    Feeding Feeding Type: Bottle Fed - Formula Nipple Type: Slow - flow  LATCH Score                   Interventions    Lactation Tools Discussed/Used     Consult Status Consult Status: Follow-up Date: 10/22/19 Follow-up type: In-patient    Carrie Hampton 10/21/2019, 8:28 PM

## 2019-10-21 NOTE — Progress Notes (Cosign Needed Addendum)
RONALEE SCHEUNEMANN is a 28 y.o. G6P0040 at [redacted]w[redacted]d admitted for SOL/SROM  Subjective: Comfortable with epidural in place.  No concerns voiced.  Objective: BP 126/76   Pulse 87   Temp 98.1 F (36.7 C) (Oral)   Resp 18   Ht 5' 3.5" (1.613 m)   Wt 63 kg   LMP 01/06/2019   SpO2 100%   BMI 24.24 kg/m  Total I/O In: -  Out: 300 [Urine:300]  FHT:  FHR: 125-130 bpm, variability: moderate,  accelerations:  Present,  decelerations:  Absent UC:   Moderate on Toco  SVE:   Dilation: 6.5 Effacement (%): 90(100% pt left side; 80% pt right side) Station: -1 Exam by:: Aron Baba RN    Labs: Lab Results  Component Value Date   WBC 8.9 10/20/2019   HGB 13.6 10/20/2019   HCT 40.8 10/20/2019   MCV 82.9 10/20/2019   PLT 155 10/20/2019    Assessment / Plan: YING BLANKENHORN is a 28 y.o G6P0040 at [redacted]w[redacted]d here for SOL/SROM  Labor: Progressing, Pitocin off at 0820 Fetal Wellbeing:  Category I Pain Control:  Epidural I/D:  GBS positive, PCN Anticipated MOD:  Vaginal Delivery  Carollee Leitz MD PGY1 [facility] 10/21/2019, 10:40 AM

## 2019-10-22 ENCOUNTER — Encounter: Payer: Medicaid Other | Admitting: Obstetrics & Gynecology

## 2019-10-22 NOTE — Progress Notes (Addendum)
POSTPARTUM PROGRESS NOTE  Subjective: Carrie Hampton is a 28 y.o. G6Y6948 s/p Vaginal Delivery at [redacted]w[redacted]d.  She reports she doing well. No acute events overnight. She denies any problems with ambulating, voiding or po intake. Denies nausea or vomiting. She has passed flatus. Pain is well controlled.  Lochia is appropriate.  Objective: Blood pressure 111/85, pulse 74, temperature 98.1 F (36.7 C), temperature source Oral, resp. rate 18, height 5' 3.5" (1.613 m), weight 63 kg, last menstrual period 01/06/2019, SpO2 100 %, unknown if currently breastfeeding.  Physical Exam:  General: alert, cooperative and no distress Chest: no respiratory distress Abdomen: soft, non-tender  Uterine Fundus: firm, appropriately tender Extremities: No calf swelling or tenderness  no edema  Recent Labs    10/20/19 1737  HGB 13.6  HCT 40.8    Assessment/Plan: Carrie Hampton is a 28 y.o. N4O2703 s/p Vaginal Delivery at [redacted]w[redacted]d for SOL/SROM.  Routine Postpartum Care: Doing well, pain well-controlled.  -- Continue routine care, lactation support  -- Contraception: condoms -- Feeding: breast  Dispo: Plan for discharge tomorrow.  Carollee Leitz MD [facility]   OB FELLOW POSTPARTUM PROGRESS NOTE ATTESTATION  I have seen and examined this patient and agree with above documentation in the resident's note.   Phill Myron, D.O. OB Fellow  10/22/2019, 11:36 AM

## 2019-10-22 NOTE — Anesthesia Postprocedure Evaluation (Signed)
Anesthesia Post Note  Patient: Carrie Hampton  Procedure(s) Performed: AN AD HOC LABOR EPIDURAL     Patient location during evaluation: Mother Baby Anesthesia Type: Epidural Level of consciousness: awake and alert Pain management: pain level controlled Vital Signs Assessment: post-procedure vital signs reviewed and stable Respiratory status: spontaneous breathing, nonlabored ventilation and respiratory function stable Cardiovascular status: stable Postop Assessment: no headache, no backache and epidural receding Anesthetic complications: no    Last Vitals:  Vitals:   10/22/19 0144 10/22/19 0510  BP: 104/73 111/85  Pulse: 76 74  Resp: 18 18  Temp: (!) 36.3 C 36.7 C  SpO2: 100% 100%    Last Pain:  Vitals:   10/22/19 0915  TempSrc:   PainSc: 0-No pain   Pain Goal: Patients Stated Pain Goal: 3 (10/21/19 1540)                 Drucie Opitz

## 2019-10-23 DIAGNOSIS — Z3A38 38 weeks gestation of pregnancy: Secondary | ICD-10-CM

## 2019-10-23 MED ORDER — AMLODIPINE BESYLATE 2.5 MG PO TABS: 2.5000 mg | ORAL_TABLET | Freq: Every day | ORAL | 0 refills | Status: DC

## 2019-10-23 MED ORDER — AMLODIPINE BESYLATE 5 MG PO TABS
2.5000 mg | ORAL_TABLET | Freq: Every day | ORAL | Status: DC
  Administered 2019-10-23: 10:00:00 2.5 mg via ORAL
  Filled 2019-10-23: qty 1

## 2019-10-23 MED ORDER — IBUPROFEN 600 MG PO TABS: 600.0000 mg | ORAL_TABLET | Freq: Four times a day (QID) | ORAL | 0 refills | Status: DC | PRN

## 2019-10-23 MED ORDER — ACETAMINOPHEN 325 MG PO TABS: 650.0000 mg | ORAL_TABLET | Freq: Four times a day (QID) | ORAL | 0 refills | Status: DC | PRN

## 2019-10-23 NOTE — Lactation Note (Signed)
This note was copied from a baby's chart. Lactation Consultation Note  Patient Name: Carrie Hampton [id number] Date: 10/23/2019 Reason for consult: Follow-up assessment   LC Follow Up Visit:  Reviewed chart to find that mother has only formula fed since her initial lactation consult.  Confirmed with mother that she has chosen to formula feed only and does not need lactation services.   Consult Status Consult Status: Complete Date: 10/23/19 Follow-up type: Call as needed    Lanice Schwab Jadyn Brasher 10/23/2019, 9:42 AM

## 2019-10-29 ENCOUNTER — Encounter: Payer: Medicaid Other | Admitting: Obstetrics and Gynecology

## 2019-10-30 ENCOUNTER — Telehealth: Payer: Self-pay

## 2019-10-30 ENCOUNTER — Ambulatory Visit: Payer: Medicaid Other

## 2019-10-30 NOTE — Telephone Encounter (Signed)
Patient no showed.  Spoke with patient, advised her to check her BP and call us with the readings. MyChart Activation code sent.

## 2019-11-03 ENCOUNTER — Encounter: Payer: Medicaid Other | Admitting: Obstetrics and Gynecology

## 2019-11-19 ENCOUNTER — Telehealth: Payer: Medicaid Other | Admitting: Obstetrics and Gynecology

## 2020-01-05 ENCOUNTER — Ambulatory Visit (HOSPITAL_COMMUNITY)
Admission: EM | Admit: 2020-01-05 | Discharge: 2020-01-05 | Disposition: A | Discharge status: Home / Self Care | Payer: Medicaid Other | Attending: Internal Medicine | Admitting: Internal Medicine

## 2020-01-05 ENCOUNTER — Other Ambulatory Visit: Payer: Self-pay

## 2020-01-05 ENCOUNTER — Encounter (HOSPITAL_COMMUNITY): Payer: Self-pay | Admitting: Emergency Medicine

## 2020-01-05 DIAGNOSIS — N760 Acute vaginitis: Secondary | ICD-10-CM | POA: Insufficient documentation

## 2020-01-05 DIAGNOSIS — Z202 Contact with and (suspected) exposure to infections with a predominantly sexual mode of transmission: Secondary | ICD-10-CM | POA: Insufficient documentation

## 2020-01-05 DIAGNOSIS — Z3202 Encounter for pregnancy test, result negative: Secondary | ICD-10-CM | POA: Diagnosis not present

## 2020-01-05 LAB — POC URINE PREG, ED: Preg Test, Ur: NEGATIVE

## 2020-01-05 LAB — POCT URINALYSIS DIP (DEVICE)
Bilirubin Urine: NEGATIVE
Glucose, UA: NEGATIVE mg/dL
Hgb urine dipstick: NEGATIVE
Ketones, ur: NEGATIVE mg/dL
Leukocytes,Ua: NEGATIVE
Nitrite: NEGATIVE
Protein, ur: NEGATIVE mg/dL
Specific Gravity, Urine: >=1.03 (ref 1.005–1.030)
Urobilinogen, UA: 0.2 mg/dL (ref 0.0–1.0)
pH: 6 (ref 5.0–8.0)

## 2020-01-05 LAB — HIV ANTIBODY (ROUTINE TESTING W REFLEX): HIV Screen 4th Generation wRfx: NONREACTIVE

## 2020-01-05 LAB — POCT PREGNANCY, URINE: Preg Test, Ur: NEGATIVE

## 2020-01-05 MED ORDER — CEFTRIAXONE SODIUM 500 MG IJ SOLR
500.0000 mg | Freq: Once | INTRAMUSCULAR | Status: AC
  Administered 2020-01-05: 500 mg via INTRAMUSCULAR

## 2020-01-05 MED ORDER — LIDOCAINE HCL (PF) 1 % IJ SOLN
INTRAMUSCULAR | Status: AC
  Filled 2020-01-05: qty 2

## 2020-01-05 MED ORDER — CEFTRIAXONE SODIUM 500 MG IJ SOLR
INTRAMUSCULAR | Status: AC
  Filled 2020-01-05: qty 500

## 2020-01-05 NOTE — ED Triage Notes (Signed)
Pt reports vaginal discharge that started one week ago.  Pt denies urinary issues or any back or abdominal pain.  Pt states her sex partner was here a day or two ago to be tested, with no results yet.  He received an injection and pills while here.

## 2020-01-05 NOTE — ED Provider Notes (Signed)
[facility]    CSN: [id number] Arrival date & time: 01/05/20  1055      History   Chief Complaint Chief Complaint  Patient presents with  . Exposure to STD  . Vaginal Discharge    HPI Carrie Hampton is a 29 y.o. female with history of hypertension comes to urgent care on account of vaginal discharge of 1 week duration.  Patient says vaginal discharge started a week ago and has been persistent.  She denies any dysuria urgency or frequency.  No fever or chills.  Patient sexual partner was evaluated for penile discharge yesterday.  He has been engaging in unprotected sexual activity with other females.  Patient denies any dyspareunia.  She had a baby 2 months ago and is currently breast-feeding. HPI  Past Medical History:  Diagnosis Date  . Hypertension   . Premature rupture of membranes 11/03/2017   Patient had PROM at 19 weeks.   . STD (female) 2013   Chlamydia    Patient Active Problem List   Diagnosis Date Noted  . [redacted] weeks gestation of pregnancy 10/23/2019  . Labor and delivery, indication for care 10/20/2019  . GBS (group B Streptococcus carrier), +RV culture, currently pregnant 10/10/2019  . Marginal insertion of umbilical cord affecting management of mother 09/03/2019  . Cervical cerclage suture present 04/22/2019  . Transient hypertension 04/22/2019  . History of pre-eclampsia in prior pregnancy, currently pregnant 03/28/2019  . Supervision of high-risk pregnancy 03/24/2019  . History of cervical incompetence 03/24/2019    Past Surgical History:  Procedure Laterality Date  . CERVICAL CERCLAGE N/A 04/22/2019   Procedure: CERCLAGE CERVICAL;  Surgeon: Aletha Halim, MD;  Location: [facility] LD ORS;  Service: Gynecology;  Laterality: N/A;  . TOOTH EXTRACTION      OB History    Gravida  6   Para  2   Term  1   Preterm  0   AB  4   Living  1     SAB  2   TAB  2   Ectopic  0   Multiple  0   Live Births  1            Home Medications     Prior to Admission medications   Medication Sig Start Date End Date Taking? Authorizing Provider  acetaminophen (TYLENOL) 325 MG tablet Take 2 tablets (650 mg total) by mouth every 6 (six) hours as needed (for pain scale < 4). 10/23/19   Fair, Marin Shutter, MD  ibuprofen (ADVIL) 600 MG tablet Take 1 tablet (600 mg total) by mouth every 6 (six) hours as needed. 10/23/19   FairMarin Shutter, MD  Prenatal Vit-Fe Fumarate-FA (PRENATAL VITAMIN PO) Take by mouth.    [provider]  amLODipine (NORVASC) 2.5 MG tablet Take 1 tablet (2.5 mg total) by mouth daily. 10/23/19 01/05/20  Chauncey Mann, MD    Family History Family History  Problem Relation Age of Onset  . Healthy Mother   . Healthy Father   . Alcohol abuse Neg Hx   . Arthritis Neg Hx   . Asthma Neg Hx   . Birth defects Neg Hx   . Cancer Neg Hx   . COPD Neg Hx   . Depression Neg Hx   . Diabetes Neg Hx   . Drug abuse Neg Hx   . Early death Neg Hx   . Hearing loss Neg Hx   . Heart disease Neg Hx   . Hyperlipidemia  Neg Hx   . Hypertension Neg Hx   . Kidney disease Neg Hx   . Learning disabilities Neg Hx   . Mental illness Neg Hx   . Mental retardation Neg Hx   . Miscarriages / Stillbirths Neg Hx   . Stroke Neg Hx   . Vision loss Neg Hx     Social History Social History   Tobacco Use  . Smoking status: Former Smoker    Packs/day: 1.00    Types: Cigars    Quit date: 10/20/2012    Years since quitting: 7.2  . Smokeless tobacco: Never Used  Substance Use Topics  . Alcohol use: No    Comment: socially  . Drug use: No    Types: Marijuana    Comment: none per pt 03/24/19     Allergies   Patient has no known allergies.   Review of Systems Review of Systems  Constitutional: Negative for activity change, chills, fatigue and fever.  HENT: Negative.   Respiratory: Negative for choking, shortness of breath and stridor.   Gastrointestinal: Negative.  Negative for abdominal pain, nausea and vomiting.    Genitourinary: Positive for vaginal discharge. Negative for dysuria, frequency, hematuria, pelvic pain, urgency and vaginal pain.  Musculoskeletal: Negative for arthralgias, gait problem and joint swelling.  Skin: Negative.   Neurological: Negative for dizziness, light-headedness and headaches.     Physical Exam Triage Vital Signs ED Triage Vitals [01/05/20 1113]  Enc Vitals Group     BP 119/79     Pulse Rate 80     Resp 14     Temp 98.3 F (36.8 C)     Temp Source Oral     SpO2 100 %     Weight      Height      Head Circumference      Peak Flow      Pain Score 0     Pain Loc      Pain Edu?      Excl. in GC?    No data found.  Updated Vital Signs BP 119/79 (BP Location: Right Arm)   Pulse 80   Temp 98.3 F (36.8 C) (Oral)   Resp 14   LMP 01/06/2019   SpO2 100%   Breastfeeding Yes   Visual Acuity Right Eye Distance:   Left Eye Distance:   Bilateral Distance:    Right Eye Near:   Left Eye Near:    Bilateral Near:     Physical Exam Vitals and nursing note reviewed.  Constitutional:      General: She is not in acute distress.    Appearance: She is not ill-appearing.  Cardiovascular:     Rate and Rhythm: Normal rate and regular rhythm.     Pulses: Normal pulses.     Heart sounds: Normal heart sounds. No murmur.  Pulmonary:     Effort: Pulmonary effort is normal. No respiratory distress.     Breath sounds: Normal breath sounds. No rhonchi.  Abdominal:     General: Abdomen is flat. Bowel sounds are normal. There is no distension.     Tenderness: There is no abdominal tenderness. There is no rebound.  Skin:    Capillary Refill: Capillary refill takes less than 2 seconds.  Neurological:     Mental Status: She is alert.      UC Treatments / Results  Labs (all labs ordered are listed, but only abnormal results are displayed) Labs Reviewed  HIV ANTIBODY (ROUTINE TESTING W  REFLEX)  RPR  POCT URINALYSIS DIP (DEVICE)  POC URINE PREG, ED  POCT  PREGNANCY, URINE  CERVICOVAGINAL ANCILLARY ONLY    EKG   Radiology No results found.  Procedures Procedures (including critical care time)  Medications Ordered in UC Medications  cefTRIAXone (ROCEPHIN) injection 500 mg (500 mg Intramuscular Given 01/05/20 1215)    Initial Impression / Assessment and Plan / UC Course  I have reviewed the triage vital signs and the nursing notes.  Pertinent labs & imaging results that were available during my care of the patient were reviewed by me and considered in my medical decision making (see chart for details).    1.  Acute vaginitis in the setting of STD exposure: Cervical vaginal swab for GC/chlamydia/trichomonas/bacterial vaginosis/yeast Ceftriaxone 500 mg IM x1 dose I will hold off starting doxycycline because the patient is currently breast-feeding her 23-month-old baby.  Patient has reliable cell phone number and she is eager to get treated if her lab work dictates that.  I therefore will hold off prescribing doxycycline at this time.  If patient is positive for chlamydia and or trichomonas, the treatment regimen will be updated to reflect the positive test results. Final Clinical Impressions(s) / UC Diagnoses   Final diagnoses:  STD exposure  Acute vaginitis   Discharge Instructions   None    ED Prescriptions    None     PDMP not reviewed this encounter.   Merrilee Jansky, MD 01/05/20 [phone]

## 2020-01-06 LAB — RPR: RPR Ser Ql: NONREACTIVE

## 2020-01-07 ENCOUNTER — Other Ambulatory Visit: Payer: Medicaid Other

## 2020-01-07 ENCOUNTER — Ambulatory Visit: Payer: Medicaid Other | Attending: Internal Medicine

## 2020-01-07 LAB — CERVICOVAGINAL ANCILLARY ONLY
Bacterial vaginitis: NEGATIVE
Candida vaginitis: NEGATIVE
Chlamydia: NEGATIVE
Neisseria Gonorrhea: NEGATIVE
Trichomonas: NEGATIVE

## 2020-04-16 ENCOUNTER — Other Ambulatory Visit: Payer: Self-pay

## 2020-04-16 ENCOUNTER — Encounter (HOSPITAL_COMMUNITY): Payer: Self-pay | Admitting: *Deleted

## 2020-04-16 ENCOUNTER — Emergency Department (HOSPITAL_COMMUNITY)
Admission: EM | Admit: 2020-04-16 | Discharge: 2020-04-16 | Disposition: A | Discharge status: Home / Self Care | Payer: Medicaid Other | Attending: Emergency Medicine | Admitting: Emergency Medicine

## 2020-04-16 DIAGNOSIS — I10 Essential (primary) hypertension: Secondary | ICD-10-CM | POA: Diagnosis not present

## 2020-04-16 DIAGNOSIS — N390 Urinary tract infection, site not specified: Secondary | ICD-10-CM | POA: Insufficient documentation

## 2020-04-16 DIAGNOSIS — R1033 Periumbilical pain: Secondary | ICD-10-CM | POA: Diagnosis not present

## 2020-04-16 DIAGNOSIS — Z87891 Personal history of nicotine dependence: Secondary | ICD-10-CM | POA: Diagnosis not present

## 2020-04-16 DIAGNOSIS — R300 Dysuria: Secondary | ICD-10-CM | POA: Insufficient documentation

## 2020-04-16 DIAGNOSIS — R350 Frequency of micturition: Secondary | ICD-10-CM | POA: Diagnosis present

## 2020-04-16 LAB — URINALYSIS, ROUTINE W REFLEX MICROSCOPIC
Bilirubin Urine: NEGATIVE
Glucose, UA: NEGATIVE mg/dL
Ketones, ur: NEGATIVE mg/dL
Nitrite: NEGATIVE
Protein, ur: 100 mg/dL — AB
RBC / HPF: >50 RBC/hpf — ABNORMAL HIGH (ref 0–5)
Specific Gravity, Urine: 1.019 (ref 1.005–1.030)
WBC, UA: >50 WBC/hpf — ABNORMAL HIGH (ref 0–5)
pH: 6 (ref 5.0–8.0)

## 2020-04-16 LAB — POC OCCULT BLOOD, ED: Fecal Occult Bld: NEGATIVE

## 2020-04-16 LAB — POC URINE PREG, ED: Preg Test, Ur: NEGATIVE

## 2020-04-16 MED ORDER — PHENAZOPYRIDINE HCL 100 MG PO TABS
200.0000 mg | ORAL_TABLET | Freq: Once | ORAL | Status: AC
  Administered 2020-04-16: 200 mg via ORAL
  Filled 2020-04-16: qty 2

## 2020-04-16 MED ORDER — CEPHALEXIN 500 MG PO CAPS: ORAL_CAPSULE | ORAL | 0 refills | Status: DC

## 2020-04-16 MED ORDER — PHENAZOPYRIDINE HCL 200 MG PO TABS: 200.0000 mg | ORAL_TABLET | Freq: Three times a day (TID) | ORAL | 0 refills | Status: DC | PRN

## 2020-04-16 MED ORDER — CEPHALEXIN 250 MG PO CAPS
500.0000 mg | ORAL_CAPSULE | Freq: Once | ORAL | Status: AC
  Administered 2020-04-16: 500 mg via ORAL
  Filled 2020-04-16: qty 2

## 2020-04-16 NOTE — ED Notes (Signed)
Discharge instructions including prescription and follow up care dicussed with pt. Pt verbalized understanding with no questions at this time.

## 2020-04-16 NOTE — ED Provider Notes (Signed)
[facility] EMERGENCY DEPARTMENT Provider Note   CSN: [id number] Arrival date & time: 04/16/20  0023     History Chief Complaint  Patient presents with  . Constipation    Carrie Hampton is a 29 y.o. female with a hx of HTN presents to the Emergency Department complaining of gradual, persistent, progressively worsening urinary frequency, urinary urgency and dysuria onset p.m. last night.  Patient reports she also had the urge to go to the bathroom but was only having small bowel movements.  Patient reports 6 or 7 small bowel movements without diarrhea.  Patient has associated suprapubic abdominal pain.  Denies fevers, chills, back pain, flank pain, chest pain, shortness of breath, vomiting, diarrhea, weakness, dizziness, syncope, vaginal discharge.  No treatments prior to arrival.  No aggravating or alleviating factors.  The history is provided by the patient and medical records. No language interpreter was used.       Past Medical History:  Diagnosis Date  . Hypertension   . Premature rupture of membranes 11/03/2017   Patient had PROM at 19 weeks.   . STD (female) 2013   Chlamydia    Patient Active Problem List   Diagnosis Date Noted  . [redacted] weeks gestation of pregnancy 10/23/2019  . Labor and delivery, indication for care 10/20/2019  . GBS (group B Streptococcus carrier), +RV culture, currently pregnant 10/10/2019  . Marginal insertion of umbilical cord affecting management of mother 09/03/2019  . Cervical cerclage suture present 04/22/2019  . Transient hypertension 04/22/2019  . History of pre-eclampsia in prior pregnancy, currently pregnant 03/28/2019  . Supervision of high-risk pregnancy 03/24/2019  . History of cervical incompetence 03/24/2019    Past Surgical History:  Procedure Laterality Date  . CERVICAL CERCLAGE N/A 04/22/2019   Procedure: CERCLAGE CERVICAL;  Surgeon: [place] Bing, MD;  Location: [facility] LD [facility]S;  Service: Gynecology;  Laterality:  N/A;  . TOOTH EXTRACTION       OB History    Gravida  6   Para  2   Term  1   Preterm  0   AB  4   Living  1     SAB  2   TAB  2   Ectopic  0   Multiple  0   Live Births  1           Family History  Problem Relation Age of Onset  . Healthy Mother   . Healthy Father   . Alcohol abuse Neg Hx   . Arthritis Neg Hx   . Asthma Neg Hx   . Birth defects Neg Hx   . Cancer Neg Hx   . COPD Neg Hx   . Depression Neg Hx   . Diabetes Neg Hx   . Drug abuse Neg Hx   . Early death Neg Hx   . Hearing loss Neg Hx   . Heart disease Neg Hx   . Hyperlipidemia Neg Hx   . Hypertension Neg Hx   . Kidney disease Neg Hx   . Learning disabilities Neg Hx   . Mental illness Neg Hx   . Mental retardation Neg Hx   . Miscarriages / Stillbirths Neg Hx   . Stroke Neg Hx   . Vision loss Neg Hx     Social History   Tobacco Use  . Smoking status: Former Smoker    Packs/day: 1.00    Types: Cigars    Quit date: 10/20/2012    Years since quitting: 7.4  .  Smokeless tobacco: Never Used  Substance Use Topics  . Alcohol use: No    Comment: socially  . Drug use: No    Types: Marijuana    Comment: none per pt 03/24/19    Home Medications Prior to Admission medications   Medication Sig Start Date End Date Taking? Authorizing Provider  acetaminophen (TYLENOL) 325 MG tablet Take 2 tablets (650 mg total) by mouth every 6 (six) hours as needed (for pain scale < 4). Patient not taking: Reported on 04/16/2020 10/23/19   Chauncey Mann, MD  cephALEXin [facility]) 500 MG capsule 1 cap po bid x 7 days 04/16/20   Abree Romick, Jarrett Soho, PA-C  ibuprofen (ADVIL) 600 MG tablet Take 1 tablet (600 mg total) by mouth every 6 (six) hours as needed. Patient not taking: Reported on 04/16/2020 10/23/19   Chauncey Mann, MD  phenazopyridine (PYRIDIUM) 200 MG tablet Take 1 tablet (200 mg total) by mouth 3 (three) times daily as needed for pain. 04/16/20   Meriem Lemieux, Jarrett Soho, PA-C  amLODipine (NORVASC) 2.5  MG tablet Take 1 tablet (2.5 mg total) by mouth daily. 10/23/19 01/05/20  Chauncey Mann, MD    Allergies    Patient has no known allergies.  Review of Systems   Review of Systems  Constitutional: Negative for appetite change, diaphoresis, fatigue, fever and unexpected weight change.  HENT: Negative for mouth sores.   Eyes: Negative for visual disturbance.  Respiratory: Negative for cough, chest tightness, shortness of breath and wheezing.   Cardiovascular: Negative for chest pain.  Gastrointestinal: Negative for abdominal pain, constipation, diarrhea, nausea and vomiting.  Endocrine: Negative for polydipsia, polyphagia and polyuria.  Genitourinary: Positive for dysuria, frequency and urgency. Negative for hematuria.  Musculoskeletal: Negative for back pain and neck stiffness.  Skin: Negative for rash.  Allergic/Immunologic: Negative for immunocompromised state.  Neurological: Negative for syncope, light-headedness and headaches.  Hematological: Does not bruise/bleed easily.  Psychiatric/Behavioral: Negative for sleep disturbance. The patient is not nervous/anxious.     Physical Exam Updated Vital Signs BP (!) 123/94 (BP Location: Right Arm)   Pulse 81   Temp 97.8 F (36.6 C) (Oral)   Resp 16   SpO2 99%   Physical Exam Vitals and nursing note reviewed.  Constitutional:      General: She is not in acute distress.    Appearance: She is not diaphoretic.  HENT:     Head: Normocephalic.  Eyes:     General: No scleral icterus.    Conjunctiva/sclera: Conjunctivae normal.  Cardiovascular:     Rate and Rhythm: Normal rate and regular rhythm.     Pulses: Normal pulses.          Radial pulses are 2+ on the right side and 2+ on the left side.  Pulmonary:     Effort: No tachypnea, accessory muscle usage, prolonged expiration, respiratory distress or retractions.     Breath sounds: No stridor.     Comments: Equal chest rise. No increased work of breathing. Abdominal:      General: There is no distension.     Palpations: Abdomen is soft.     Tenderness: There is abdominal tenderness in the suprapubic area. There is no right CVA tenderness, left CVA tenderness, guarding or rebound.  Musculoskeletal:     Cervical back: Normal range of motion.     Comments: Moves all extremities equally and without difficulty.  Skin:    General: Skin is warm and dry.     Capillary Refill: Capillary refill takes  less than 2 seconds.  Neurological:     Mental Status: She is alert.     GCS: GCS eye subscore is 4. GCS verbal subscore is 5. GCS motor subscore is 6.     Comments: Speech is clear and goal oriented.  Psychiatric:        Mood and Affect: Mood normal.     ED Results / Procedures / Treatments   Labs (all labs ordered are listed, but only abnormal results are displayed) Labs Reviewed  URINALYSIS, ROUTINE W REFLEX MICROSCOPIC - Abnormal; Notable for the following components:      Result Value   APPearance CLOUDY (*)    Hgb urine dipstick LARGE (*)    Protein, ur 100 (*)    Leukocytes,Ua LARGE (*)    RBC / HPF >50 (*)    WBC, UA >50 (*)    Bacteria, UA RARE (*)    All other components within normal limits  URINE CULTURE  POC URINE PREG, ED    Procedures Procedures (including critical care time)  Medications Ordered in ED Medications  phenazopyridine (PYRIDIUM) tablet 200 mg (has no administration in time range)  cephALEXin (KEFLEX) capsule 500 mg (500 mg Oral Given 04/16/20 0865)    ED Course  I have reviewed the triage vital signs and the nursing notes.  Pertinent labs & imaging results that were available during my care of the patient were reviewed by me and considered in my medical decision making (see chart for details).    MDM Rules/Calculators/A&P                       Patient presents with symptoms of urinary tract infection.  UA shows leukocytes, white blood cells, red blood cells and white blood cell clumps.  Patient denies all vaginal  symptoms.  Will give Keflex and Pyridium.  6:01 AM Patient is feeling some better.  She has been able to void here in the emergency department without difficulty. Pt has been diagnosed with a UTI. Pt is afebrile, no CVA tenderness, normotensive, and denies N/V. Pt to be dc home with antibiotics and instructions to follow up with PCP if symptoms persist.    Final Clinical Impression(s) / ED Diagnoses Final diagnoses:  Lower urinary tract infectious disease    Rx / DC Orders ED Discharge Orders         Ordered    cephALEXin (KEFLEX) 500 MG capsule     04/16/20 0602    phenazopyridine (PYRIDIUM) 200 MG tablet  3 times daily PRN     04/16/20 0602           Cylie Dor, Boyd Kerbs 04/16/20 0604    Palumbo, April, MD 04/16/20 7846

## 2020-04-16 NOTE — ED Notes (Signed)
Post Void Bladder Scan 88 mL

## 2020-04-16 NOTE — ED Notes (Signed)
Pt states she had just had dinner yesterday around 7pm when she began to have abdominal cramping and a couple of episodes of diarrhea. As the night progressed she felt like she needed to have a BM and was unable to. Pt has also felt like she has not been able to empty her bladder. She denies vaginal discharge, no pain with urination, no abdominal tenderness, and no NVD at this time.

## 2020-04-16 NOTE — ED Triage Notes (Signed)
Pt says that she is having difficulty having a bowel movement and also feels like she has to urinate, but only having small amounts of urine.

## 2020-04-16 NOTE — ED Notes (Signed)
Bladder scan performed, 

## 2020-04-16 NOTE — Discharge Instructions (Addendum)
1. Medications: Keflex, usual home medications °2. Treatment: rest, drink plenty of fluids, take medications as prescribed °3. Follow Up: Please followup with your primary doctor in 3 days for discussion of your diagnoses and further evaluation after today's visit; if you do not have a primary care doctor use the resource guide provided to find one; return to the ER for fevers, persistent vomiting, worsening abdominal pain or other concerning symptoms. ° °

## 2020-04-18 LAB — URINE CULTURE: Culture: 80000 — AB

## 2020-04-19 ENCOUNTER — Telehealth: Payer: Self-pay | Admitting: *Deleted

## 2020-04-19 NOTE — Telephone Encounter (Signed)
Post ED Visit - Positive Culture Follow-up  Culture report reviewed by antimicrobial stewardship pharmacist: [facility] []  [address], Pharm.D. []  [place], Pharm.D., BCPS AQ-ID []  , Pharm.D., BCPS []  Celedonio Miyamoto, .D., BCPS []  [place], .D., BCPS, AAHIVP []  Georgina Pillion, Pharm.D., BCPS, AAHIVP []  [address], PharmD, BCPS []  , PharmD, BCPS []  [place], PharmD, BCPS []  [address], PharmD []  , PharmD, BCPS [x]  Estella Husk, PharmD  Pharmacy Team []  Lysle Pearl, PharmD []  , PharmD []  Phillips Climes, PharmD []  , Rph []  Agapito Games) , PharmD []  Verlan Friends, PharmD []  , PharmD []  Mervyn Gay, PharmD []  , PharmD []  Vinnie Level, PharmD []  [facility], PharmD []  , PharmD []  Len Childs, PharmD   Positive urine culture Treated with cephalexin, organism sensitive to the same and no further patient follow-up is required at this time.  [facility] 04/19/2020, 11:17 AM

## 2020-05-19 ENCOUNTER — Encounter (HOSPITAL_COMMUNITY): Payer: Self-pay

## 2020-05-19 ENCOUNTER — Ambulatory Visit (HOSPITAL_COMMUNITY)
Admission: EM | Admit: 2020-05-19 | Discharge: 2020-05-19 | Disposition: A | Discharge status: Home / Self Care | Payer: Medicaid Other | Attending: Internal Medicine | Admitting: Internal Medicine

## 2020-05-19 ENCOUNTER — Other Ambulatory Visit: Payer: Self-pay

## 2020-05-19 DIAGNOSIS — H1089 Other conjunctivitis: Secondary | ICD-10-CM | POA: Diagnosis not present

## 2020-05-19 DIAGNOSIS — S0502XA Injury of conjunctiva and corneal abrasion without foreign body, left eye, initial encounter: Secondary | ICD-10-CM | POA: Diagnosis not present

## 2020-05-19 MED ORDER — FLUORESCEIN SODIUM 1 MG OP STRP
ORAL_STRIP | OPHTHALMIC | Status: AC
  Filled 2020-05-19: qty 1

## 2020-05-19 MED ORDER — BACITRACIN-POLYMYXIN B 500-10000 UNIT/GM OP OINT
1.0000 | TOPICAL_OINTMENT | Freq: Two times a day (BID) | OPHTHALMIC | 0 refills | Status: DC
Start: 2020-05-19 — End: 2023-02-04

## 2020-05-19 MED ORDER — KETOROLAC TROMETHAMINE 0.5 % OP SOLN
1.0000 [drp] | Freq: Four times a day (QID) | OPHTHALMIC | 0 refills | Status: DC
Start: 2020-05-19 — End: 2023-02-04
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
## 2020-05-23 IMAGING — US US MFM OB FOLLOW-UP
1 series · 13 of 28 positions shown · non-contrast
Comparison: none

[Series 1: us mfm ob follow-up · 13 of 32 slices shown]
[im 2/32]
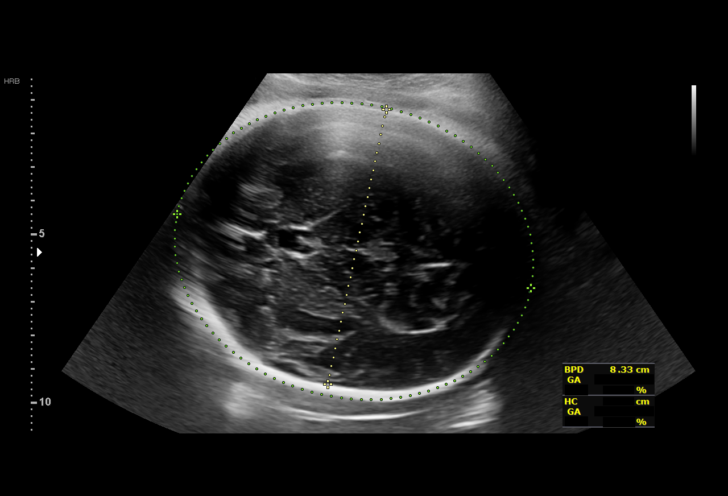
[im 4/32]
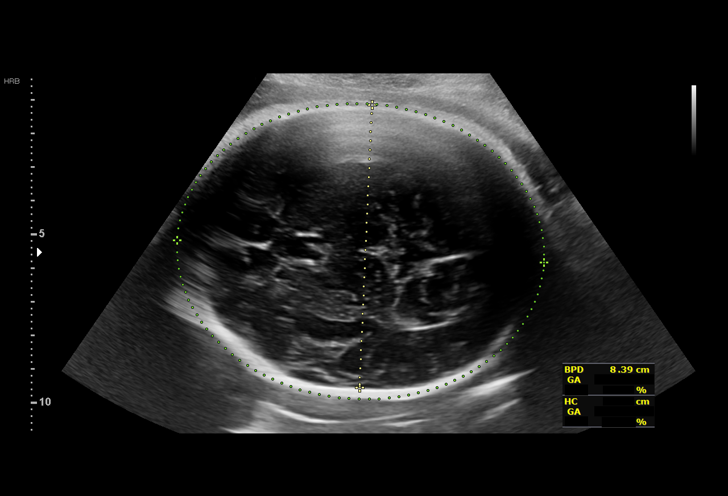
[im 6/32]
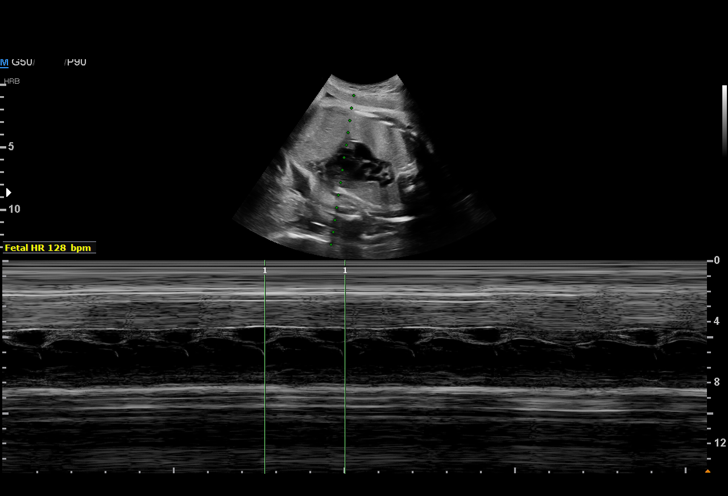
[im 9/32]
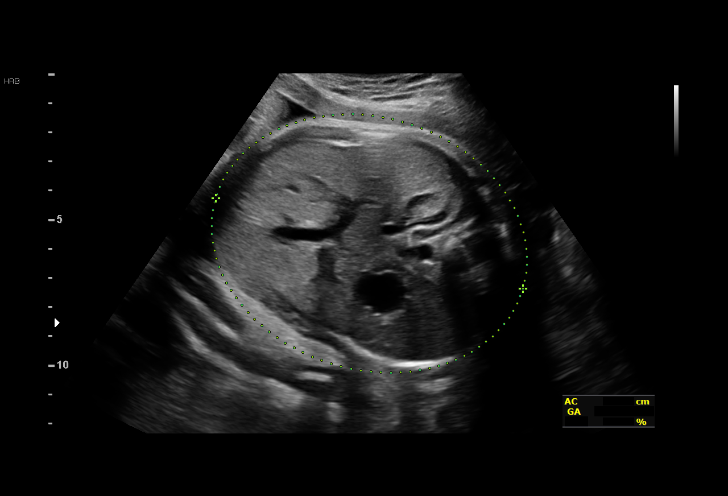
[im 11/32]
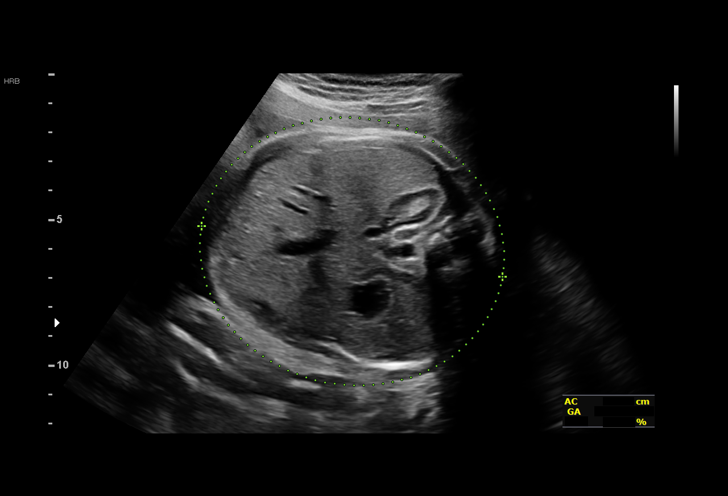
[im 13/32]
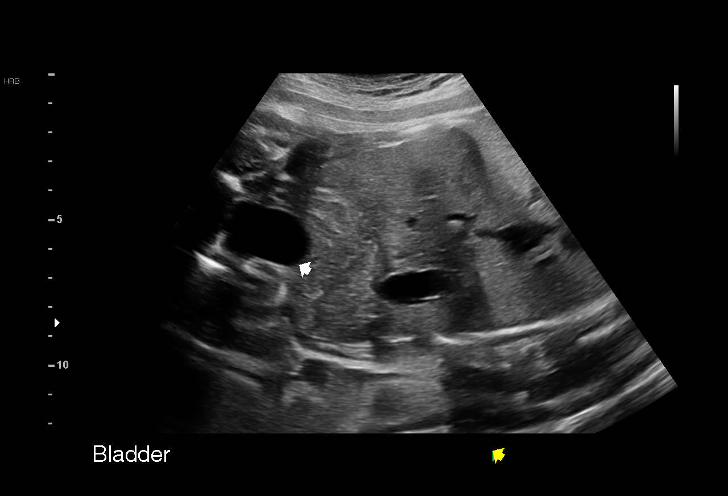
[im 17/32]
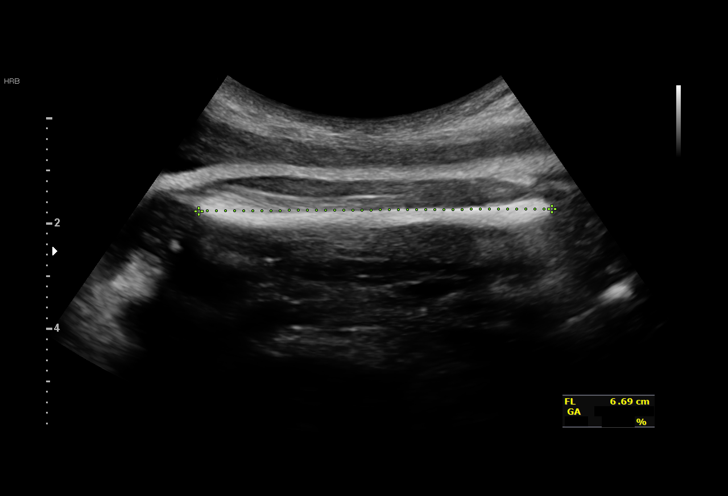
[im 19/32]
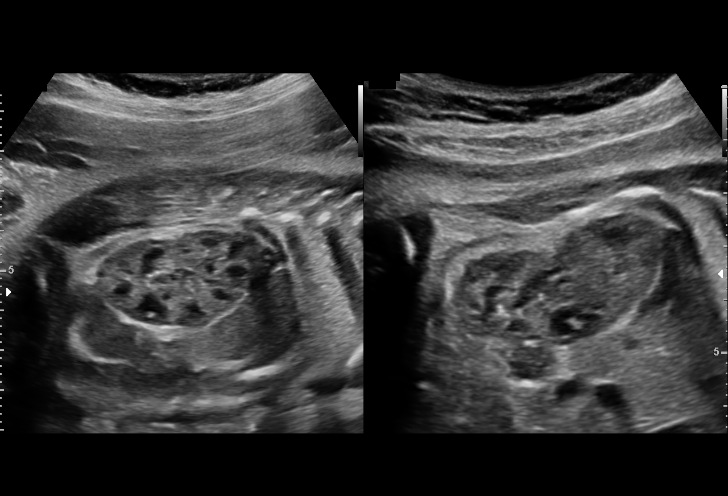
[im 21/32]
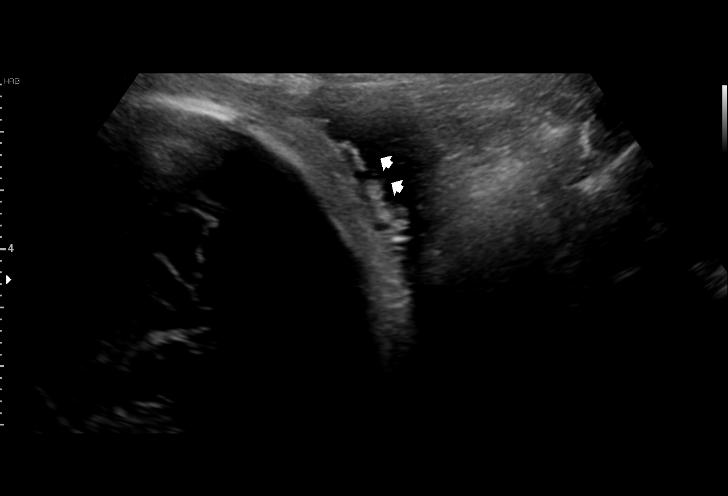
[im 23/32]
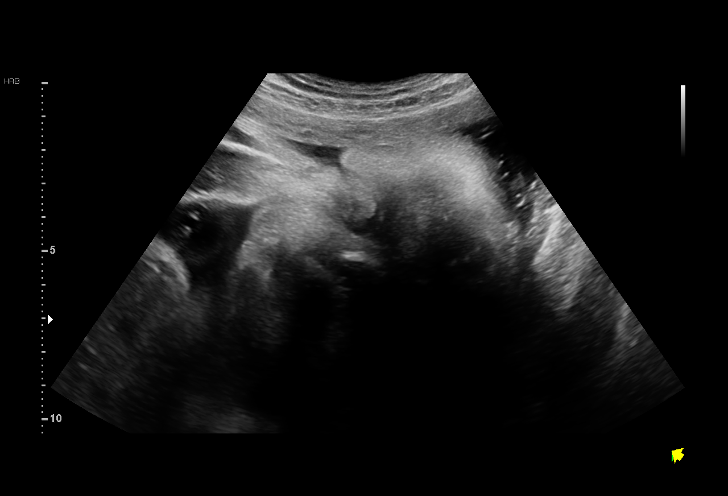
[im 26/32]
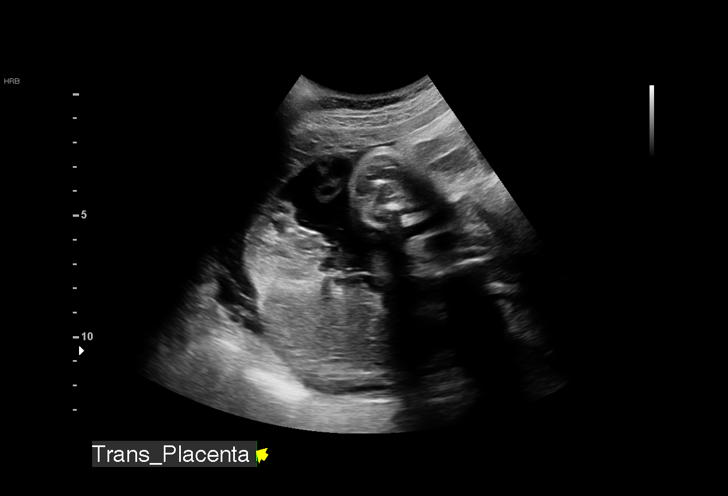
[im 28/32]
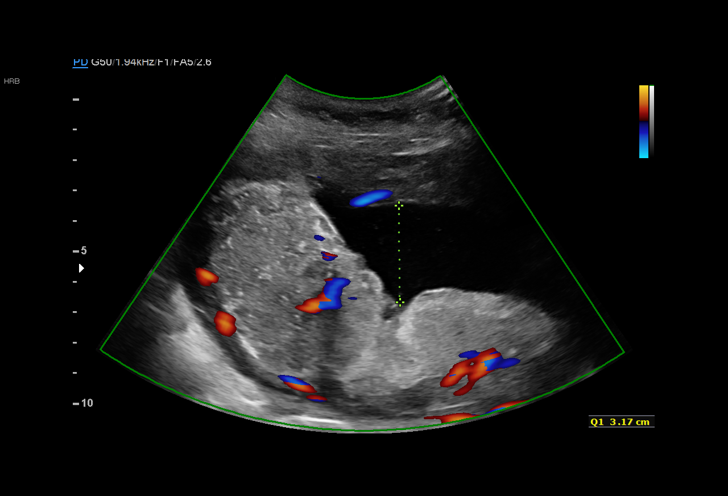
[im 30/32]
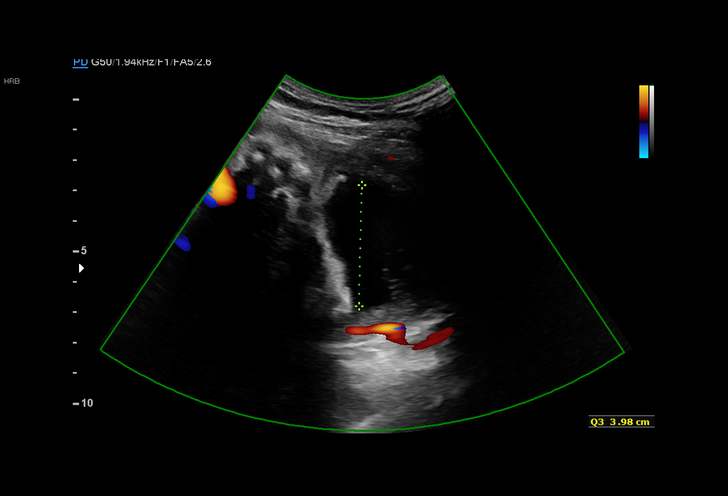

[13 of 28 positions shown; findings below may reference images not displayed]

----------------------------------------------------------------------

 ----------------------------------------------------------------------
Indications

  Cervical cerclage suture present, third
  trimester
  Cervical incompetence, third trimester
  Encounter for other antenatal screening
  follow-up (NIPS, low risk male)
  Poor obstetric history: Previous
  preeclampsia / eclampsia/gestational HTN
  (ASA)
  Poor obstetric history (hx of PROM at 19
  weeks)
  34 weeks gestation of pregnancy
 ----------------------------------------------------------------------
Vital Signs

 BMI:
Fetal Evaluation

 Num Of Fetuses:         1
 Fetal Heart Rate(bpm):  128
 Cardiac Activity:       Observed
 Presentation:           Cephalic
 Placenta:               Posterior
 P. Cord Insertion:      Marginal insertion

 Amniotic Fluid
 AFI FV:      Within normal limits

 AFI Sum(cm)     %Tile       Largest Pocket(cm)
 10.47           22
 RUQ(cm)       RLQ(cm)       LUQ(cm)        LLQ(cm)
 3.17          3.32          0
Biometry

 BPD:      83.9  mm     G. Age:  33w 5d         40  %    CI:        74.39   %    70 - 86
                                                         FL/HC:      21.5   %    19.4 -
 HC:      308.8  mm     G. Age:  34w 3d         25  %    HC/AC:      1.00        0.96 -
 AC:      308.8  mm     G. Age:  34w 6d         77  %    FL/BPD:     79.1   %    71 - 87
 FL:       66.4  mm     G. Age:  34w 1d         45  %    FL/AC:      21.5   %    20 - 24
 LV:        2.2  mm

 Est. FW:    6221  gm      5 lb 6 oz     59  %
OB History

 Gravidity:    6          SAB:   5
 TOP:          0       Ectopic:  0        Living: 0
Gestational Age

 LMP:           37w 1d        Date:  01/06/19                 EDD:   10/13/19
 U/S Today:     34w 2d                                        EDD:   11/02/19
 Best:          34w 0d     Det. By:  U/S C R L  (03/27/19)    EDD:   11/04/19
Anatomy

 Cranium:               Appears normal         LVOT:                   Previously seen
 Cavum:                 Appears normal         Aortic Arch:            Previously seen
 Ventricles:            Appears normal         Ductal Arch:            Previously seen
 Choroid Plexus:        Previously seen        Diaphragm:              Appears normal
 Cerebellum:            Previously seen        Stomach:                Appears normal, left
                                                                       sided
 Posterior Fossa:       Previously seen        Abdomen:                Appears normal
 Nuchal Fold:           Not applicable (>20    Abdominal Wall:         Previously seen
                        wks GA)
 Face:                  Orbits and profile     Cord Vessels:           Previously seen
                        previously seen
 Lips:                  Previously seen        Kidneys:                Appear normal
 Palate:                Previously seen        Bladder:                Appears normal
 Thoracic:              Previously seen        Spine:                  Previously seen
 Heart:                 Previously seen        Upper Extremities:      Previously seen
 RVOT:                  Previously seen        Lower Extremities:      Previously seen

 Other:  Heels and 5th digit previously visualized. Nasal bone previously
         visualized. Fetus appears to be a male.
Cervix Uterus Adnexa

 Cervix
 Not visualized (advanced GA >78wks)
Impression

 Marginal cord insertion.  Patient return for fetal growth
 assessment.  She had prophylactic cerclage in the first
 trimester and does not have symptoms of preterm labor.  She
 does not have gestational diabetes.
 Amniotic fluid is normal and good fetal activity is seen. Fetal
 growth is appropriate for gestational age.
Recommendations

 Follow-up scans as clinically indicated.
                 Ivanovics, E. Victor

## 2020-10-27 ENCOUNTER — Ambulatory Visit (HOSPITAL_COMMUNITY)
Admission: EM | Admit: 2020-10-27 | Discharge: 2020-10-27 | Disposition: A | Discharge status: Home / Self Care | Payer: Medicaid Other | Attending: Family Medicine | Admitting: Family Medicine

## 2020-10-27 ENCOUNTER — Other Ambulatory Visit: Payer: Self-pay

## 2020-10-27 ENCOUNTER — Encounter (HOSPITAL_COMMUNITY): Payer: Self-pay | Admitting: *Deleted

## 2020-10-27 DIAGNOSIS — Z202 Contact with and (suspected) exposure to infections with a predominantly sexual mode of transmission: Secondary | ICD-10-CM

## 2020-10-27 DIAGNOSIS — Z7689 Persons encountering health services in other specified circumstances: Secondary | ICD-10-CM | POA: Diagnosis not present

## 2020-10-27 LAB — POCT URINALYSIS DIPSTICK, ED / UC
Bilirubin Urine: NEGATIVE
Glucose, UA: NEGATIVE mg/dL
Hgb urine dipstick: NEGATIVE
Leukocytes,Ua: NEGATIVE
Nitrite: NEGATIVE
Protein, ur: NEGATIVE mg/dL
Specific Gravity, Urine: 1.025 (ref 1.005–1.030)
Urobilinogen, UA: 0.2 mg/dL (ref 0.0–1.0)
pH: 7 (ref 5.0–8.0)

## 2020-10-27 LAB — POC URINE PREG, ED: Preg Test, Ur: NEGATIVE

## 2020-10-27 NOTE — Discharge Instructions (Signed)
Results for gonorrhea, chlamydia, yeast and BV testing will post your MyChart.

## 2020-10-27 NOTE — ED Provider Notes (Signed)
____________________________________________  Time seen: Approximately 1:52 PM  I have reviewed the triage vital signs and the nursing notes.   HISTORY  Chief Complaint Exposure to STD and Possible Pregnancy   Historian Patient    HPI Carrie Hampton is a 29 y.o. female presents to the urgent care with a request for pregnancy and STD testing.  Patient is currently completely asymptomatic.  She reports that she just broke up with her boyfriend and "just wants to make sure everything is okay.".  No dysuria, hematuria, increased urinary frequency, low back pain, nausea or changes in vaginal discharge.  No other alleviating measures have been attempted.   Past Medical History:  Diagnosis Date   Hypertension    Premature rupture of membranes 11/03/2017   Patient had PROM at 19 weeks.    STD (female) 2013   Chlamydia     Immunizations up to date:  Yes.     Past Medical History:  Diagnosis Date   Hypertension    Premature rupture of membranes 11/03/2017   Patient had PROM at 19 weeks.    STD (female) 2013   Chlamydia    Patient Active Problem List   Diagnosis Date Noted   [redacted] weeks gestation of pregnancy 10/23/2019   Labor and delivery, indication for care 10/20/2019   GBS (group B Streptococcus carrier), +RV culture, currently pregnant 10/10/2019   Marginal insertion of umbilical cord affecting management of mother 09/03/2019   Cervical cerclage suture present 04/22/2019   Transient hypertension 04/22/2019   History of pre-eclampsia in prior pregnancy, currently pregnant 03/28/2019   Supervision of high-risk pregnancy 03/24/2019   History of cervical incompetence 03/24/2019    Past Surgical History:  Procedure Laterality Date   CERVICAL CERCLAGE N/A 04/22/2019   Procedure: CERCLAGE CERVICAL;  Surgeon: Naco Bing, MD;  Location: [facility];  Service: Gynecology;  Laterality: N/A;   TOOTH EXTRACTION      Prior to Admission medications    Medication Sig Start Date End Date Taking? Authorizing Provider  bacitracin-polymyxin b (POLYSPORIN) ophthalmic ointment Place 1 application into the left eye 2 (two) times daily. apply to eye every 12 hours while awake 05/19/20   Lamptey, Britta Mccreedy, MD  cephALEXin (KEFLEX) 500 MG capsule 1 cap po bid x 7 days 04/16/20   Muthersbaugh, Dahlia Client, PA-C  ketorolac (ACULAR) 0.5 % ophthalmic solution Place 1 drop into the left eye every 6 (six) hours. 05/19/20   Lamptey, Britta Mccreedy, MD  phenazopyridine (PYRIDIUM) 200 MG tablet Take 1 tablet (200 mg total) by mouth 3 (three) times daily as needed for pain. 04/16/20   Muthersbaugh, Dahlia Client, PA-C  amLODipine (NORVASC) 2.5 MG tablet Take 1 tablet (2.5 mg total) by mouth daily. 10/23/19 01/05/20  Joselyn Arrow, MD    Allergies Patient has no known allergies.  Family History  Problem Relation Age of Onset   Healthy Mother    Healthy Father    Alcohol abuse Neg Hx    Arthritis Neg Hx    Asthma Neg Hx    Birth defects Neg Hx    Cancer Neg Hx    COPD Neg Hx    Depression Neg Hx    Diabetes Neg Hx    Drug abuse Neg Hx    Early death Neg Hx    Hearing loss Neg Hx    Heart disease Neg Hx    Hyperlipidemia Neg Hx    Hypertension Neg Hx    Kidney disease Neg Hx  Learning disabilities Neg Hx    Mental illness Neg Hx    Mental retardation Neg Hx    Miscarriages / Stillbirths Neg Hx    Stroke Neg Hx    Vision loss Neg Hx     Social History Social History   Tobacco Use   Smoking status: Former Smoker    Packs/day: 1.00    Types: Cigars    Quit date: 10/20/2012    Years since quitting: 8.0   Smokeless tobacco: Never Used  Vaping Use   Vaping Use: Never used  Substance Use Topics   Alcohol use: No    Comment: socially   Drug use: No    Types: Marijuana    Comment: none per pt 03/24/19     Review of Systems  Constitutional: No fever/chills Eyes:  No discharge ENT: No upper respiratory complaints. Respiratory:  no cough. No SOB/ use of accessory muscles to breath Gastrointestinal:   No nausea, no vomiting.  No diarrhea.  No constipation. Musculoskeletal: Negative for musculoskeletal pain. Skin: Negative for rash, abrasions, lacerations, ecchymosis.    ____________________________________________   PHYSICAL EXAM:  VITAL SIGNS: ED Triage Vitals  Enc Vitals Group     BP 10/27/20 1259 137/87     Pulse Rate 10/27/20 1259 93     Resp 10/27/20 1259 16     Temp 10/27/20 1259 98.4 F (36.9 C)     Temp Source 10/27/20 1259 Oral     SpO2 10/27/20 1259 100 %     Weight 10/27/20 1302 140 lb (63.5 kg)     Height 10/27/20 1302 5\' 4"  (1.626 m)     Head Circumference --      Peak Flow --      Pain Score 10/27/20 1302 0     Pain Loc --      Pain Edu? --      Excl. in GC? --      Constitutional: Alert and oriented. Well appearing and in no acute distress. Eyes: Conjunctivae are normal. PERRL. EOMI. Head: Atraumatic. Cardiovascular: Normal rate, regular rhythm. Normal S1 and S2.  Good peripheral circulation. Respiratory: Normal respiratory effort without tachypnea or retractions. Lungs CTAB. Good air entry to the bases with no decreased or absent breath sounds Gastrointestinal: Bowel sounds x 4 quadrants. Soft and nontender to palpation. No guarding or rigidity. No distention. Musculoskeletal: Full range of motion to all extremities. No obvious deformities noted Neurologic:  Normal for age. No gross focal neurologic deficits are appreciated.  Skin:  Skin is warm, dry and intact. No rash noted. Psychiatric: Mood and affect are normal for age. Speech and behavior are normal.   ____________________________________________   LABS (all labs ordered are listed, but only abnormal results are displayed)  Labs Reviewed  POCT URINALYSIS DIPSTICK, ED / UC - Abnormal; Notable for the following components:      Result Value   Ketones, ur TRACE (*)    All other components within normal limits  POC URINE  PREG, ED  CERVICOVAGINAL ANCILLARY ONLY   ____________________________________________  EKG   ____________________________________________  RADIOLOGY  No results found.  ____________________________________________    PROCEDURES  Procedure(s) performed:     Procedures     Medications - No data to display   ____________________________________________   INITIAL IMPRESSION / ASSESSMENT AND PLAN / ED COURSE  Pertinent labs & imaging results that were available during my care of the patient were reviewed by me and considered in my medical decision making (see chart for  details).      Assessment and plan Encounter for STD testing 29 year old female presents to the urgent care for routine STD testing and is currently asymptomatic.  We will test patient for gonorrhea, chlamydia, yeast, BV. No empiric treatment at this time.    ____________________________________________  FINAL CLINICAL IMPRESSION(S) / ED DIAGNOSES  Final diagnoses:  Encounter for assessment of STD exposure      NEW MEDICATIONS STARTED DURING THIS VISIT:  ED Discharge Orders    None          This chart was dictated using voice recognition software/Dragon. Despite best efforts to proofread, errors can occur which can change the meaning. Any change was purely unintentional.     Orvil Feil, PA-C 10/27/20 1455

## 2020-10-27 NOTE — ED Triage Notes (Signed)
Pt wants everything tested for STD and Preg testing.

## 2020-10-28 LAB — CERVICOVAGINAL ANCILLARY ONLY
Bacterial Vaginitis (gardnerella): NEGATIVE
Candida Glabrata: NEGATIVE
Candida Vaginitis: NEGATIVE
Chlamydia: NEGATIVE
Comment: NEGATIVE
Comment: NEGATIVE
Comment: NEGATIVE
Comment: NEGATIVE
Comment: NEGATIVE
Comment: NORMAL
Neisseria Gonorrhea: NEGATIVE
Trichomonas: NEGATIVE

## 2021-03-08 ENCOUNTER — Ambulatory Visit (INDEPENDENT_AMBULATORY_CARE_PROVIDER_SITE_OTHER): Payer: Medicaid Other | Admitting: Obstetrics

## 2021-03-08 ENCOUNTER — Other Ambulatory Visit: Payer: Self-pay

## 2021-03-08 ENCOUNTER — Other Ambulatory Visit (HOSPITAL_COMMUNITY)
Admission: RE | Admit: 2021-03-08 | Discharge: 2021-03-08 | Disposition: A | Discharge status: Home / Self Care | Payer: Medicaid Other | Source: Ambulatory Visit | Attending: Obstetrics | Admitting: Obstetrics

## 2021-03-08 ENCOUNTER — Encounter: Payer: Self-pay | Admitting: Obstetrics

## 2021-03-08 VITALS — BP 119/78 | HR 78 | Ht 63.0 in | Wt 133.0 lb

## 2021-03-08 DIAGNOSIS — N939 Abnormal uterine and vaginal bleeding, unspecified: Secondary | ICD-10-CM | POA: Diagnosis not present

## 2021-03-08 DIAGNOSIS — N898 Other specified noninflammatory disorders of vagina: Secondary | ICD-10-CM

## 2021-03-08 DIAGNOSIS — M549 Dorsalgia, unspecified: Secondary | ICD-10-CM

## 2021-03-08 DIAGNOSIS — Z01419 Encounter for gynecological examination (general) (routine) without abnormal findings: Secondary | ICD-10-CM | POA: Insufficient documentation

## 2021-03-08 NOTE — Progress Notes (Signed)
GYN presents for irregular bleeding, c/o bleeding for 2 weeks on her last period, back pain, cramps 5/10 x 2 weeks.  Denies discharge, odor, NV, chills.

## 2021-03-08 NOTE — Progress Notes (Signed)
Subjective:        Carrie Hampton is a 30 y.o. female here for a routine exam.  Current complaints: Irregular vaginal bleeding, cramping, backache..    Personal health questionnaire:  Is patient Ashkenazi Jewish, have a family history of breast and/or ovarian cancer: no Is there a family history of uterine cancer diagnosed at age < 93, gastrointestinal cancer, urinary tract cancer, family member who is a Personnel officer syndrome-associated carrier: no Is the patient overweight and hypertensive, family history of diabetes, personal history of gestational diabetes, preeclampsia or PCOS: no Is patient over 16, have PCOS,  family history of premature CHD under age 69, diabetes, smoke, have hypertension or peripheral artery disease:  no At any time, has a partner hit, kicked or otherwise hurt or frightened you?: no Over the past 2 weeks, have you felt down, depressed or hopeless?: no Over the past 2 weeks, have you felt little interest or pleasure in doing things?:no   Gynecologic History Patient's last menstrual period was 02/23/2021 (approximate). Contraception: none Last Pap: 2020. Results were: normal Last mammogram: n/a. Results were: n/a  Obstetric History OB History  Gravida Para Term Preterm AB Living  6 2 1  0 4 1  SAB IAB Ectopic Multiple Live Births  2 2 0 0 1    # Outcome Date GA Lbr Len/2nd Weight Sex Delivery Anes PTL Lv  6 Term 10/21/19 [redacted]w[redacted]d 41:03 / 01:07 6 lb 12.6 oz (3.08 kg) M Vag-Spont EPI  LIV  5 Para 11/03/17 [redacted]w[redacted]d  7.1 oz (0.2 kg) M Vag-Spont   FD  4 SAB 2014          3 SAB 2013             Birth Comments: System Generated. Please review and update pregnancy details.  2 IAB           1 IAB             Past Medical History:  Diagnosis Date  . Hypertension   . Premature rupture of membranes 11/03/2017   Patient had PROM at 19 weeks.   . STD (female) 2013   Chlamydia    Past Surgical History:  Procedure Laterality Date  . CERVICAL CERCLAGE N/A 04/22/2019    Procedure: CERCLAGE CERVICAL;  Surgeon: 04/24/2019, MD;  Location: [facility];  Service: Gynecology;  Laterality: N/A;  . TOOTH EXTRACTION       Current Outpatient Medications:  .  bacitracin-polymyxin b (POLYSPORIN) ophthalmic ointment, Place 1 application into the left eye 2 (two) times daily. apply to eye every 12 hours while awake, Disp: 3.5 g, Rfl: 0 .  cephALEXin (KEFLEX) 500 MG capsule, 1 cap po bid x 7 days, Disp: 14 capsule, Rfl: 0 .  ketorolac (ACULAR) 0.5 % ophthalmic solution, Place 1 drop into the left eye every 6 (six) hours., Disp: 10 mL, Rfl: 0 .  phenazopyridine (PYRIDIUM) 200 MG tablet, Take 1 tablet (200 mg total) by mouth 3 (three) times daily as needed for pain., Disp: 6 tablet, Rfl: 0 No Known Allergies  Social History   Tobacco Use  . Smoking status: Former Smoker    Packs/day: 1.00    Types: Cigars    Quit date: 10/20/2012    Years since quitting: 8.3  . Smokeless tobacco: Never Used  Substance Use Topics  . Alcohol use: No    Comment: socially    Family History  Problem Relation Age of Onset  . Healthy Mother   .  Healthy Father   . Alcohol abuse Neg Hx   . Arthritis Neg Hx   . Asthma Neg Hx   . Birth defects Neg Hx   . Cancer Neg Hx   . COPD Neg Hx   . Depression Neg Hx   . Diabetes Neg Hx   . Drug abuse Neg Hx   . Early death Neg Hx   . Hearing loss Neg Hx   . Heart disease Neg Hx   . Hyperlipidemia Neg Hx   . Hypertension Neg Hx   . Kidney disease Neg Hx   . Learning disabilities Neg Hx   . Mental illness Neg Hx   . Mental retardation Neg Hx   . Miscarriages / Stillbirths Neg Hx   . Stroke Neg Hx   . Vision loss Neg Hx       Review of Systems  Constitutional: negative for fatigue and weight loss Respiratory: negative for cough and wheezing Cardiovascular: negative for chest pain, fatigue and palpitations Gastrointestinal: negative for abdominal pain and change in bowel habits Musculoskeletal: positive for myalgias Neurological:  negative for gait problems and tremors Behavioral/Psych: negative for abusive relationship, depression Endocrine: negative for temperature intolerance    Genitourinary:negative for abnormal menstrual periods, genital lesions, hot flashes, sexual problems.  Positive for vaginal discharge Integument/breast: negative for breast lump, breast tenderness, nipple discharge and skin lesion(s)    Objective:       BP 119/78   Pulse 78   Ht 5\' 3"  (1.6 m)   Wt 133 lb (60.3 kg)   LMP 02/23/2021 (Approximate)   Breastfeeding Yes   BMI 23.56 kg/m  General:   alert and no distress  Skin:   no rash or abnormalities  Lungs:   clear to auscultation bilaterally  Heart:   regular rate and rhythm, S1, S2 normal, no murmur, click, rub or gallop  Breasts:   normal without suspicious masses, skin or nipple changes or axillary nodes  Abdomen:  normal findings: no organomegaly, soft, non-tender and no hernia  Pelvis:  External genitalia: normal general appearance Urinary system: urethral meatus normal and bladder without fullness, nontender Vaginal: normal without tenderness, induration or masses Cervix: normal appearance Adnexa: normal bimanual exam Uterus: anteverted and non-tender, normal size   Lab Review Urine pregnancy test Labs reviewed yes Radiologic studies reviewed no  I have spent a total of 20 minutes of face-to-face time, excluding clinical staff time, reviewing notes and preparing to see patient, ordering tests and/or medications, and counseling the patient.   Assessment:     1. Encounter for gynecological examination with Papanicolaou smear of cervix Rx: - Cytology - PAP( [facility])  2. Mother currently breast-feeding  3. Abnormal uterine bleeding (AUB) Rx: - 02/25/2021 PELVIC COMPLETE WITH TRANSVAGINAL; Future  4. Vaginal discharge Rx: - Cervicovaginal ancillary only  5. Backache symptom Rx: - Urine Culture    Plan:    Education reviewed: calcium supplements, depression  evaluation, low fat, low cholesterol diet, safe sex/STD prevention, self breast exams and weight bearing exercise. Contraception: none. Follow up in: 3 months.    Orders Placed This Encounter  Procedures  . Urine Culture  . US PELVIC COMPLETE WITH TRANSVAGINAL    Standing Status:   Future    Standing Expiration Date:   03/08/2022    Order Specific Question:   Reason for Exam (SYMPTOM  OR DIAGNOSIS REQUIRED)    Answer:   AUB    Order Specific Question:   Preferred imaging location?  Answer:   [facility]    Brock Bad, MD 03/08/2021 11:44 AM

## 2021-03-09 ENCOUNTER — Other Ambulatory Visit: Payer: Self-pay | Admitting: Obstetrics

## 2021-03-09 DIAGNOSIS — B373 Candidiasis of vulva and vagina: Secondary | ICD-10-CM

## 2021-03-09 DIAGNOSIS — B9689 Other specified bacterial agents as the cause of diseases classified elsewhere: Secondary | ICD-10-CM

## 2021-03-09 DIAGNOSIS — B3731 Acute candidiasis of vulva and vagina: Secondary | ICD-10-CM

## 2021-03-09 DIAGNOSIS — N760 Acute vaginitis: Secondary | ICD-10-CM

## 2021-03-09 LAB — CERVICOVAGINAL ANCILLARY ONLY
Bacterial Vaginitis (gardnerella): POSITIVE — AB
Candida Glabrata: NEGATIVE
Candida Vaginitis: POSITIVE — AB
Chlamydia: NEGATIVE
Comment: NEGATIVE
Comment: NEGATIVE
Comment: NEGATIVE
Comment: NEGATIVE
Comment: NEGATIVE
Comment: NORMAL
Neisseria Gonorrhea: NEGATIVE
Trichomonas: NEGATIVE

## 2021-03-09 MED ORDER — METRONIDAZOLE 500 MG PO TABS: 500.0000 mg | ORAL_TABLET | Freq: Two times a day (BID) | ORAL | 2 refills | Status: DC

## 2021-03-09 MED ORDER — FLUCONAZOLE 150 MG PO TABS: 150.0000 mg | ORAL_TABLET | Freq: Once | ORAL | 0 refills | Status: AC

## 2021-03-10 ENCOUNTER — Other Ambulatory Visit: Payer: Self-pay | Admitting: Obstetrics

## 2021-03-10 ENCOUNTER — Other Ambulatory Visit: Payer: Self-pay

## 2021-03-10 LAB — CYTOLOGY - PAP

## 2021-03-10 LAB — URINE CULTURE

## 2021-03-10 MED ORDER — FLUCONAZOLE 150 MG PO TABS
150.0000 mg | ORAL_TABLET | Freq: Once | ORAL | 0 refills | Status: AC
Start: 2021-03-10 — End: 2021-03-10

## 2021-03-10 NOTE — Telephone Encounter (Signed)
Patient tested positive for BV and yeast. Flagyl and diflucan sent to the pharmacy.

## 2021-03-23 ENCOUNTER — Ambulatory Visit
Admission: RE | Admit: 2021-03-23 | Discharge: 2021-03-23 | Disposition: A | Discharge status: Home / Self Care | Payer: Medicaid Other | Source: Ambulatory Visit | Attending: Obstetrics | Admitting: Obstetrics

## 2021-03-23 ENCOUNTER — Other Ambulatory Visit: Payer: Self-pay

## 2021-03-23 DIAGNOSIS — N939 Abnormal uterine and vaginal bleeding, unspecified: Secondary | ICD-10-CM | POA: Diagnosis present

## 2021-03-29 ENCOUNTER — Encounter: Payer: Medicaid Other | Admitting: Obstetrics

## 2021-04-20 ENCOUNTER — Other Ambulatory Visit: Payer: Self-pay

## 2021-04-20 ENCOUNTER — Other Ambulatory Visit (HOSPITAL_COMMUNITY)
Admission: RE | Admit: 2021-04-20 | Discharge: 2021-04-20 | Disposition: A | Discharge status: Home / Self Care | Payer: Medicaid Other | Source: Ambulatory Visit | Attending: Obstetrics | Admitting: Obstetrics

## 2021-04-20 ENCOUNTER — Encounter: Payer: Self-pay | Admitting: Obstetrics

## 2021-04-20 ENCOUNTER — Ambulatory Visit (INDEPENDENT_AMBULATORY_CARE_PROVIDER_SITE_OTHER): Payer: Medicaid Other | Admitting: Obstetrics

## 2021-04-20 VITALS — BP 121/81 | HR 80 | Wt 131.7 lb

## 2021-04-20 DIAGNOSIS — R87612 Low grade squamous intraepithelial lesion on cytologic smear of cervix (LGSIL): Secondary | ICD-10-CM

## 2021-04-20 DIAGNOSIS — Z01812 Encounter for preprocedural laboratory examination: Secondary | ICD-10-CM | POA: Diagnosis not present

## 2021-04-20 LAB — POCT URINE PREGNANCY: Preg Test, Ur: NEGATIVE

## 2021-04-20 NOTE — Progress Notes (Signed)
Pt presents for colpo s/p LSIL on pap smear.  UPT Negative

## 2021-04-20 NOTE — Progress Notes (Signed)
Colposcopy Procedure Note  Indications: Pap smear 1 months ago showed: low-grade squamous intraepithelial neoplasia (LGSIL - encompassing HPV,mild dysplasia,CIN I). The prior pap showed no abnormalities.  Prior cervical/vaginal disease: none. Prior cervical treatment: no treatment.  Procedure Details  The risks and benefits of the procedure and Written informed consent obtained.  A time-out was performed confirming the patient, procedure and allergy status  Speculum placed in vagina and excellent visualization of cervix achieved, cervix swabbed x 3 with acetic acid solution.  Findings: Cervix: no visible lesions, no mosaicism, no punctation and no abnormal vasculature; SCJ visualized 360 degrees without lesions, endocervical curettage performed, cervical biopsies taken at 6 and 12 o'clock, specimen labelled and sent to pathology and hemostasis achieved with silver nitrate.   Vaginal inspection: normal without visible lesions. Vulvar colposcopy: vulvar colposcopy not performed.   Physical Exam   Specimens: ECC and Cervical Biopsies  Complications: none.  Plan: Specimens labelled and sent to Pathology. Will base further treatment on Pathology findings. Treatment options discussed with patient. Post biopsy instructions given to patient. Return to discuss Pathology results in 2 weeks   Brock Bad, MD 04/20/2021 3:44 PM.

## 2021-04-22 LAB — SURGICAL PATHOLOGY

## 2021-05-04 ENCOUNTER — Telehealth (INDEPENDENT_AMBULATORY_CARE_PROVIDER_SITE_OTHER): Payer: Medicaid Other | Admitting: Obstetrics

## 2021-05-04 ENCOUNTER — Encounter: Payer: Self-pay | Admitting: Obstetrics

## 2021-05-04 DIAGNOSIS — N870 Mild cervical dysplasia: Secondary | ICD-10-CM

## 2021-05-04 NOTE — Patient Instructions (Signed)

## 2021-05-04 NOTE — Progress Notes (Signed)
GYNECOLOGY VIRTUAL VISIT ENCOUNTER NOTE  [facility]   Patient location: Home  I connected with Carrie Hampton on 05/04/21 at  3:00 PM EDT by MyChart Video Encounter and verified that I am speaking with the correct person using two identifiers.   I discussed the limitations, risks, security and privacy concerns of performing an evaluation and management service virtually and the availability of in person appointments. I also discussed with the patient that there may be a patient responsible charge related to this service. The patient expressed understanding and agreed to proceed.   History:  Carrie Hampton is a 30 y.o. [phone] female being evaluated today for results of colposcopic biopsies for LGSIL pap smear. She denies any abnormal vaginal discharge, bleeding, pelvic pain or other concerns.       Past Medical History:  Diagnosis Date  . Hypertension   . Premature rupture of membranes 11/03/2017   Patient had PROM at 19 weeks.   . STD (female) 2013   Chlamydia   Past Surgical History:  Procedure Laterality Date  . CERVICAL CERCLAGE N/A 04/22/2019   Procedure: CERCLAGE CERVICAL;  Surgeon: Nelson Bing, MD;  Location: [facility];  Service: Gynecology;  Laterality: N/A;  . TOOTH EXTRACTION     The following portions of the patient's history were reviewed and updated as appropriate: allergies, current medications, past family history, past medical history, past social history, past surgical history and problem list.   Health Maintenance:  LGSIL pap on 03-08-2021.  Review of Systems:  Pertinent items noted in HPI and remainder of comprehensive ROS otherwise negative.  Physical Exam:   General:  Alert, oriented and cooperative. Patient appears to be in no acute distress.  Mental Status: Normal mood and affect. Normal behavior. Normal judgment and thought content.   Respiratory: Normal respiratory effort, no problems with  respiration noted  Rest of physical exam deferred due to type of encounter  Labs and Imaging Results for orders placed or performed in visit on 04/20/21 (from the past 336 hour(s))  Surgical pathology( [facility])   Collection Time: 04/20/21  3:40 PM  Result Value Ref Range   SURGICAL PATHOLOGY      SURGICAL PATHOLOGY CASE: [id number] PATIENT: [facility] Surgical Pathology Report     Clinical History: LGSIL (cm)     FINAL MICROSCOPIC DIAGNOSIS:  A.ENDOCERVIX, CURETTAGE: - Low-grade squamous intraepithelial lesion, CIN-1. - Benign endocervical mucosa.  B. CERVIX, 6 AND 12 O'CLOCK, BIOPSY: - Low-grade squamous intraepithelial lesion, CIN-1.    GROSS DESCRIPTION:  Specimen A: Received in formalin is blood tinged mucus that is entirely submitted in one block.  Volume: 1 x 1 x 0.2 cm.  Specimen B: Received in formalin are pink-white soft tissue fragments that are submitted in toto. Number: 2.  Size: 0.25 and 0.4 cm blocks: 1  SW 04/21/2021   Final Diagnosis performed by Jimmy Picket, MD.   Electronically signed 04/22/2021 Technical and / or Professional components performed at [facility]. [facility], [address], [place], [place] [address].  Immunohistochemistry Technical component (if applicable) was performed at [employer]. [address], [place], [place] [address].   IMMUNOHISTOCHEMISTRY DISCLAIMER (if applicable): Some of these immunohistochemical stains may have been developed and the performance characteristics determine by [facility]. Some may not have been cleared or approved by the U.S. Food and Drug Administration. The FDA has determined that such clearance or approval  is not necessary. This test is used for clinical purposes. It should not be regarded as investigational or for research. This laboratory is certified under the Clinical Laboratory Improvement Amendments of  1988 (CLIA-88) as qualified to perform high complexity clinical laboratory testing.  The controls stained appropriately.   POCT urine pregnancy   Collection Time: 04/20/21  3:44 PM  Result Value Ref Range   Preg Test, Ur Negative Negative   No results found.     Assessment and Plan:      1. Mild dysplasia of cervix (CIN I) - repeat pap in 1 year      I discussed the assessment and treatment plan with the patient. The patient was provided an opportunity to ask questions and all were answered. The patient agreed with the plan and demonstrated an understanding of the instructions.   The patient was advised to call back or seek an in-person evaluation/go to the ED if the symptoms worsen or if the condition fails to improve as anticipated.  I have spent a total of 15 minutes of face-to-face time, excluding clinical staff time, reviewing notes and preparing to see patient, ordering tests and/or medications, and counseling the patient.   Coral Ceo, MD [facility], [facility] 05/04/21

## 2021-06-07 ENCOUNTER — Ambulatory Visit (HOSPITAL_COMMUNITY)
Admission: EM | Admit: 2021-06-07 | Discharge: 2021-06-07 | Discharge status: Against Medical Advice | Payer: Medicaid Other

## 2021-06-08 ENCOUNTER — Other Ambulatory Visit: Payer: Self-pay

## 2021-06-08 ENCOUNTER — Ambulatory Visit (INDEPENDENT_AMBULATORY_CARE_PROVIDER_SITE_OTHER): Payer: Medicaid Other

## 2021-06-08 VITALS — BP 121/76 | HR 84 | Ht 64.0 in | Wt 137.0 lb

## 2021-06-08 DIAGNOSIS — Z3201 Encounter for pregnancy test, result positive: Secondary | ICD-10-CM

## 2021-06-08 LAB — POCT URINE PREGNANCY: Preg Test, Ur: POSITIVE — AB

## 2021-06-08 NOTE — Progress Notes (Signed)
Carrie Hampton presents today for UPT. She has no unusual complaints.  LMP:04/23/2021    OBJECTIVE: Appears well, in no apparent distress.  OB History     Gravida  7   Para  2   Term  1   Preterm  0   AB  4   Living  1      SAB  2   IAB  2   Ectopic  0   Multiple  0   Live Births  1          Home UPT Result: POSITIVE In-Office UPT result: POSITIVE  I have reviewed the patient's medical, obstetrical, social, and family histories, and medications.   ASSESSMENT: Positive pregnancy test LMP  04/23/2021 EDD  01/28/2022 GA     [redacted]w[redacted]d   PLAN Prenatal care to be completed at: [facility]

## 2021-06-16 ENCOUNTER — Other Ambulatory Visit: Payer: Self-pay | Admitting: Obstetrics

## 2021-06-16 MED ORDER — PRENATE DHA 18-0.6-0.4-300 MG PO CAPS: 1.0000 | ORAL_CAPSULE | Freq: Every day | ORAL | 11 refills | Status: DC

## 2021-07-06 ENCOUNTER — Encounter: Payer: Medicaid Other | Admitting: Obstetrics and Gynecology

## 2021-10-17 ENCOUNTER — Encounter (HOSPITAL_COMMUNITY): Payer: Self-pay | Admitting: Emergency Medicine

## 2021-10-17 ENCOUNTER — Ambulatory Visit (HOSPITAL_COMMUNITY)
Admission: EM | Admit: 2021-10-17 | Discharge: 2021-10-17 | Disposition: A | Discharge status: Home / Self Care | Payer: Medicaid Other | Attending: Internal Medicine | Admitting: Internal Medicine

## 2021-10-17 ENCOUNTER — Other Ambulatory Visit: Payer: Self-pay

## 2021-10-17 DIAGNOSIS — M549 Dorsalgia, unspecified: Secondary | ICD-10-CM

## 2021-10-17 DIAGNOSIS — T148XXA Other injury of unspecified body region, initial encounter: Secondary | ICD-10-CM

## 2021-10-17 MED ORDER — KETOROLAC TROMETHAMINE 30 MG/ML IJ SOLN
30.0000 mg | Freq: Once | INTRAMUSCULAR | Status: AC
  Administered 2021-10-17: 30 mg via INTRAMUSCULAR

## 2021-10-17 MED ORDER — KETOROLAC TROMETHAMINE 30 MG/ML IJ SOLN
INTRAMUSCULAR | Status: AC
  Filled 2021-10-17: qty 1

## 2021-10-17 MED ORDER — METHOCARBAMOL 500 MG PO TABS: 500.0000 mg | ORAL_TABLET | Freq: Four times a day (QID) | ORAL | 0 refills | Status: DC | PRN

## 2021-10-17 MED ORDER — IBUPROFEN 800 MG PO TABS: 800.0000 mg | ORAL_TABLET | Freq: Three times a day (TID) | ORAL | 0 refills | Status: DC | PRN

## 2021-10-17 NOTE — ED Provider Notes (Signed)
[facility]    CSN: [id number] Arrival date & time: 10/17/21  0932      History   Chief Complaint Chief Complaint  Patient presents with   Back Pain    HPI Carrie Hampton is a 30 y.o. female presents to urgent care today with complaints of left upper back pain x1 week.  No patient denies any known injury to area.  Describes left upper back pain near scapula radiating up to left shoulder persistent x1 week and worse with any movement.  Having to sleep on opposite side due to discomfort.  Tried BC powder with little improvement.  She denies any recent fever, chills, abdominal pain, nausea/vomiting, dysuria.   Past Medical History:  Diagnosis Date   Hypertension    Premature rupture of membranes 11/03/2017   Patient had PROM at 19 weeks.    STD (female) 2013   Chlamydia    Patient Active Problem List   Diagnosis Date Noted   [redacted] weeks gestation of pregnancy 10/23/2019   Labor and delivery, indication for care 10/20/2019   GBS (group B Streptococcus carrier), +RV culture, currently pregnant 10/10/2019   Marginal insertion of umbilical cord affecting management of mother 09/03/2019   Cervical cerclage suture present 04/22/2019   Transient hypertension 04/22/2019   History of pre-eclampsia in prior pregnancy, currently pregnant 03/28/2019   Supervision of high-risk pregnancy 03/24/2019   History of cervical incompetence 03/24/2019    Past Surgical History:  Procedure Laterality Date   CERVICAL CERCLAGE N/A 04/22/2019   Procedure: CERCLAGE CERVICAL;  Surgeon: Hughes Bing, MD;  Location: [facility];  Service: Gynecology;  Laterality: N/A;   TOOTH EXTRACTION      OB History     Gravida  7   Para  2   Term  1   Preterm  0   AB  4   Living  1      SAB  2   IAB  2   Ectopic  0   Multiple  0   Live Births  1            Home Medications    Prior to Admission medications   Medication Sig Start Date End Date Taking? Authorizing  Provider  ibuprofen (ADVIL) 800 MG tablet Take 1 tablet (800 mg total) by mouth every 8 (eight) hours as needed. 10/17/21  Yes Rolla Etienne, NP  methocarbamol (ROBAXIN) 500 MG tablet Take 1 tablet (500 mg total) by mouth every 6 (six) hours as needed for muscle spasms. 10/17/21  Yes Rolla Etienne, NP  bacitracin-polymyxin b (POLYSPORIN) ophthalmic ointment Place 1 application into the left eye 2 (two) times daily. apply to eye every 12 hours while awake Patient not taking: No sig reported 05/19/20   Merrilee Jansky, MD  cephALEXin (KEFLEX) 500 MG capsule 1 cap po bid x 7 days Patient not taking: No sig reported 04/16/20   Muthersbaugh, Dahlia Client, PA-C  ketorolac (ACULAR) 0.5 % ophthalmic solution Place 1 drop into the left eye every 6 (six) hours. Patient not taking: No sig reported 05/19/20   Merrilee Jansky, MD  metroNIDAZOLE (FLAGYL) 500 MG tablet Take 1 tablet (500 mg total) by mouth 2 (two) times daily. Patient not taking: No sig reported 03/09/21   Brock Bad, MD  phenazopyridine (PYRIDIUM) 200 MG tablet Take 1 tablet (200 mg total) by mouth 3 (three) times daily as needed for pain. Patient not taking: No sig reported 04/16/20   Muthersbaugh,  Hannah, PA-C  Prenat-FeAsp-Meth-FA-DHA w/o A (PRENATE DHA) 18-0.6-0.4-300 MG CAPS Take 1 tablet by mouth daily. 06/16/21   Shelly Bombard, MD  amLODipine (NORVASC) 2.5 MG tablet Take 1 tablet (2.5 mg total) by mouth daily. 10/23/19 01/05/20  Chauncey Mann, MD    Family History Family History  Problem Relation Age of Onset   Healthy Mother    Healthy Father    Alcohol abuse Neg Hx    Arthritis Neg Hx    Asthma Neg Hx    Birth defects Neg Hx    Cancer Neg Hx    COPD Neg Hx    Depression Neg Hx    Diabetes Neg Hx    Drug abuse Neg Hx    Early death Neg Hx    Hearing loss Neg Hx    Heart disease Neg Hx    Hyperlipidemia Neg Hx    Hypertension Neg Hx    Kidney disease Neg Hx    Learning disabilities Neg Hx    Mental illness Neg Hx     Mental retardation Neg Hx    Miscarriages / Stillbirths Neg Hx    Stroke Neg Hx    Vision loss Neg Hx     Social History Social History   Tobacco Use   Smoking status: Former    Packs/day: 1.00    Types: Cigars, Cigarettes    Quit date: 10/20/2012    Years since quitting: 8.9   Smokeless tobacco: Never  Vaping Use   Vaping Use: Never used  Substance Use Topics   Alcohol use: No    Comment: socially   Drug use: No    Types: Marijuana    Comment: none per pt 03/24/19     Allergies   Patient has no known allergies.   Review of Systems As stated in HPI otherwise negative   Physical Exam Triage Vital Signs ED Triage Vitals [10/17/21 1038]  Enc Vitals Group     BP 127/84     Pulse Rate 84     Resp 16     Temp 98.3 F (36.8 C)     Temp Source Oral     SpO2 98 %     Weight      Height      Head Circumference      Peak Flow      Pain Score 9     Pain Loc      Pain Edu?      Excl. in [place]?    No data found.  Updated Vital Signs BP 127/84   Pulse 84   Temp 98.3 F (36.8 C) (Oral)   Resp 16   LMP 10/03/2021 (Approximate)   SpO2 98%   Breastfeeding No   Visual Acuity Right Eye Distance:   Left Eye Distance:   Bilateral Distance:    Right Eye Near:   Left Eye Near:    Bilateral Near:     Physical Exam Constitutional:      General: She is not in acute distress.    Appearance: Normal appearance. She is normal weight. She is not ill-appearing or toxic-appearing.  Cardiovascular:     Rate and Rhythm: Normal rate and regular rhythm.     Heart sounds: Normal heart sounds. No murmur heard.   No friction rub. No gallop.  Pulmonary:     Breath sounds: Normal breath sounds. No wheezing, rhonchi or rales.     Comments: Decreased inspiratory effort due to pain Abdominal:  General: Bowel sounds are normal.     Palpations: Abdomen is soft.  Musculoskeletal:        General: Tenderness present.     Cervical back: Normal range of motion and neck  supple. No tenderness.     Comments: Tenderness upon palpation in area medial to left scapula extending up to left trapezius muscle.  Discomfort exacerbated with full arm ROM  Skin:    General: Skin is warm and dry.     Findings: No bruising, lesion or rash.  Neurological:     General: No focal deficit present.     Mental Status: She is alert and oriented to person, place, and time.  Psychiatric:        Mood and Affect: Mood normal.        Behavior: Behavior normal.     UC Treatments / Results  Labs (all labs ordered are listed, but only abnormal results are displayed) Labs Reviewed - No data to display  EKG   Radiology No results found.  Procedures Procedures (including critical care time)  Medications Ordered in UC Medications  ketorolac (TORADOL) 30 MG/ML injection 30 mg (30 mg Intramuscular Given 10/17/21 1109)    Initial Impression / Assessment and Plan / UC Course  I have reviewed the triage vital signs and the nursing notes.  Pertinent labs & imaging results that were available during my care of the patient were reviewed by me and considered in my medical decision making (see chart for details).  Muscle strain, upper back, shoulder -Exam consistent with strain of left trapezius muscle.  Frequent lifting of toddler.  Symptoms improved with IM Toradol in office -No associated GI symptoms/findings suggestive of a cholecystitis. -Ibuprofen 800 mg, Robaxin as needed -Follow-up for persistent or worsening symptoms  Reviewed expections re: course of current medical issues. Questions answered. Outlined signs and symptoms indicating need for more acute intervention. Pt verbalized understanding. AVS given  Final Clinical Impressions(s) / UC Diagnoses   Final diagnoses:  Muscle strain  Acute left-sided back pain, unspecified back location     Discharge Instructions      Your symptoms are felt to be related to muscle tension or muscle strain.  I am prescribing  you an anti-inflammatory medication as well as a muscle relaxer.  As we discussed, do not start taking the ibuprofen until 24 hours after your injection today.  The muscle relaxer may make you sleepy so no driving after taking this medication.  Use heating pad as needed and light stretching.  Please return for any worsening or continued symptoms.     ED Prescriptions     Medication Sig Dispense Auth. Provider   ibuprofen (ADVIL) 800 MG tablet Take 1 tablet (800 mg total) by mouth every 8 (eight) hours as needed. 30 tablet Rudolpho Sevin, NP   methocarbamol (ROBAXIN) 500 MG tablet Take 1 tablet (500 mg total) by mouth every 6 (six) hours as needed for muscle spasms. 30 tablet Rudolpho Sevin, NP      PDMP not reviewed this encounter.   Rudolpho Sevin, NP 10/17/21 1151

## 2021-10-17 NOTE — ED Triage Notes (Signed)
Pt c/o back pain that started 1 week ago. States pain is in the L upper part of her back. Denies injury.

## 2021-10-17 NOTE — Discharge Instructions (Addendum)
Your symptoms are felt to be related to muscle tension or muscle strain.  I am prescribing you an anti-inflammatory medication as well as a muscle relaxer.  As we discussed, do not start taking the ibuprofen until 24 hours after your injection today.  The muscle relaxer may make you sleepy so no driving after taking this medication.  Use heating pad as needed and light stretching.  Please return for any worsening or continued symptoms.

## 2021-10-31 ENCOUNTER — Other Ambulatory Visit: Payer: Self-pay

## 2021-10-31 ENCOUNTER — Ambulatory Visit (INDEPENDENT_AMBULATORY_CARE_PROVIDER_SITE_OTHER): Payer: Medicaid Other | Admitting: *Deleted

## 2021-10-31 ENCOUNTER — Other Ambulatory Visit (HOSPITAL_COMMUNITY)
Admission: RE | Admit: 2021-10-31 | Discharge: 2021-10-31 | Disposition: A | Discharge status: Home / Self Care | Payer: Medicaid Other | Source: Ambulatory Visit | Attending: Obstetrics and Gynecology | Admitting: Obstetrics and Gynecology

## 2021-10-31 DIAGNOSIS — Z113 Encounter for screening for infections with a predominantly sexual mode of transmission: Secondary | ICD-10-CM

## 2021-10-31 DIAGNOSIS — Z3202 Encounter for pregnancy test, result negative: Secondary | ICD-10-CM

## 2021-10-31 LAB — POCT URINE PREGNANCY: Preg Test, Ur: NEGATIVE

## 2021-10-31 NOTE — Progress Notes (Signed)
Ms. Elizardo presents today for UPT. She has no unusual complaints. LMP:    OBJECTIVE: Appears well, in no apparent distress.  OB History     Gravida  7   Para  2   Term  1   Preterm  0   AB  4   Living  1      SAB  2   IAB  2   Ectopic  0   Multiple  0   Live Births  1          Home UPT Result: NA In-Office UPT result: negative I have reviewed the patient's medical, obstetrical, social, and family histories, and medications.   ASSESSMENT: Negative pregnancy test  PLAN Repeat UPT as needed for missed menses or pregnancy symptoms   SUBJECTIVE:  30 y.o. female who desires a STI screen. Denies abnormal vaginal discharge, bleeding or significant pelvic pain. No UTI symptoms. Denies history of known exposure to STD.  Patient's last menstrual period was 10/03/2021 (approximate).  OBJECTIVE:  She appears well.   ASSESSMENT:  STI Screen   PLAN:  Pt offered STI blood screening-declined GC, chlamydia, and trichomonas probe sent to lab.  Treatment: To be determined once lab results are received.  Pt follow up as needed.

## 2021-11-01 LAB — CERVICOVAGINAL ANCILLARY ONLY
Chlamydia: NEGATIVE
Comment: NEGATIVE
Comment: NEGATIVE
Comment: NORMAL
Neisseria Gonorrhea: NEGATIVE
Trichomonas: NEGATIVE

## 2021-11-16 ENCOUNTER — Ambulatory Visit (INDEPENDENT_AMBULATORY_CARE_PROVIDER_SITE_OTHER): Payer: Medicaid Other

## 2021-11-16 ENCOUNTER — Other Ambulatory Visit (HOSPITAL_COMMUNITY)
Admission: RE | Admit: 2021-11-16 | Discharge: 2021-11-16 | Disposition: A | Discharge status: Home / Self Care | Payer: Medicaid Other | Source: Ambulatory Visit | Attending: Obstetrics and Gynecology | Admitting: Obstetrics and Gynecology

## 2021-11-16 VITALS — BP 124/79 | HR 74 | Ht 64.0 in | Wt 140.0 lb

## 2021-11-16 DIAGNOSIS — Z3202 Encounter for pregnancy test, result negative: Secondary | ICD-10-CM | POA: Diagnosis not present

## 2021-11-16 DIAGNOSIS — N898 Other specified noninflammatory disorders of vagina: Secondary | ICD-10-CM | POA: Diagnosis present

## 2021-11-16 LAB — POCT URINE PREGNANCY: Preg Test, Ur: NEGATIVE

## 2021-11-16 NOTE — Progress Notes (Signed)
SUBJECTIVE:  30 y.o. female complains of white, creamy, and malodorous vaginal discharge for 2 week(s). Denies abnormal vaginal bleeding or significant pelvic pain or fever. No UTI symptoms. Denies history of known exposure to STD.  Patient's last menstrual period was 11/04/2021 (exact date).  OBJECTIVE:  She appears well, afebrile. Urine dipstick: not done. UPT done per pt request  ASSESSMENT:  Vaginal Discharge  Vaginal Odor   PLAN:  GC, chlamydia, trichomonas, BVAG, CVAG probe sent to lab. Treatment: To be determined once lab results are received ROV prn if symptoms persist or worsen.

## 2021-11-17 LAB — CERVICOVAGINAL ANCILLARY ONLY
Bacterial Vaginitis (gardnerella): POSITIVE — AB
Candida Glabrata: NEGATIVE
Candida Vaginitis: NEGATIVE
Chlamydia: NEGATIVE
Comment: NEGATIVE
Comment: NEGATIVE
Comment: NEGATIVE
Comment: NEGATIVE
Comment: NEGATIVE
Comment: NORMAL
Neisseria Gonorrhea: NEGATIVE
Trichomonas: NEGATIVE

## 2021-11-18 ENCOUNTER — Other Ambulatory Visit: Payer: Self-pay | Admitting: *Deleted

## 2021-11-18 DIAGNOSIS — N760 Acute vaginitis: Secondary | ICD-10-CM

## 2021-11-18 MED ORDER — METRONIDAZOLE 500 MG PO TABS: 500.0000 mg | ORAL_TABLET | Freq: Two times a day (BID) | ORAL | 0 refills | Status: DC

## 2021-11-18 NOTE — Progress Notes (Signed)
Flagyl sent for +BV. See lab results 

## 2022-04-26 ENCOUNTER — Ambulatory Visit: Payer: Medicaid Other | Admitting: Obstetrics

## 2023-02-04 ENCOUNTER — Emergency Department (HOSPITAL_COMMUNITY)
Admission: EM | Admit: 2023-02-04 | Discharge: 2023-02-04 | Disposition: A | Discharge status: Home / Self Care | Payer: Medicaid Other | Attending: Emergency Medicine | Admitting: Emergency Medicine

## 2023-02-04 DIAGNOSIS — R112 Nausea with vomiting, unspecified: Secondary | ICD-10-CM | POA: Diagnosis present

## 2023-02-04 DIAGNOSIS — R109 Unspecified abdominal pain: Secondary | ICD-10-CM | POA: Diagnosis not present

## 2023-02-04 DIAGNOSIS — R7309 Other abnormal glucose: Secondary | ICD-10-CM | POA: Insufficient documentation

## 2023-02-04 DIAGNOSIS — R197 Diarrhea, unspecified: Secondary | ICD-10-CM | POA: Diagnosis not present

## 2023-02-04 DIAGNOSIS — I10 Essential (primary) hypertension: Secondary | ICD-10-CM | POA: Diagnosis not present

## 2023-02-04 LAB — CBC WITH DIFFERENTIAL/PLATELET
Abs Immature Granulocytes: 0.02 10*3/uL (ref 0.00–0.07)
Basophils Absolute: 0 10*3/uL (ref 0.0–0.1)
Basophils Relative: 0 %
Eosinophils Absolute: 0.1 10*3/uL (ref 0.0–0.5)
Eosinophils Relative: 1 %
HCT: 44.8 % (ref 36.0–46.0)
Hemoglobin: 14.8 g/dL (ref 12.0–15.0)
Immature Granulocytes: 0 %
Lymphocytes Relative: 15 %
Lymphs Abs: 1.2 10*3/uL (ref 0.7–4.0)
MCH: 28.4 pg (ref 26.0–34.0)
MCHC: 33 g/dL (ref 30.0–36.0)
MCV: 85.8 fL (ref 80.0–100.0)
Monocytes Absolute: 0.3 10*3/uL (ref 0.1–1.0)
Monocytes Relative: 3 %
Neutro Abs: 6.2 10*3/uL (ref 1.7–7.7)
Neutrophils Relative %: 81 %
Platelets: 232 10*3/uL (ref 150–400)
RBC: 5.22 MIL/uL — ABNORMAL HIGH (ref 3.87–5.11)
RDW: 12.2 % (ref 11.5–15.5)
WBC: 7.8 10*3/uL (ref 4.0–10.5)
nRBC: 0 % (ref 0.0–0.2)

## 2023-02-04 LAB — COMPREHENSIVE METABOLIC PANEL
ALT: 13 U/L (ref 0–44)
AST: 18 U/L (ref 15–41)
Albumin: 3.8 g/dL (ref 3.5–5.0)
Alkaline Phosphatase: 58 U/L (ref 38–126)
Anion gap: 9 (ref 5–15)
BUN: 9 mg/dL (ref 6–20)
CO2: 24 mmol/L (ref 22–32)
Calcium: 9 mg/dL (ref 8.9–10.3)
Chloride: 104 mmol/L (ref 98–111)
Creatinine, Ser: 0.81 mg/dL (ref 0.44–1.00)
GFR, Estimated: >60 mL/min (ref >60)
Glucose, Bld: 108 mg/dL — ABNORMAL HIGH (ref 70–99)
Potassium: 3.7 mmol/L (ref 3.5–5.1)
Sodium: 137 mmol/L (ref 135–145)
Total Bilirubin: 0.5 mg/dL (ref 0.3–1.2)
Total Protein: 6.9 g/dL (ref 6.5–8.1)

## 2023-02-04 LAB — HCG, QUANTITATIVE, PREGNANCY: hCG, Beta Chain, Quant, S: <1 m[IU]/mL (ref <5)

## 2023-02-04 LAB — LIPASE, BLOOD: Lipase: 37 U/L (ref 11–51)

## 2023-02-04 MED ORDER — LOPERAMIDE HCL 2 MG PO CAPS
4.0000 mg | ORAL_CAPSULE | Freq: Once | ORAL | Status: AC
  Administered 2023-02-04: 4 mg via ORAL
  Filled 2023-02-04: qty 2

## 2023-02-04 MED ORDER — SODIUM CHLORIDE 0.9 % IV BOLUS
1000.0000 mL | Freq: Once | INTRAVENOUS | Status: AC
  Administered 2023-02-04: 1000 mL via INTRAVENOUS

## 2023-02-04 MED ORDER — MORPHINE SULFATE (PF) 4 MG/ML IV SOLN
4.0000 mg | Freq: Once | INTRAVENOUS | Status: AC
  Administered 2023-02-04: 4 mg via INTRAVENOUS
  Filled 2023-02-04: qty 1

## 2023-02-04 MED ORDER — ONDANSETRON 8 MG PO TBDP: 8.0000 mg | ORAL_TABLET | Freq: Three times a day (TID) | ORAL | 0 refills | Status: DC | PRN

## 2023-02-04 MED ORDER — ONDANSETRON HCL 4 MG/2ML IJ SOLN
4.0000 mg | Freq: Once | INTRAMUSCULAR | Status: AC
  Administered 2023-02-04: 4 mg via INTRAVENOUS
  Filled 2023-02-04: qty 2

## 2023-02-04 NOTE — ED Provider Notes (Signed)
[facility] Provider Note   CSN: [id number] Arrival date & time: 02/04/23  0340     History  Chief Complaint  Patient presents with   Abdominal Pain    Carrie Hampton is a 32 y.o. female.  The history is provided by the patient.  Abdominal Pain She has history of hypertension and comes in because of abdominal pain and vomiting and diarrhea which started this afternoon.  Symptoms started several hours after eating at a restaurant.  Her mother ate at the same restaurant and had some nausea but did not vomit.  She denies fever or chills.  Abdominal pain is generalized and only minimally improved with emesis.  She denies any blood or mucus in stool or emesis.   Home Medications Prior to Admission medications   Medication Sig Start Date End Date Taking? Authorizing Provider  bacitracin-polymyxin b (POLYSPORIN) ophthalmic ointment Place 1 application into the left eye 2 (two) times daily. apply to eye every 12 hours while awake Patient not taking: Reported on 04/20/2021 05/19/20   Chase Picket, MD  cephALEXin (KEFLEX) 500 MG capsule 1 cap po bid x 7 days Patient not taking: Reported on 04/20/2021 04/16/20   Muthersbaugh, Jarrett Soho, PA-C  ibuprofen (ADVIL) 800 MG tablet Take 1 tablet (800 mg total) by mouth every 8 (eight) hours as needed. Patient not taking: Reported on 11/16/2021 10/17/21   Rudolpho Sevin, NP  ketorolac (ACULAR) 0.5 % ophthalmic solution Place 1 drop into the left eye every 6 (six) hours. Patient not taking: Reported on 04/20/2021 05/19/20   Chase Picket, MD  methocarbamol (ROBAXIN) 500 MG tablet Take 1 tablet (500 mg total) by mouth every 6 (six) hours as needed for muscle spasms. Patient not taking: Reported on 11/16/2021 10/17/21   Rudolpho Sevin, NP  metroNIDAZOLE (FLAGYL) 500 MG tablet Take 1 tablet (500 mg total) by mouth 2 (two) times daily. 11/18/21   Chancy Milroy, MD  phenazopyridine (PYRIDIUM) 200 MG tablet  Take 1 tablet (200 mg total) by mouth 3 (three) times daily as needed for pain. Patient not taking: Reported on 04/20/2021 04/16/20   Muthersbaugh, Jarrett Soho, PA-C  Prenat-FeAsp-Meth-FA-DHA w/o A (PRENATE DHA) 18-0.6-0.4-300 MG CAPS Take 1 tablet by mouth daily. Patient not taking: Reported on 11/16/2021 06/16/21   Shelly Bombard, MD  amLODipine (NORVASC) 2.5 MG tablet Take 1 tablet (2.5 mg total) by mouth daily. 10/23/19 01/05/20  Chauncey Mann, MD      Allergies    Patient has no known allergies.    Review of Systems   Review of Systems  Gastrointestinal:  Positive for abdominal pain.  All other systems reviewed and are negative.   Physical Exam Updated Vital Signs BP 109/67 (BP Location: Right Arm)   Pulse 74   Temp 98.6 F (37 C) (Oral)   Resp 18   Ht '5\' 4"'$  (1.626 m)   Wt 63.5 kg   LMP 01/28/2023   SpO2 100%   BMI 24.03 kg/m  Physical Exam Vitals and nursing note reviewed.   32 year old female, resting comfortably and in no acute distress. Vital signs are normal. Oxygen saturation is 100%, which is normal. Head is normocephalic and atraumatic. PERRLA, EOMI. Oropharynx is clear. Neck is nontender and supple without adenopathy or JVD. Back is nontender and there is no CVA tenderness. Lungs are clear without rales, wheezes, or rhonchi. Chest is nontender. Heart has regular rate and rhythm without murmur. Abdomen is  soft, flat, nontender.  Peristalsis is hypoactive. Extremities have no cyanosis or edema, full range of motion is present. Skin is warm and dry without rash. Neurologic: Mental status is normal, cranial nerves are intact, moves all extremities equally.  ED Results / Procedures / Treatments   Labs (all labs ordered are listed, but only abnormal results are displayed) Labs Reviewed  CBC WITH DIFFERENTIAL/PLATELET - Abnormal; Notable for the following components:      Result Value   RBC 5.22 (*)    All other components within normal limits  COMPREHENSIVE  METABOLIC PANEL - Abnormal; Notable for the following components:   Glucose, Bld 108 (*)    All other components within normal limits  LIPASE, BLOOD  HCG, QUANTITATIVE, PREGNANCY  CBC WITH DIFFERENTIAL/PLATELET  I-STAT BETA HCG BLOOD, ED ([facility], [facility], [facility] ONLY)    Procedures Procedures    Medications Ordered in ED Medications  sodium chloride 0.9 % bolus 1,000 mL (0 mLs Intravenous Stopped 02/04/23 0617)  ondansetron (ZOFRAN) injection 4 mg (4 mg Intravenous Given 02/04/23 0431)  morphine (PF) 4 MG/ML injection 4 mg (4 mg Intravenous Given 02/04/23 0432)  loperamide (IMODIUM) capsule 4 mg (4 mg Oral Given 02/04/23 0434)    ED Course/ Medical Decision Making/ A&P                             Medical Decision Making Amount and/or Complexity of Data Reviewed Labs: ordered.  Risk Prescription drug management.   Abdominal pain with vomiting and diarrhea and pattern most suggestive of viral gastroenteritis, consider food poisoning.  Doubt bowel obstruction, appendicitis, cholecystitis.  I have ordered IV fluids, morphine, ondansetron, oral loperamide.  I have ordered laboratory workup of CBC, comprehensive metabolic panel, lipase, pregnancy test.  Patient feels much better following above-noted treatment.  I have reviewed and interpreted her laboratory tests, and my interpretation is borderline elevated random glucose and otherwise normal metabolic panel and CBC.  I am discharging her with a prescription for ondansetron oral dissolving tablet and instructed to use over-the-counter loperamide as needed for recurrent diarrhea.  Return precautions discussed.  Final Clinical Impression(s) / ED Diagnoses Final diagnoses:  Nausea vomiting and diarrhea  Elevated random blood glucose level    Rx / DC Orders ED Discharge Orders          Ordered    ondansetron (ZOFRAN-ODT) 8 MG disintegrating tablet  Every 8 hours PRN        02/04/23 [id number]              Delora Fuel, [place] 123456 [phone]

## 2023-02-04 NOTE — ED Triage Notes (Signed)
Patient arrived with complaints of generalized abdominal pain, NVD over the last hour.

## 2023-02-04 NOTE — Discharge Instructions (Signed)
Take loperamide (Imodium A-D) as needed for diarrhea.  Make sure to drink plenty of fluids.  Return if symptoms or not being adequately controlled at home.

## 2023-02-04 NOTE — ED Notes (Signed)
AVS with prescriptions provided to and discussed with patient and family member at bedside. Pt verbalizes understanding of discharge instructions and denies any questions or concerns at this time. Pt has ride home. Pt ambulated out of department independently with steady gait.  

## 2023-03-16 ENCOUNTER — Telehealth: Payer: Self-pay | Admitting: Hematology

## 2023-03-16 NOTE — Telephone Encounter (Signed)
scheduled per 4/18 referral, pt has been called and confirmed date and time. Pt is aware of location and to arrive early for check in   

## 2023-03-21 ENCOUNTER — Inpatient Hospital Stay: Payer: Medicaid Other | Attending: Hematology | Admitting: Hematology

## 2023-03-21 ENCOUNTER — Inpatient Hospital Stay: Payer: Medicaid Other

## 2023-03-21 ENCOUNTER — Encounter: Payer: Self-pay | Admitting: Hematology

## 2023-03-21 VITALS — BP 127/85 | HR 77 | Temp 98.3°F | Resp 18 | Ht 64.0 in | Wt 150.0 lb

## 2023-03-21 DIAGNOSIS — Z01818 Encounter for other preprocedural examination: Secondary | ICD-10-CM

## 2023-03-21 DIAGNOSIS — R791 Abnormal coagulation profile: Secondary | ICD-10-CM

## 2023-03-21 DIAGNOSIS — Z87891 Personal history of nicotine dependence: Secondary | ICD-10-CM | POA: Diagnosis not present

## 2023-03-21 LAB — APTT: aPTT: 36 seconds (ref 24–36)

## 2023-03-21 NOTE — Progress Notes (Addendum)
[facility]   [phone] [phone]   Clinic New Consult Note   Patient Care Team: Patient, No Pcp Per as PCP - General (General Practice)  Date of Service:  03/21/2023   CHIEF COMPLAINTS/PURPOSE OF CONSULTATION:  Elevated APTT  REFERRING PHYSICIAN:  Eliezer Lofts, MD  HISTORY OF PRESENTING ILLNESS:  Carrie Hampton 32 y.o. female is a here because of thromboplastin . The patient was referred by Eliezer Lofts, MD. The patient presents to the clinic today alone. .  Patient is scheduled to have liposuction on Apr 05, 2023.  She was seen by her primary care physician for preop clearance.  Lab reviewed normal PT/INR, slightly elevated APTT at 34 (normal 24-33).  She was referred to [place] for further evaluation.  Denies any history of significant bleeding.  She had a tooth extraction, never had a surgery, had vaginal delivery, no excessive bleeding noticed after those procedures.  She has normal menstrual period, not heavy.  She denies any easy bruising or mucosal bleeding.   She denies any history of thrombosis.  She had multiple pregnancies, 3 pregnancy loss in early term, possible related to cervical issue.  Her past medical history is negative except mild elevated blood pressure during her pregnancy.  She has a 9-year-old healthy boy.   Socially... 1 child Former smoker   REVIEW OF SYSTEMS:   Constitutional: Denies fevers, chills or abnormal night sweats Eyes: Denies blurriness of vision, double vision or watery eyes Ears, nose, mouth, throat, and face: Denies mucositis or sore throat Respiratory: Denies cough, dyspnea or wheezes Cardiovascular: Denies palpitation, chest discomfort or lower extremity swelling Gastrointestinal:  Denies nausea, heartburn or change in bowel habits Skin: Denies abnormal skin rashes Lymphatics: Denies new lymphadenopathy or easy bruising Neurological:Denies numbness, tingling or new weaknesses Behavioral/Psych:  Mood is stable, no new changes  All other systems were reviewed with the patient and are negative.   MEDICAL HISTORY:  Past Medical History:  Diagnosis Date   Hypertension    Premature rupture of membranes 11/03/2017   Patient had PROM at 19 weeks.    STD (female) 2013   Chlamydia    SURGICAL HISTORY: Past Surgical History:  Procedure Laterality Date   CERVICAL CERCLAGE N/A 04/22/2019   Procedure: CERCLAGE CERVICAL;  Surgeon: Hewitt Bing, MD;  Location: [facility];  Service: Gynecology;  Laterality: N/A;   TOOTH EXTRACTION      SOCIAL HISTORY: Social History   Socioeconomic History   Marital status: Single    Spouse name: Not on file   Number of children: 1   Years of education: Not on file   Highest education level: Not on file  Occupational History   Not on file  Tobacco Use   Smoking status: Former    Packs/day: 1.00    Years: 5.00    Additional pack years: 0.00    Total pack years: 5.00    Types: Cigars, Cigarettes    Quit date: 10/20/2012    Years since quitting: 10.4   Smokeless tobacco: Never  Vaping Use   Vaping Use: Never used  Substance and Sexual Activity   Alcohol use: No    Comment: socially   Drug use: No    Types: Marijuana    Comment: none per pt 03/24/19   Sexual activity: Yes    Partners: Male    Birth control/protection: None    Comment: Pregnant   Other Topics Concern   Not on file  Social History Narrative  Not on file   Social Determinants of Health   Financial Resource Strain: Low Risk  (04/08/2019)   Overall Financial Resource Strain (CARDIA)    Difficulty of Paying Living Expenses: Not hard at all  Food Insecurity: No Food Insecurity (04/08/2019)   Hunger Vital Sign    Worried About Running Out of Food in the Last Year: Never true    Ran Out of Food in the Last Year: Never true  Transportation Needs: Unknown (04/08/2019)   PRAPARE - Administrator, Civil Service (Medical): No    Lack of Transportation  (Non-Medical): Not on file  Physical Activity: Not on file  Stress: No Stress Concern Present (04/08/2019)   [employer] of Occupational Health - Occupational Stress Questionnaire    Feeling of Stress : Only a little  Social Connections: Not on file  Intimate Partner Violence: Not on file    FAMILY HISTORY: Family History  Problem Relation Age of Onset   Healthy Mother    Healthy Father    Alcohol abuse Neg Hx    Arthritis Neg Hx    Asthma Neg Hx    Birth defects Neg Hx    Cancer Neg Hx    COPD Neg Hx    Depression Neg Hx    Diabetes Neg Hx    Drug abuse Neg Hx    Early death Neg Hx    Hearing loss Neg Hx    Heart disease Neg Hx    Hyperlipidemia Neg Hx    Hypertension Neg Hx    Kidney disease Neg Hx    Learning disabilities Neg Hx    Mental illness Neg Hx    Mental retardation Neg Hx    Miscarriages / Stillbirths Neg Hx    Stroke Neg Hx    Vision loss Neg Hx     ALLERGIES:  has No Known Allergies.  MEDICATIONS:  Current Outpatient Medications  Medication Sig Dispense Refill   ondansetron (ZOFRAN-ODT) 8 MG disintegrating tablet Take 1 tablet (8 mg total) by mouth every 8 (eight) hours as needed for nausea or vomiting. 20 tablet 0   No current facility-administered medications for this visit.    PHYSICAL EXAMINATION: ECOG PERFORMANCE STATUS: 0 - Asymptomatic  Vitals:   03/21/23 1536  BP: 127/85  Pulse: 77  Resp: 18  Temp: 98.3 F (36.8 C)  SpO2: 100%   Filed Weights   03/21/23 1536  Weight: 68 kg     GENERAL:alert, no distress and comfortable SKIN: skin color normal, no rashes or significant lesions EYES: normal, Conjunctiva are pink and non-injected, sclera clear  NEURO: alert & oriented x 3 with fluent speech NECK:(-)  supple, thyroid normal size, non-tender, without nodularity LYMPH:(-)   no palpable lymphadenopathy in the cervical, axillary   LABORATORY DATA:  I have reviewed the data as listed    Latest Ref Rng & Units 02/04/2023     4:06 AM 10/20/2019    5:37 PM 08/06/2019    9:19 AM  CBC  WBC 4.0 - 10.5 K/uL 7.8  8.9  8.1   Hemoglobin 12.0 - 15.0 g/dL 16.1  09.6  04.5   Hematocrit 36.0 - 46.0 % 44.8  40.8  37.8   Platelets 150 - 400 K/uL 232  155  151        Latest Ref Rng & Units 02/04/2023    4:06 AM 08/02/2017    3:23 PM 04/26/2017    9:40 AM  CMP  Glucose 70 -  99 mg/dL 366  74  440   BUN 6 - 20 mg/dL 9  6  9    Creatinine 0.44 - 1.00 mg/dL 3.47  4.25  9.56   Sodium 135 - 145 mmol/L 137  136  143   Potassium 3.5 - 5.1 mmol/L 3.7  3.2  3.7   Chloride 98 - 111 mmol/L 104  104  109   CO2 22 - 32 mmol/L 24  23  25    Calcium 8.9 - 10.3 mg/dL 9.0  9.1  9.2   Total Protein 6.5 - 8.1 g/dL 6.9  6.7  7.3   Total Bilirubin 0.3 - 1.2 mg/dL 0.5  0.9  0.6   Alkaline Phos 38 - 126 U/L 58  54  56   AST 15 - 41 U/L 18  19  24    ALT 0 - 44 U/L 13  10  16       RADIOGRAPHIC STUDIES: I have personally reviewed the radiological images as listed and agreed with the findings in the report. No results found.  ASSESSMENT & PLAN:  Carrie Hampton is a 32 y.o.  female with a history of    Elevated APTT -Found on preop screening lab work, APTT was 74, normal range 24-33.  Her PT/INR was normal, CBC and CMP unremarkable. -No history of significant bleeding or thrombosis. -I will repeat a PTT today, if still elevated, will obtain mixing study. -No high clinical concern for lupus anticoagulant or antiphospholipid syndrome.   PLAN:  -lab order today.  We will repeat a PTT first, if it is abnormal, we will obtain mixing study. -Call her with lab results.    Orders Placed This Encounter  Procedures   APTT   Aptt mixing study screen    All questions were answered. The patient knows to call the clinic with any problems, questions or concerns. The total time spent in the appointment was 30 minutes.     Malachy Mood, MD 03/21/2023 4:21 PM  I, Monica Martinez, am acting as scribe for Malachy Mood, MD.   I have reviewed the  above documentation for accuracy and completeness, and I agree with the above.   Addendum -her APTT came back 36, which is the upper normal limit in our lab. I cancelled her mixing study.  She is cleared to have surgery.  I called her and left a message about the test result.  Malachy Mood  03/24/2023

## 2023-03-23 ENCOUNTER — Other Ambulatory Visit: Payer: Self-pay

## 2023-03-26 ENCOUNTER — Telehealth: Payer: Self-pay

## 2023-03-26 ENCOUNTER — Other Ambulatory Visit: Payer: Self-pay

## 2023-03-26 NOTE — Telephone Encounter (Addendum)
Faxed note to Dr. Francisca December per patient and Dr. Mosetta Putt   ----- Message from Malachy Mood, MD sent at 03/24/2023 10:04 AM EDT ----- I called pt and left her a VM, her APTT was normal., no additional work is needed. Please send my consult note (I made addendum) to her referral physician, thanks   Malachy Mood

## 2023-03-26 NOTE — Telephone Encounter (Signed)
Faxed last office note to Dr. Francisca December as per the patient request. Received fax confirmation of receipt.

## 2023-07-02 ENCOUNTER — Encounter (HOSPITAL_COMMUNITY): Payer: Self-pay | Admitting: Emergency Medicine

## 2023-07-02 ENCOUNTER — Other Ambulatory Visit: Payer: Self-pay

## 2023-07-02 ENCOUNTER — Ambulatory Visit (HOSPITAL_COMMUNITY)
Admission: EM | Admit: 2023-07-02 | Discharge: 2023-07-02 | Disposition: A | Discharge status: Home / Self Care | Payer: Medicaid Other | Attending: Physician Assistant | Admitting: Physician Assistant

## 2023-07-02 DIAGNOSIS — R051 Acute cough: Secondary | ICD-10-CM | POA: Diagnosis present

## 2023-07-02 DIAGNOSIS — J069 Acute upper respiratory infection, unspecified: Secondary | ICD-10-CM | POA: Diagnosis present

## 2023-07-02 DIAGNOSIS — Z1152 Encounter for screening for COVID-19: Secondary | ICD-10-CM | POA: Diagnosis not present

## 2023-07-02 LAB — SARS CORONAVIRUS 2 (TAT 6-24 HRS): SARS Coronavirus 2: NEGATIVE

## 2023-07-02 MED ORDER — IBUPROFEN 600 MG PO TABS: 600.0000 mg | ORAL_TABLET | Freq: Four times a day (QID) | ORAL | 0 refills | Status: AC | PRN

## 2023-07-02 MED ORDER — PROMETHAZINE-DM 6.25-15 MG/5ML PO SYRP: 5.0000 mL | ORAL_SOLUTION | Freq: Three times a day (TID) | ORAL | 0 refills | Status: DC | PRN

## 2023-07-02 NOTE — ED Provider Notes (Signed)
[facility]    CSN: [id number] Arrival date & time: 07/02/23  0930      History   Chief Complaint Chief Complaint  Patient presents with   URI    HPI Carrie Hampton is a 32 y.o. female.   Patient presents today with a 4-day history of URI symptoms.  She reports headache, fatigue, coughing, congestion, body aches, sore throat.  Denies any chest pain, shortness of breath, nausea, vomiting, diarrhea.  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  She has been tested at her urgent care for COVID, flu, strep every day for the past 3 days and been negative for all 3.  She has had COVID in the past with last episode several years ago.  She has had COVID vaccines.  She has not tried any over-the-counter medication for symptom management.  Denies any recent antibiotics or steroids.  Denies any significant past medical history including allergies, asthma, COPD, smoking.  She is comfort that she is not pregnant.    Past Medical History:  Diagnosis Date   Hypertension    Premature rupture of membranes 11/03/2017   Patient had PROM at 19 weeks.    STD (female) 2013   Chlamydia    Patient Active Problem List   Diagnosis Date Noted   [redacted] weeks gestation of pregnancy 10/23/2019   Labor and delivery, indication for care 10/20/2019   GBS (group B Streptococcus carrier), +RV culture, currently pregnant 10/10/2019   Marginal insertion of umbilical cord affecting management of mother 09/03/2019   Cervical cerclage suture present 04/22/2019   Transient hypertension 04/22/2019   History of pre-eclampsia in prior pregnancy, currently pregnant 03/28/2019   History of cervical incompetence 03/24/2019    Past Surgical History:  Procedure Laterality Date   CERVICAL CERCLAGE N/A 04/22/2019   Procedure: CERCLAGE CERVICAL;  Surgeon: Plainedge Bing, MD;  Location: [facility];  Service: Gynecology;  Laterality: N/A;   TOOTH  EXTRACTION      OB History     Gravida  7   Para  2   Term  1   Preterm  0   AB  4   Living  1      SAB  2   IAB  2   Ectopic  0   Multiple  0   Live Births  1            Home Medications    Prior to Admission medications   Medication Sig Start Date End Date Taking? Authorizing Provider  ibuprofen (ADVIL) 600 MG tablet Take 1 tablet (600 mg total) by mouth every 6 (six) hours as needed. 07/02/23  Yes Naheem Mosco K, PA-C  promethazine-dextromethorphan (PROMETHAZINE-DM) 6.25-15 MG/5ML syrup Take 5 mLs by mouth 3 (three) times daily as needed for cough. 07/02/23  Yes Marque Rademaker K, PA-C  amLODipine (NORVASC) 2.5 MG tablet Take 1 tablet (2.5 mg total) by mouth daily. 10/23/19 01/05/20  FairHoyle Sauer, MD    Family History Family History  Problem Relation Age of Onset   Healthy Mother    Healthy Father    Alcohol abuse Neg Hx    Arthritis Neg Hx    Asthma Neg Hx    Birth defects Neg Hx    Cancer Neg Hx    COPD Neg Hx    Depression Neg Hx    Diabetes Neg Hx    Drug abuse  Neg Hx    Early death Neg Hx    Hearing loss Neg Hx    Heart disease Neg Hx    Hyperlipidemia Neg Hx    Hypertension Neg Hx    Kidney disease Neg Hx    Learning disabilities Neg Hx    Mental illness Neg Hx    Mental retardation Neg Hx    Miscarriages / Stillbirths Neg Hx    Stroke Neg Hx    Vision loss Neg Hx     Social History Social History   Tobacco Use   Smoking status: Former    Current packs/day: 0.00    Average packs/day: 1 pack/day for 5.0 years (5.0 ttl pk-yrs)    Types: Cigars, Cigarettes    Start date: 10/21/2007    Quit date: 10/20/2012    Years since quitting: 10.7   Smokeless tobacco: Never  Vaping Use   Vaping status: Never Used  Substance Use Topics   Alcohol use: No    Comment: socially   Drug use: No    Types: Marijuana    Comment: none per pt 03/24/19     Allergies   Patient has no known allergies.   Review of Systems Review of Systems   Constitutional:  Positive for activity change. Negative for appetite change, fatigue and fever.  HENT:  Positive for congestion, ear pain, sinus pressure and sore throat. Negative for sneezing.   Respiratory:  Positive for cough. Negative for shortness of breath.   Gastrointestinal:  Negative for abdominal pain, diarrhea, nausea and vomiting.  Musculoskeletal:  Positive for arthralgias and myalgias.  Neurological:  Positive for headaches.     Physical Exam Triage Vital Signs ED Triage Vitals [07/02/23 1014]  Encounter Vitals Group     BP 127/88     Systolic BP Percentile      Diastolic BP Percentile      Pulse Rate 69     Resp 16     Temp 98.8 F (37.1 C)     Temp Source Oral     SpO2 98 %     Weight      Height      Head Circumference      Peak Flow      Pain Score 7     Pain Loc      Pain Education      Exclude from Growth Chart    No data found.  Updated Vital Signs BP 127/88 (BP Location: Right Arm)   Pulse 69   Temp 98.8 F (37.1 C) (Oral)   Resp 16   LMP 06/20/2023   SpO2 98%   Visual Acuity Right Eye Distance:   Left Eye Distance:   Bilateral Distance:    Right Eye Near:   Left Eye Near:    Bilateral Near:     Physical Exam Vitals reviewed.  Constitutional:      General: She is awake. She is not in acute distress.    Appearance: Normal appearance. She is well-developed. She is not ill-appearing.     Comments: Very pleasant female appears stated age in no acute distress sitting comfortably in exam room  HENT:     Head: Normocephalic and atraumatic.     Right Ear: Ear canal and external ear normal. Tympanic membrane is retracted. Tympanic membrane is not erythematous or bulging.     Left Ear: Tympanic membrane, ear canal and external ear normal. Tympanic membrane is not erythematous or bulging.     Mouth/Throat:  Pharynx: Uvula midline. Posterior oropharyngeal erythema present. No oropharyngeal exudate.  Cardiovascular:     Rate and Rhythm:  Normal rate and regular rhythm.     Heart sounds: Normal heart sounds, S1 normal and S2 normal. No murmur heard. Pulmonary:     Effort: Pulmonary effort is normal.     Breath sounds: Normal breath sounds. No wheezing, rhonchi or rales.     Comments: Clear to auscultation bilaterally Psychiatric:        Behavior: Behavior is cooperative.      UC Treatments / Results  Labs (all labs ordered are listed, but only abnormal results are displayed) Labs Reviewed  SARS CORONAVIRUS 2 (TAT 6-24 HRS)    EKG   Radiology No results found.  Procedures Procedures (including critical care time)  Medications Ordered in UC Medications - No data to display  Initial Impression / Assessment and Plan / UC Course  I have reviewed the triage vital signs and the nursing notes.  Pertinent labs & imaging results that were available during my care of the patient were reviewed by me and considered in my medical decision making (see chart for details).     Patient is well-appearing, afebrile, nontoxic, nontachycardic.  No evidence of acute infection on physical exam that warrant initiation of antibiotics.  We will test her for COVID and she is to monitor her MyChart for these results.  She is along otherwise healthy so not a candidate for antiviral therapy.  Will treat symptomatically with Promethazine DM for cough.  We discussed this can be sedating and she is not to drive or drink alcohol taking it.  She can use over-the-counter medications including Tylenol, ibuprofen, Mucinex, Flonase.  Recommend nasal saline sinus rinses for additional symptom relief.  Strict return precautions given.  Work excuse note declined.  Final Clinical Impressions(s) / UC Diagnoses   Final diagnoses:  Upper respiratory tract infection, unspecified type  Acute cough     Discharge Instructions      We will contact you if you are positive for COVID.  Use paroxetine DM for cough.  This will make you sleepy so do not  drive or drink alcohol while taking it.  Take ibuprofen 600 mg up to 3-4 times a day.  Do not take additional NSAIDs with this medication due to risk of GI bleeding.  This includes additional ibuprofen, aspirin, naproxen/Aleve.  You can use Tylenol, Mucinex, Flonase for symptom relief.  Make sure that you rest and drink plenty of fluid.  Use nasal saline sinus rinses.  If your symptoms are improving within a week return for reevaluation.  If anything worsens please come back sooner.     ED Prescriptions     Medication Sig Dispense Auth. Provider   promethazine-dextromethorphan (PROMETHAZINE-DM) 6.25-15 MG/5ML syrup Take 5 mLs by mouth 3 (three) times daily as needed for cough. 118 mL Orlan Aversa K, PA-C   ibuprofen (ADVIL) 600 MG tablet Take 1 tablet (600 mg total) by mouth every 6 (six) hours as needed. 30 tablet Louise Victory, Noberto Retort, PA-C      PDMP not reviewed this encounter.   Jeani Hawking, PA-C 07/02/23 1123

## 2023-07-02 NOTE — Discharge Instructions (Signed)
We will contact you if you are positive for COVID.  Use paroxetine DM for cough.  This will make you sleepy so do not drive or drink alcohol while taking it.  Take ibuprofen 600 mg up to 3-4 times a day.  Do not take additional NSAIDs with this medication due to risk of GI bleeding.  This includes additional ibuprofen, aspirin, naproxen/Aleve.  You can use Tylenol, Mucinex, Flonase for symptom relief.  Make sure that you rest and drink plenty of fluid.  Use nasal saline sinus rinses.  If your symptoms are improving within a week return for reevaluation.  If anything worsens please come back sooner.

## 2023-07-02 NOTE — ED Triage Notes (Signed)
Patient presents to [facility] for evaluation of sore throat, cloudy headed, fatigue, coughing, congested, headache, body aches.  Denies known fever.

## 2023-12-15 ENCOUNTER — Emergency Department (HOSPITAL_BASED_OUTPATIENT_CLINIC_OR_DEPARTMENT_OTHER)
Admission: EM | Admit: 2023-12-15 | Discharge: 2023-12-15 | Disposition: A | Discharge status: Home / Self Care | Payer: Medicaid Other | Attending: Emergency Medicine | Admitting: Emergency Medicine

## 2023-12-15 ENCOUNTER — Encounter (HOSPITAL_BASED_OUTPATIENT_CLINIC_OR_DEPARTMENT_OTHER): Payer: Self-pay

## 2023-12-15 ENCOUNTER — Other Ambulatory Visit: Payer: Self-pay

## 2023-12-15 DIAGNOSIS — Z20822 Contact with and (suspected) exposure to covid-19: Secondary | ICD-10-CM | POA: Diagnosis not present

## 2023-12-15 DIAGNOSIS — Z79899 Other long term (current) drug therapy: Secondary | ICD-10-CM | POA: Diagnosis not present

## 2023-12-15 DIAGNOSIS — I10 Essential (primary) hypertension: Secondary | ICD-10-CM | POA: Diagnosis not present

## 2023-12-15 DIAGNOSIS — J069 Acute upper respiratory infection, unspecified: Secondary | ICD-10-CM | POA: Diagnosis not present

## 2023-12-15 DIAGNOSIS — N898 Other specified noninflammatory disorders of vagina: Secondary | ICD-10-CM | POA: Insufficient documentation

## 2023-12-15 LAB — URINALYSIS, ROUTINE W REFLEX MICROSCOPIC
Bilirubin Urine: NEGATIVE
Glucose, UA: NEGATIVE mg/dL
Hgb urine dipstick: NEGATIVE
Ketones, ur: NEGATIVE mg/dL
Nitrite: NEGATIVE
Protein, ur: NEGATIVE mg/dL
Specific Gravity, Urine: 1.025 (ref 1.005–1.030)
pH: 6.5 (ref 5.0–8.0)

## 2023-12-15 LAB — WET PREP, GENITAL
Clue Cells Wet Prep HPF POC: NONE SEEN
Sperm: NONE SEEN
Trich, Wet Prep: NONE SEEN
WBC, Wet Prep HPF POC: <10 (ref <10)
Yeast Wet Prep HPF POC: NONE SEEN

## 2023-12-15 LAB — GROUP A STREP BY PCR: Group A Strep by PCR: NOT DETECTED

## 2023-12-15 LAB — RESP PANEL BY RT-PCR (RSV, FLU A&B, COVID)  RVPGX2
Influenza A by PCR: NEGATIVE
Influenza B by PCR: NEGATIVE
Resp Syncytial Virus by PCR: NEGATIVE
SARS Coronavirus 2 by RT PCR: NEGATIVE

## 2023-12-15 LAB — PREGNANCY, URINE: Preg Test, Ur: NEGATIVE

## 2023-12-15 NOTE — ED Triage Notes (Signed)
Patient arrives with complaints of worsening cough and Flu-like symptoms x3 days. Patient also reports vaginal itching as well.

## 2023-12-15 NOTE — Discharge Instructions (Signed)
You have been seen today for your complaint of upper respiratory infection, vaginal irritation. Your lab work was reassuring.  Your chlamydia and gonorrhea swab will be resulted in 2 to 3 days.  Check your results on your MyChart and seek treatment if positive. Your discharge medications include Claritin and Flonase.  These are over-the-counter medicines used to help with rhinorrhea and congestion.  You may use Delsym, Mucinex DM or Robitussin DM for your cough. Alternate tylenol and ibuprofen for pain and fevers. You may alternate these every 4 hours. You may take up to 800 mg of ibuprofen at a time and up to 1000 mg of tylenol. Home care instructions are as follows:  Drink plenty of water Follow up with: Your primary care provider for reassessment Please seek immediate medical care if you develop any of the following symptoms: You have shortness of breath that gets worse. You have severe or persistent: Headache. Ear pain. Sinus pain. Chest pain. You have chronic lung disease along with any of the following: Making high-pitched whistling sounds when you breathe, most often when you breathe out (wheezing). Prolonged cough (more than 14 days). Coughing up blood. A change in your usual mucus. You have a stiff neck. You have changes in your: Vision. Hearing. Thinking. Mood. At this time there does not appear to be the presence of an emergent medical condition, however there is always the potential for conditions to change. Please read and follow the below instructions.  Do not take your medicine if  develop an itchy rash, swelling in your mouth or lips, or difficulty breathing; call 911 and seek immediate emergency medical attention if this occurs.  You may review your lab tests and imaging results in their entirety on your MyChart account.  Please discuss all results of fully with your primary care provider and other specialist at your follow-up visit.  Note: Portions of this text may  have been transcribed using voice recognition software. Every effort was made to ensure accuracy; however, inadvertent computerized transcription errors may still be present.

## 2023-12-15 NOTE — ED Provider Notes (Signed)
[place] EMERGENCY DEPARTMENT AT [facility] Provider Note   CSN: [id number] Arrival date & time: 12/15/23  4403     History  Chief Complaint  Patient presents with   Cough   Vaginitis    Carrie Hampton is a 33 y.o. female.  With a history of hypertension presenting to the ED for evaluation of an upper respiratory infection as well as vaginal irritation.  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  She reports bilateral otalgia, sore throat, cough, congestion, rhinorrhea, body aches.  She denies any fevers or chills, nausea or vomiting.  She did receive her flu vaccine this year.  She states she was diagnosed with influenza A 2 weeks ago.  Current symptoms began 2 days ago.  She states she was told by her provider at the urgent care that she may have an ear infection.  She is also reports irritation in the vaginal area.  No bleeding or discharge.  She is sexually active. She would like to be tested for STDs. She d she denies dysuria, frequency, urgency, hematuriaenies dysuria, frequency, urgency, hematuria.    Cough Associated symptoms: ear pain, rhinorrhea and sore throat        Home Medications Prior to Admission medications   Medication Sig Start Date End Date Taking? Authorizing Provider  ibuprofen (ADVIL) 600 MG tablet Take 1 tablet (600 mg total) by mouth every 6 (six) hours as needed. 07/02/23   Raspet, Noberto Retort, PA-C  promethazine-dextromethorphan (PROMETHAZINE-DM) 6.25-15 MG/5ML syrup Take 5 mLs by mouth 3 (three) times daily as needed for cough. 07/02/23   Raspet, Noberto Retort, PA-C  amLODipine (NORVASC) 2.5 MG tablet Take 1 tablet (2.5 mg total) by mouth daily. 10/23/19 01/05/20  Joselyn Arrow, MD      Allergies    Patient has no known allergies.    Review of Systems   Review of Systems  HENT:  Positive for congestion, ear pain, rhinorrhea and sore throat.   Respiratory:  Positive for cough.   All other systems reviewed and are  negative.   Physical Exam Updated Vital Signs BP 125/76   Pulse 92   Temp 98.8 F (37.1 C) (Oral)   Resp 18   Ht 5\' 4"  (1.626 m)   Wt 65.8 kg   SpO2 100%   BMI 24.89 kg/m  Physical Exam Vitals and nursing note reviewed. Exam conducted with a chaperone present Merry Proud, Char[employer] fundraiser).  Constitutional:      General: She is not in acute distress.    Appearance: Normal appearance. She is normal weight. She is not ill-appearing.  HENT:     Head: Normocephalic and atraumatic.     Ears:     Comments: Bilateral serous effusions, mild right TM erythema without injection, dullness or bulging Cardiovascular:     Rate and Rhythm: Normal rate and regular rhythm.  Pulmonary:     Effort: Pulmonary effort is normal. No respiratory distress.     Breath sounds: No wheezing, rhonchi or rales.  Abdominal:     General: Abdomen is flat.  Genitourinary:    Comments: Mild amount of white discharge in the vaginal canal.  No pubic rashes or lesions.  No cervical bleeding or petechiae.  No adnexal tenderness bilaterally.  No cervical motion tenderness. Musculoskeletal:        General: Normal range of motion.     Cervical back: Normal range of motion and neck supple. No rigidity.  Lymphadenopathy:  Cervical: No cervical adenopathy.  Skin:    General: Skin is warm and dry.  Neurological:     Mental Status: She is alert and oriented to person, place, and time.  Psychiatric:        Mood and Affect: Mood normal.        Behavior: Behavior normal.     ED Results / Procedures / Treatments   Labs (all labs ordered are listed, but only abnormal results are displayed) Labs Reviewed  URINALYSIS, ROUTINE W REFLEX MICROSCOPIC - Abnormal; Notable for the following components:      Result Value   APPearance HAZY (*)    Leukocytes,Ua SMALL (*)    Bacteria, UA RARE (*)    All other components within normal limits  RESP PANEL BY RT-PCR (RSV, FLU A&B, COVID)  RVPGX2  GROUP A STREP BY PCR  WET PREP, GENITAL   PREGNANCY, URINE  GC/CHLAMYDIA PROBE AMP ([place]) NOT AT [facility]    EKG None  Radiology No results found.  Procedures Procedures    Medications Ordered in ED Medications - No data to display  ED Course/ Medical Decision Making/ A&P                                 Medical Decision Making Amount and/or Complexity of Data Reviewed Labs: ordered.  This patient presents to the ED for concern of URI type symptoms, vaginal irritation, this involves an extensive number of treatment options, and is a complaint that carries with it a high risk of complications and morbidity.  The differential diagnosis includes flu, COVID, RSV, mononucleosis, other viral URI, strep pharyngitis, pneumonia  My initial workup includes labs, pelvic exam  Additional history obtained from: Nursing notes from this visit.  I ordered, reviewed and interpreted labs which include: Respiratory panel, rapid strep, urinalysis, STI panel.  Urine contaminated with 21-50 epithelial cells, does show small leukocytes, 11-20 white blood cells and rare bacteria.  Will defer treatment as she is asymptomatic.  Afebrile, hemodynamically stable.  33 year old female presenting to the ED for evaluation constellation of symptoms consistent with an upper respiratory infection.  Symptoms began 2 days ago.  She works at an urgent care and is exposed to sick contacts daily.  No fevers.  Respiratory panel negative.  Rapid strep negative.  Suspect viral etiology.  She was educated on supportive care and typical timeline.  Pelvic exam reassuring as well.  Wet prep negative.  No indication for empiric treatment at this time.  She was encouraged to monitor her MyChart for chlamydia and gonorrhea results and seek treatment if positive.  She was given return precautions.  Stable at discharge.  At this time there does not appear to be any evidence of an acute emergency medical condition and the patient appears stable for discharge with  appropriate outpatient follow up. Diagnosis was discussed with patient who verbalizes understanding of care plan and is agreeable to discharge. I have discussed return precautions with patient who verbalizes understanding. Patient encouraged to follow-up with their PCP within 1 week. All questions answered.  Note: Portions of this report may have been transcribed using voice recognition software. Every effort was made to ensure accuracy; however, inadvertent computerized transcription errors may still be present.         Final Clinical Impression(s) / ED Diagnoses Final diagnoses:  Viral URI with cough  Vaginal irritation    Rx / DC Orders ED Discharge  Orders     None         Mora Bellman 12/15/23 1237    Margarita Grizzle, MD 12/16/23 [phone]

## 2023-12-17 LAB — GC/CHLAMYDIA PROBE AMP (~~LOC~~) NOT AT ARMC
Chlamydia: NEGATIVE
Comment: NEGATIVE
Comment: NORMAL
Neisseria Gonorrhea: NEGATIVE

## 2024-01-08 ENCOUNTER — Ambulatory Visit (HOSPITAL_COMMUNITY)
Admission: EM | Admit: 2024-01-08 | Discharge: 2024-01-08 | Disposition: A | Discharge status: Home / Self Care | Payer: Medicaid Other | Attending: Physician Assistant | Admitting: Physician Assistant

## 2024-01-08 ENCOUNTER — Encounter (HOSPITAL_COMMUNITY): Payer: Self-pay

## 2024-01-08 DIAGNOSIS — J069 Acute upper respiratory infection, unspecified: Secondary | ICD-10-CM | POA: Diagnosis not present

## 2024-01-08 LAB — POC COVID19/FLU A&B COMBO
Covid Antigen, POC: NEGATIVE
Influenza A Antigen, POC: NEGATIVE
Influenza B Antigen, POC: NEGATIVE

## 2024-01-08 LAB — POCT URINE PREGNANCY: Preg Test, Ur: NEGATIVE

## 2024-01-08 NOTE — Discharge Instructions (Addendum)
Your flu and COVID testing swab was negative.  Your urine pregnancy test was negative.    Based on your described symptoms and the duration of symptoms it is likely that you have a viral upper respiratory infection (often called a "cold")  Symptoms can last for 3-10 days with lingering cough and intermittent symptoms lasting weeks after that.  The goal of treatment at this time is to reduce your symptoms and discomfort   You can use over the counter medications such as Dayquil/Nyquil, AlkaSeltzer formulations, etc to provide further relief of symptoms according to the manufacturer's instructions     If your symptoms do not improve or become worse in the next 5-7 days please make an apt at the office so we can see you  Go to the ER if you begin to have more serious symptoms such as shortness of breath, trouble breathing, loss of consciousness, swelling around the eyes, high fever, severe lasting headaches, vision changes or neck pain/stiffness.

## 2024-01-08 NOTE — ED Triage Notes (Signed)
Headache, body aches, diarrhea, cough onset yesterday. Patient states her boyfriend is sick he is being seen today as well.   Patient tried Mucinex severe cold with little relief.

## 2024-01-08 NOTE — ED Provider Notes (Signed)
[facility]    CSN: [id number] Arrival date & time: 01/08/24  0901      History   Chief Complaint Chief Complaint  Patient presents with   Cough   Diarrhea    HPI Carrie Hampton is a 33 y.o. female.   HPI  She states she has had productive cough, subjective fever, chills for about a day  She states she took Mucinex last night but this is all   She states her boyfriend is with her and has also been sick    Past Medical History:  Diagnosis Date   Hypertension    Premature rupture of membranes 11/03/2017   Patient had PROM at 19 weeks.    STD (female) 2013   Chlamydia    Patient Active Problem List   Diagnosis Date Noted   [redacted] weeks gestation of pregnancy 10/23/2019   Labor and delivery, indication for care 10/20/2019   GBS (group B Streptococcus carrier), +RV culture, currently pregnant 10/10/2019   Marginal insertion of umbilical cord affecting management of mother 09/03/2019   Cervical cerclage suture present 04/22/2019   Transient hypertension 04/22/2019   History of pre-eclampsia in prior pregnancy, currently pregnant 03/28/2019   History of cervical incompetence 03/24/2019    Past Surgical History:  Procedure Laterality Date   CERVICAL CERCLAGE N/A 04/22/2019   Procedure: CERCLAGE CERVICAL;  Surgeon: Kaibito Bing, MD;  Location: [facility] LD [facility]S;  Service: Gynecology;  Laterality: N/A;   TOOTH EXTRACTION      OB History     Gravida  7   Para  2   Term  1   Preterm  0   AB  4   Living  1      SAB  2   IAB  2   Ectopic  0   Multiple  0   Live Births  1            Home Medications    Prior to Admission medications   Medication Sig Start Date End Date Taking? Authorizing Provider  ibuprofen (ADVIL) 600 MG tablet Take 1 tablet (600 mg total) by mouth every 6 (six) hours as needed. 07/02/23   Raspet, Noberto Retort, PA-C  promethazine-dextromethorphan (PROMETHAZINE-DM) 6.25-15 MG/5ML syrup Take 5 mLs by mouth 3 (three) times daily  as needed for cough. 07/02/23   Raspet, Noberto Retort, PA-C  amLODipine (NORVASC) 2.5 MG tablet Take 1 tablet (2.5 mg total) by mouth daily. 10/23/19 01/05/20  Joselyn Arrow, MD    Family History Family History  Problem Relation Age of Onset   Healthy Mother    Healthy Father    Alcohol abuse Neg Hx    Arthritis Neg Hx    Asthma Neg Hx    Birth defects Neg Hx    Cancer Neg Hx    COPD Neg Hx    Depression Neg Hx    Diabetes Neg Hx    Drug abuse Neg Hx    Early death Neg Hx    Hearing loss Neg Hx    Heart disease Neg Hx    Hyperlipidemia Neg Hx    Hypertension Neg Hx    Kidney disease Neg Hx    Learning disabilities Neg Hx    Mental illness Neg Hx    Mental retardation Neg Hx    Miscarriages / Stillbirths Neg Hx    Stroke Neg Hx    Vision loss Neg Hx     Social History Social History  Tobacco Use   Smoking status: Former    Current packs/day: 0.00    Average packs/day: 1 pack/day for 5.0 years (5.0 ttl pk-yrs)    Types: Cigars, Cigarettes    Start date: 10/21/2007    Quit date: 10/20/2012    Years since quitting: 11.2   Smokeless tobacco: Never  Vaping Use   Vaping status: Never Used  Substance Use Topics   Alcohol use: No    Comment: socially   Drug use: No    Types: Marijuana    Comment: none per pt 03/24/19     Allergies   Patient has no known allergies.   Review of Systems Review of Systems  Constitutional:  Positive for chills and fever.  HENT:  Positive for congestion, ear pain and postnasal drip. Negative for sore throat.   Eyes:  Negative for visual disturbance.  Respiratory:  Positive for cough. Negative for shortness of breath and wheezing.   Gastrointestinal:  Positive for diarrhea. Negative for nausea and vomiting.  Musculoskeletal:  Positive for myalgias.  Neurological:  Positive for light-headedness and headaches.     Physical Exam Triage Vital Signs ED Triage Vitals  Encounter Vitals Group     BP 01/08/24 0924 118/83     Systolic BP  Percentile --      Diastolic BP Percentile --      Pulse Rate 01/08/24 0924 71     Resp 01/08/24 0924 16     Temp 01/08/24 0924 98.7 F (37.1 C)     Temp Source 01/08/24 0924 Oral     SpO2 01/08/24 0924 96 %     Weight 01/08/24 0924 148 lb (67.1 kg)     Height 01/08/24 0924 5\' 4"  (1.626 m)     Head Circumference --      Peak Flow --      Pain Score 01/08/24 0923 5     Pain Loc --      Pain Education --      Exclude from Growth Chart --    No data found.  Updated Vital Signs BP 118/83 (BP Location: Left Arm)   Pulse 71   Temp 98.7 F (37.1 C) (Oral)   Resp 16   Ht 5\' 4"  (1.626 m)   Wt 148 lb (67.1 kg)   LMP 11/28/2023 (Approximate)   SpO2 96%   BMI 25.40 kg/m   Visual Acuity Right Eye Distance:   Left Eye Distance:   Bilateral Distance:    Right Eye Near:   Left Eye Near:    Bilateral Near:     Physical Exam Vitals reviewed.  Constitutional:      General: She is awake. She is not in acute distress.    Appearance: Normal appearance. She is well-developed and well-groomed. She is not ill-appearing or toxic-appearing.  HENT:     Head: Normocephalic and atraumatic.     Right Ear: Hearing, tympanic membrane and ear canal normal.     Left Ear: Hearing, tympanic membrane and ear canal normal.     Mouth/Throat:     Lips: Pink.     Mouth: Mucous membranes are moist.     Pharynx: Oropharynx is clear. Uvula midline. No pharyngeal swelling, oropharyngeal exudate, posterior oropharyngeal erythema, uvula swelling or postnasal drip.  Cardiovascular:     Rate and Rhythm: Normal rate and regular rhythm.     Pulses: Normal pulses.          Radial pulses are 2+ on the right side and  2+ on the left side.     Heart sounds: Normal heart sounds. No murmur heard.    No friction rub. No gallop.  Pulmonary:     Effort: Pulmonary effort is normal.     Breath sounds: Normal breath sounds. No decreased air movement. No decreased breath sounds, wheezing, rhonchi or rales.   Musculoskeletal:     Cervical back: Normal range of motion and neck supple.  Lymphadenopathy:     Head:     Right side of head: No submental, submandibular or preauricular adenopathy.     Left side of head: No submental, submandibular or preauricular adenopathy.     Cervical:     Right cervical: No superficial cervical adenopathy.    Left cervical: No superficial cervical adenopathy.     Upper Body:     Right upper body: No supraclavicular adenopathy.     Left upper body: No supraclavicular adenopathy.  Skin:    General: Skin is warm and dry.  Neurological:     General: No focal deficit present.     Mental Status: She is alert and oriented to person, place, and time.  Psychiatric:        Mood and Affect: Mood normal.        Behavior: Behavior normal. Behavior is cooperative.        Thought Content: Thought content normal.        Judgment: Judgment normal.      UC Treatments / Results  Labs (all labs ordered are listed, but only abnormal results are displayed) Labs Reviewed  POC COVID19/FLU A&B COMBO - Normal  POCT URINE PREGNANCY    EKG   Radiology No results found.  Procedures Procedures (including critical care time)  Medications Ordered in UC Medications - No data to display  Initial Impression / Assessment and Plan / UC Course  I have reviewed the triage vital signs and the nursing notes.  Pertinent labs & imaging results that were available during my care of the patient were reviewed by me and considered in my medical decision making (see chart for details).    Urine pregnancy test negative.  Rapid flu and COVID testing negative.  Final Clinical Impressions(s) / UC Diagnoses   Final diagnoses:  Viral upper respiratory tract infection    Acute, new concern Patient presents today with symptoms comprised of headache, body aches, diarrhea, productive cough.  She does report recent sick contacts with similar symptoms.  Physical exam of vital signs are  reassuring today.  At this time I suspect viral URI.Urine pregnancy test negative.  Rapid flu and COVID testing negative.  Recommend symptomatic treatment with over-the-counter medications.  ED and return precautions reviewed and provided in after visit summary.  Follow-up as needed for progressing or persistent symptoms   Discharge Instructions      Your flu and COVID testing swab was negative.  Your urine pregnancy test was negative.    Based on your described symptoms and the duration of symptoms it is likely that you have a viral upper respiratory infection (often called a "cold")  Symptoms can last for 3-10 days with lingering cough and intermittent symptoms lasting weeks after that.  The goal of treatment at this time is to reduce your symptoms and discomfort   You can use over the counter medications such as Dayquil/Nyquil, AlkaSeltzer formulations, etc to provide further relief of symptoms according to the manufacturer's instructions  If preferred you can use Coricidin to manage your symptoms rather than those  medications mentioned above.    If your symptoms do not improve or become worse in the next 5-7 days please make an apt at the office so we can see you  Go to the ER if you begin to have more serious symptoms such as shortness of breath, trouble breathing, loss of consciousness, swelling around the eyes, high fever, severe lasting headaches, vision changes or neck pain/stiffness.       ED Prescriptions   None    PDMP not reviewed this encounter.   Providence Crosby, PA-C 01/08/24 1047

## 2024-01-26 HISTORY — PX: INDUCED ABORTION: SHX677

## 2024-03-07 ENCOUNTER — Encounter (HOSPITAL_BASED_OUTPATIENT_CLINIC_OR_DEPARTMENT_OTHER): Payer: Self-pay | Admitting: Emergency Medicine

## 2024-03-07 ENCOUNTER — Emergency Department (HOSPITAL_BASED_OUTPATIENT_CLINIC_OR_DEPARTMENT_OTHER)
Admission: EM | Admit: 2024-03-07 | Discharge: 2024-03-07 | Disposition: A | Discharge status: Home / Self Care | Attending: Emergency Medicine | Admitting: Emergency Medicine

## 2024-03-07 ENCOUNTER — Other Ambulatory Visit: Payer: Self-pay

## 2024-03-07 DIAGNOSIS — Z79899 Other long term (current) drug therapy: Secondary | ICD-10-CM | POA: Insufficient documentation

## 2024-03-07 DIAGNOSIS — N3000 Acute cystitis without hematuria: Secondary | ICD-10-CM | POA: Diagnosis not present

## 2024-03-07 DIAGNOSIS — N898 Other specified noninflammatory disorders of vagina: Secondary | ICD-10-CM | POA: Diagnosis present

## 2024-03-07 LAB — WET PREP, GENITAL
Clue Cells Wet Prep HPF POC: NONE SEEN
Sperm: NONE SEEN
Trich, Wet Prep: NONE SEEN
WBC, Wet Prep HPF POC: <10 (ref <10)
Yeast Wet Prep HPF POC: NONE SEEN

## 2024-03-07 LAB — URINALYSIS, ROUTINE W REFLEX MICROSCOPIC
Bilirubin Urine: NEGATIVE
Glucose, UA: NEGATIVE mg/dL
Nitrite: NEGATIVE
Protein, ur: 30 mg/dL — AB
Specific Gravity, Urine: 1.029 (ref 1.005–1.030)
pH: 6 (ref 5.0–8.0)

## 2024-03-07 LAB — PREGNANCY, URINE: Preg Test, Ur: NEGATIVE

## 2024-03-07 MED ORDER — SULFAMETHOXAZOLE-TRIMETHOPRIM 800-160 MG PO TABS: 1.0000 | ORAL_TABLET | Freq: Two times a day (BID) | ORAL | 0 refills | Status: AC

## 2024-03-07 NOTE — ED Provider Notes (Signed)
 [place] EMERGENCY DEPARTMENT AT [facility] Provider Note   CSN: [id number] Arrival date & time: 03/07/24  2048     History  Chief Complaint  Patient presents with   Vaginal Discharge    Carrie Hampton is a 33 y.o. female.   Vaginal Discharge Patient presents with vaginal discharge and discomfort.  Requests STD testing.  Did have recent negative HIV testing.  Denies having any risky sexual behavior but states she is worried.  Patient had self swabbed while she was at triage but requests pelvic exam.     Home Medications Prior to Admission medications   Medication Sig Start Date End Date Taking? Authorizing Provider  sulfamethoxazole-trimethoprim (BACTRIM DS) 800-160 MG tablet Take 1 tablet by mouth 2 (two) times daily. 03/07/24  Yes Benjiman Core, MD  ibuprofen (ADVIL) 600 MG tablet Take 1 tablet (600 mg total) by mouth every 6 (six) hours as needed. 07/02/23   Raspet, Noberto Retort, PA-C  promethazine-dextromethorphan (PROMETHAZINE-DM) 6.25-15 MG/5ML syrup Take 5 mLs by mouth 3 (three) times daily as needed for cough. 07/02/23   Raspet, Noberto Retort, PA-C  amLODipine (NORVASC) 2.5 MG tablet Take 1 tablet (2.5 mg total) by mouth daily. 10/23/19 01/05/20  Joselyn Arrow, MD      Allergies    Patient has no known allergies.    Review of Systems   Review of Systems  Genitourinary:  Positive for vaginal discharge.    Physical Exam Updated Vital Signs BP 135/89 (BP Location: Right Arm)   Pulse (!) 101   Temp 98.6 F (37 C) (Oral)   Resp 18   LMP 02/06/2024   SpO2 99%  Physical Exam Vitals and nursing note reviewed.  HENT:     Head: Normocephalic.  Cardiovascular:     Rate and Rhythm: Regular rhythm.  Pulmonary:     Breath sounds: No wheezing.  Abdominal:     Tenderness: There is no abdominal tenderness.  Genitourinary:    Comments: Pelvic exam showed minimal white discharge.  No cervical motion tenderness.  No cervical discharge.  No perineal or vulvar  lesions. Neurological:     Mental Status: She is alert.     ED Results / Procedures / Treatments   Labs (all labs ordered are listed, but only abnormal results are displayed) Labs Reviewed  URINALYSIS, ROUTINE W REFLEX MICROSCOPIC - Abnormal; Notable for the following components:      Result Value   Hgb urine dipstick MODERATE (*)    Ketones, ur TRACE (*)    Protein, ur 30 (*)    Leukocytes,Ua LARGE (*)    Bacteria, UA RARE (*)    All other components within normal limits  WET PREP, GENITAL  PREGNANCY, URINE  RPR  GC/CHLAMYDIA PROBE AMP ([place]) NOT AT [facility]    EKG None  Radiology No results found.  Procedures Procedures    Medications Ordered in ED Medications - No data to display  ED Course/ Medical Decision Making/ A&P                                 Medical Decision Making Amount and/or Complexity of Data Reviewed Labs: ordered.  Risk Prescription drug management.   Patient with complaints about vaginal discharge and discomfort.  Differential diagnosis does include STDs.  She requesting testing for this.  Wet prep reassuring.  Pelvic exam reassuring.  Awaiting urinalysis but should be able to discharge home.  Do not think we need empiric treatment.  Urinalysis showed potential UTI.  Did have some squamous cells but with urinary symptoms we will treat.  Well-appearing.  Will discharge.        Final Clinical Impression(s) / ED Diagnoses Final diagnoses:  Acute cystitis without hematuria    Rx / DC Orders ED Discharge Orders          Ordered    sulfamethoxazole-trimethoprim (BACTRIM DS) 800-160 MG tablet  2 times daily        03/07/24 2319              Benjiman Core, MD 03/07/24 2325

## 2024-03-07 NOTE — ED Triage Notes (Addendum)
 Vaginal discomfort and discharge x 2 days Requesting std/sti testing   Recent neg HIV testing did not want to repeat

## 2024-03-07 NOTE — ED Notes (Addendum)
 Attempted to get blood. 2x

## 2024-03-07 NOTE — Discharge Instructions (Signed)
 STD testing has been done and you will be notified if it comes back positive.

## 2024-03-09 LAB — RPR: RPR Ser Ql: NONREACTIVE

## 2024-03-10 LAB — GC/CHLAMYDIA PROBE AMP (~~LOC~~) NOT AT ARMC
Chlamydia: NEGATIVE
Comment: NEGATIVE
Comment: NORMAL
Neisseria Gonorrhea: NEGATIVE

## 2024-05-03 ENCOUNTER — Other Ambulatory Visit: Payer: Self-pay

## 2024-05-03 ENCOUNTER — Emergency Department (HOSPITAL_COMMUNITY)
Admission: EM | Admit: 2024-05-03 | Discharge: 2024-05-04 | Disposition: A | Discharge status: Home / Self Care | Attending: Emergency Medicine | Admitting: Emergency Medicine

## 2024-05-03 DIAGNOSIS — Z79899 Other long term (current) drug therapy: Secondary | ICD-10-CM | POA: Insufficient documentation

## 2024-05-03 DIAGNOSIS — E876 Hypokalemia: Secondary | ICD-10-CM | POA: Diagnosis not present

## 2024-05-03 DIAGNOSIS — O219 Vomiting of pregnancy, unspecified: Secondary | ICD-10-CM | POA: Diagnosis present

## 2024-05-03 DIAGNOSIS — I10 Essential (primary) hypertension: Secondary | ICD-10-CM | POA: Diagnosis not present

## 2024-05-03 DIAGNOSIS — R197 Diarrhea, unspecified: Secondary | ICD-10-CM | POA: Diagnosis not present

## 2024-05-03 DIAGNOSIS — E860 Dehydration: Secondary | ICD-10-CM | POA: Diagnosis not present

## 2024-05-03 DIAGNOSIS — R112 Nausea with vomiting, unspecified: Secondary | ICD-10-CM

## 2024-05-03 DIAGNOSIS — Z3490 Encounter for supervision of normal pregnancy, unspecified, unspecified trimester: Secondary | ICD-10-CM

## 2024-05-03 LAB — URINALYSIS, ROUTINE W REFLEX MICROSCOPIC
Bilirubin Urine: NEGATIVE
Glucose, UA: NEGATIVE mg/dL
Hgb urine dipstick: NEGATIVE
Ketones, ur: 80 mg/dL — AB
Nitrite: NEGATIVE
Protein, ur: 100 mg/dL — AB
Specific Gravity, Urine: 1.027 (ref 1.005–1.030)
pH: 6 (ref 5.0–8.0)

## 2024-05-03 LAB — COMPREHENSIVE METABOLIC PANEL WITH GFR
ALT: 18 U/L (ref 0–44)
AST: 21 U/L (ref 15–41)
Albumin: 4.4 g/dL (ref 3.5–5.0)
Alkaline Phosphatase: 64 U/L (ref 38–126)
Anion gap: 13 (ref 5–15)
BUN: 7 mg/dL (ref 6–20)
CO2: 23 mmol/L (ref 22–32)
Calcium: 9.4 mg/dL (ref 8.9–10.3)
Chloride: 98 mmol/L (ref 98–111)
Creatinine, Ser: 0.66 mg/dL (ref 0.44–1.00)
GFR, Estimated: >60 mL/min (ref >60)
Glucose, Bld: 76 mg/dL (ref 70–99)
Potassium: 3.1 mmol/L — ABNORMAL LOW (ref 3.5–5.1)
Sodium: 134 mmol/L — ABNORMAL LOW (ref 135–145)
Total Bilirubin: 1.2 mg/dL (ref 0.0–1.2)
Total Protein: 8.3 g/dL — ABNORMAL HIGH (ref 6.5–8.1)

## 2024-05-03 LAB — LIPASE, BLOOD: Lipase: 32 U/L (ref 11–51)

## 2024-05-03 LAB — CBC
HCT: 46.5 % — ABNORMAL HIGH (ref 36.0–46.0)
Hemoglobin: 16.1 g/dL — ABNORMAL HIGH (ref 12.0–15.0)
MCH: 29.3 pg (ref 26.0–34.0)
MCHC: 34.6 g/dL (ref 30.0–36.0)
MCV: 84.7 fL (ref 80.0–100.0)
Platelets: 233 10*3/uL (ref 150–400)
RBC: 5.49 MIL/uL — ABNORMAL HIGH (ref 3.87–5.11)
RDW: 11.3 % — ABNORMAL LOW (ref 11.5–15.5)
WBC: 7.1 10*3/uL (ref 4.0–10.5)
nRBC: 0 % (ref 0.0–0.2)

## 2024-05-03 LAB — HCG, SERUM, QUALITATIVE: Preg, Serum: POSITIVE — AB

## 2024-05-03 MED ORDER — LACTATED RINGERS IV BOLUS
1000.0000 mL | Freq: Once | INTRAVENOUS | Status: AC
  Administered 2024-05-03: 1000 mL via INTRAVENOUS

## 2024-05-03 MED ORDER — ONDANSETRON HCL 4 MG/2ML IJ SOLN
4.0000 mg | Freq: Once | INTRAMUSCULAR | Status: AC
  Administered 2024-05-03: 4 mg via INTRAVENOUS
  Filled 2024-05-03: qty 2

## 2024-05-03 MED ORDER — ONDANSETRON 4 MG PO TBDP
4.0000 mg | ORAL_TABLET | Freq: Once | ORAL | Status: AC | PRN
  Administered 2024-05-03: 4 mg via ORAL
  Filled 2024-05-03: qty 1

## 2024-05-03 MED ORDER — POTASSIUM CHLORIDE CRYS ER 20 MEQ PO TBCR
40.0000 meq | EXTENDED_RELEASE_TABLET | Freq: Once | ORAL | Status: AC
  Administered 2024-05-03: 40 meq via ORAL
  Filled 2024-05-03: qty 2

## 2024-05-03 NOTE — ED Provider Notes (Signed)
 [place] EMERGENCY DEPARTMENT AT [facility] Provider Note   CSN: [id number] Arrival date & time: 05/03/24  1747     History {Add pertinent medical, surgical, social history, OB history to HPI:1} Chief Complaint  Patient presents with   Emesis    Pt arrives today with c/o weakness, dizziness, flu-like symptoms that began about a week ago. States she works at [employer], and her home has been sick this past week. No cp/sob, abd pain    Carrie Hampton is a 33 y.o. female.  The history is provided by the patient and medical records.  Emesis Associated symptoms: diarrhea    33 year old female with history of hypertension, presenting to the ED with nausea, vomiting, diarrhea, fatigue, and generally feeling unwell.  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  States over the week whole family has started to get sick with same but they have since improved.  She continues just feeling poorly and has not been able to eat/drink.  No fever/chills.  States she feels dehydrated and very fatigued.  LMP end of April.  Home Medications Prior to Admission medications   Medication Sig Start Date End Date Taking? Authorizing Provider  ibuprofen  (ADVIL ) 600 MG tablet Take 1 tablet (600 mg total) by mouth every 6 (six) hours as needed. 07/02/23   Raspet, Betsey Brow, PA-C  promethazine -dextromethorphan (PROMETHAZINE -DM) 6.25-15 MG/5ML syrup Take 5 mLs by mouth 3 (three) times daily as needed for cough. 07/02/23   Raspet, Erin K, PA-C  sulfamethoxazole -trimethoprim  (BACTRIM  DS) 800-160 MG tablet Take 1 tablet by mouth 2 (two) times daily. 03/07/24   Mozell Arias, MD  amLODipine  (NORVASC ) 2.5 MG tablet Take 1 tablet (2.5 mg total) by mouth daily. 10/23/19 01/05/20  Daina Drum, MD      Allergies    Patient has no known allergies.    Review of Systems   Review of Systems  Constitutional:  Positive for fatigue.  Gastrointestinal:  Positive for diarrhea, nausea and vomiting.  All other  systems reviewed and are negative.   Physical Exam Updated Vital Signs BP 120/75   Pulse 75   Temp 98.2 F (36.8 C)   Resp 18   Ht 5\' 4"  (1.626 m)   Wt 65.8 kg   SpO2 100%   BMI 24.89 kg/m  Physical Exam Vitals and nursing note reviewed.  Constitutional:      Appearance: She is well-developed.  HENT:     Head: Normocephalic and atraumatic.  Eyes:     Conjunctiva/sclera: Conjunctivae normal.     Pupils: Pupils are equal, round, and reactive to light.  Cardiovascular:     Rate and Rhythm: Normal rate and regular rhythm.     Heart sounds: Normal heart sounds.  Pulmonary:     Effort: Pulmonary effort is normal. No respiratory distress.     Breath sounds: Normal breath sounds. No rhonchi.  Abdominal:     General: Bowel sounds are normal.     Palpations: Abdomen is soft.     Tenderness: There is no abdominal tenderness. There is no rebound.  Musculoskeletal:        General: Normal range of motion.     Cervical back: Normal range of motion.  Skin:    General: Skin is warm and dry.  Neurological:     Mental Status: She is alert and oriented to person, place, and time.     ED Results / Procedures / Treatments   Labs (all labs ordered are  listed, but only abnormal results are displayed) Labs Reviewed  COMPREHENSIVE METABOLIC PANEL WITH GFR - Abnormal; Notable for the following components:      Result Value   Sodium 134 (*)    Potassium 3.1 (*)    Total Protein 8.3 (*)    All other components within normal limits  CBC - Abnormal; Notable for the following components:   RBC 5.49 (*)    Hemoglobin 16.1 (*)    HCT 46.5 (*)    RDW 11.3 (*)    All other components within normal limits  URINALYSIS, ROUTINE W REFLEX MICROSCOPIC - Abnormal; Notable for the following components:   APPearance CLOUDY (*)    Ketones, ur 80 (*)    Protein, ur 100 (*)    Leukocytes,Ua LARGE (*)    Bacteria, UA RARE (*)    All other components within normal limits  HCG, SERUM, QUALITATIVE -  Abnormal; Notable for the following components:   Preg, Serum POSITIVE (*)    All other components within normal limits  LIPASE, BLOOD    EKG None  Radiology No results found.  Procedures Procedures  {Document cardiac monitor, telemetry assessment procedure when appropriate:1}  Medications Ordered in ED Medications  lactated ringers  bolus 1,000 mL (has no administration in time range)  potassium chloride  SA (KLOR-CON  M) CR tablet 40 mEq (has no administration in time range)  ondansetron  (ZOFRAN ) injection 4 mg (has no administration in time range)  ondansetron  (ZOFRAN -ODT) disintegrating tablet 4 mg (4 mg Oral Given 05/03/24 1823)    ED Course/ Medical Decision Making/ A&P   {   Click here for ABCD2, HEART and other calculatorsREFRESH Note before signing :1}                              Medical Decision Making Amount and/or Complexity of Data Reviewed Labs: ordered. ECG/medicine tests: ordered and independent interpretation performed.  Risk Prescription drug management.   33 y.o. F here with N/V/D and fatigue.  Family sick with similar over the past week but have improved since.  States she feels poor overall.    Afebrile, non-toxic in appearnace here.  Does seem fatigued.  Labs as above--no leukocytosis.  Stable hemoglobin.  Potassium is low at 3.1.  UA with ketones consistent with some dehydration.  Her pregnancy test is positive.  I suspect this is likely contributing to her symptoms.  May have concurrent viral syndrome given family has been sick as well.  Not currently having vaginal bleeding or abdominal pain.    Plan for IV fluids, Zofran , and potassium repletion here, will reassess.  Final Clinical Impression(s) / ED Diagnoses Final diagnoses:  None    Rx / DC Orders ED Discharge Orders     None

## 2024-05-04 NOTE — Discharge Instructions (Signed)
 Follow-up with OB-GYN for ongoing prenatal care Make sure to rest and hydrate.  Can return here for any new/acute changes.

## 2024-08-18 ENCOUNTER — Encounter (HOSPITAL_COMMUNITY): Payer: Self-pay | Admitting: Emergency Medicine

## 2024-08-18 ENCOUNTER — Ambulatory Visit (HOSPITAL_COMMUNITY)
Admission: EM | Admit: 2024-08-18 | Discharge: 2024-08-18 | Disposition: A | Discharge status: Home / Self Care | Attending: Physician Assistant | Admitting: Physician Assistant

## 2024-08-18 DIAGNOSIS — N760 Acute vaginitis: Secondary | ICD-10-CM | POA: Insufficient documentation

## 2024-08-18 DIAGNOSIS — B9689 Other specified bacterial agents as the cause of diseases classified elsewhere: Secondary | ICD-10-CM | POA: Diagnosis present

## 2024-08-18 MED ORDER — METRONIDAZOLE 500 MG PO TABS: 500.0000 mg | ORAL_TABLET | Freq: Two times a day (BID) | ORAL | 0 refills | Status: AC

## 2024-08-18 NOTE — Discharge Instructions (Addendum)
 Take Flagyl  as prescribed. Will call with test results and change treatment plan if indicated.

## 2024-08-18 NOTE — ED Triage Notes (Signed)
 Pt reports having vaginal irritation after shaving. Pt reports having an odor. Denies discharge. Requesting STD testing. Denies any known exposure.

## 2024-08-18 NOTE — ED Provider Notes (Signed)
 [facility]    CSN: [id number] Arrival date & time: 08/18/24  1545      History   Chief Complaint Chief Complaint  Patient presents with   SEXUALLY TRANSMITTED DISEASE    HPI Carrie Hampton is a 33 y.o. female.   Patient presents with increased vaginal discharge and foul odor that started a few days ago.  She reports thin vaginal discharge.  She is also requesting STD testing.  She denies any known exposures at this time.  Denies urinary symptoms, abdominal pain, fever, chills.    Past Medical History:  Diagnosis Date   Hypertension    Premature rupture of membranes 11/03/2017   Patient had PROM at 19 weeks.    STD (female) 2013   Chlamydia    Patient Active Problem List   Diagnosis Date Noted   [redacted] weeks gestation of pregnancy 10/23/2019   Labor and delivery, indication for care 10/20/2019   GBS (group B Streptococcus carrier), +RV culture, currently pregnant 10/10/2019   Marginal insertion of umbilical cord affecting management of mother 09/03/2019   Cervical cerclage suture present 04/22/2019   Transient hypertension 04/22/2019   History of pre-eclampsia in prior pregnancy, currently pregnant 03/28/2019   History of cervical incompetence 03/24/2019    Past Surgical History:  Procedure Laterality Date   CERVICAL CERCLAGE N/A 04/22/2019   Procedure: CERCLAGE CERVICAL;  Surgeon: Izell Harari, MD;  Location: [facility];  Service: Gynecology;  Laterality: N/A;   INDUCED ABORTION  01/2024   TOOTH EXTRACTION      OB History     Gravida  7   Para  2   Term  1   Preterm  0   AB  4   Living  1      SAB  2   IAB  2   Ectopic  0   Multiple  0   Live Births  1            Home Medications    Prior to Admission medications   Medication Sig Start Date End Date Taking? Authorizing Provider  metroNIDAZOLE  (FLAGYL ) 500 MG tablet Take 1 tablet (500 mg total) by mouth 2 (two) times daily. 08/18/24  Yes Ward, Harlene PEDLAR, PA-C  ibuprofen   (ADVIL ) 600 MG tablet Take 1 tablet (600 mg total) by mouth every 6 (six) hours as needed. 07/02/23   Raspet, Erin K, PA-C  sulfamethoxazole -trimethoprim  (BACTRIM  DS) 800-160 MG tablet Take 1 tablet by mouth 2 (two) times daily. 03/07/24   Patsey Lot, MD  amLODipine  (NORVASC ) 2.5 MG tablet Take 1 tablet (2.5 mg total) by mouth daily. 10/23/19 01/05/20  Dotti Mitzie SAILOR, MD    Family History Family History  Problem Relation Age of Onset   Healthy Mother    Healthy Father    Alcohol abuse Neg Hx    Arthritis Neg Hx    Asthma Neg Hx    Birth defects Neg Hx    Cancer Neg Hx    COPD Neg Hx    Depression Neg Hx    Diabetes Neg Hx    Drug abuse Neg Hx    Early death Neg Hx    Hearing loss Neg Hx    Heart disease Neg Hx    Hyperlipidemia Neg Hx    Hypertension Neg Hx    Kidney disease Neg Hx    Learning disabilities Neg Hx    Mental illness Neg Hx    Mental retardation Neg Hx  Miscarriages / Stillbirths Neg Hx    Stroke Neg Hx    Vision loss Neg Hx     Social History Social History   Tobacco Use   Smoking status: Former    Current packs/day: 0.00    Average packs/day: 1 pack/day for 5.0 years (5.0 ttl pk-yrs)    Types: Cigars, Cigarettes    Start date: 10/21/2007    Quit date: 10/20/2012    Years since quitting: 11.8   Smokeless tobacco: Never  Vaping Use   Vaping status: Never Used  Substance Use Topics   Alcohol use: No    Comment: socially   Drug use: No    Types: Marijuana    Comment: none per pt 03/24/19     Allergies   Patient has no known allergies.   Review of Systems Review of Systems  Constitutional:  Negative for chills and fever.  HENT:  Negative for ear pain and sore throat.   Eyes:  Negative for pain and visual disturbance.  Respiratory:  Negative for cough and shortness of breath.   Cardiovascular:  Negative for chest pain and palpitations.  Gastrointestinal:  Negative for abdominal pain and vomiting.  Genitourinary:  Positive for vaginal  discharge. Negative for dysuria and hematuria.  Musculoskeletal:  Negative for arthralgias and back pain.  Skin:  Negative for color change and rash.  Neurological:  Negative for seizures and syncope.  All other systems reviewed and are negative.    Physical Exam Triage Vital Signs ED Triage Vitals  Encounter Vitals Group     BP 08/18/24 1649 137/82     Girls Systolic BP Percentile --      Girls Diastolic BP Percentile --      Boys Systolic BP Percentile --      Boys Diastolic BP Percentile --      Pulse Rate 08/18/24 1649 73     Resp 08/18/24 1649 16     Temp 08/18/24 1649 97.9 F (36.6 C)     Temp Source 08/18/24 1649 Oral     SpO2 08/18/24 1649 96 %     Weight --      Height --      Head Circumference --      Peak Flow --      Pain Score 08/18/24 1648 0     Pain Loc --      Pain Education --      Exclude from Growth Chart --    No data found.  Updated Vital Signs BP 137/82 (BP Location: Right Arm)   Pulse 73   Temp 97.9 F (36.6 C) (Oral)   Resp 16   LMP 08/01/2024 (Exact Date)   SpO2 96%   Visual Acuity Right Eye Distance:   Left Eye Distance:   Bilateral Distance:    Right Eye Near:   Left Eye Near:    Bilateral Near:     Physical Exam Vitals and nursing note reviewed.  Constitutional:      General: She is not in acute distress.    Appearance: She is well-developed.  HENT:     Head: Normocephalic and atraumatic.  Eyes:     Conjunctiva/sclera: Conjunctivae normal.  Cardiovascular:     Rate and Rhythm: Normal rate and regular rhythm.     Heart sounds: No murmur heard. Pulmonary:     Effort: Pulmonary effort is normal. No respiratory distress.     Breath sounds: Normal breath sounds.  Abdominal:     Palpations: Abdomen  is soft.     Tenderness: There is no abdominal tenderness.  Musculoskeletal:        General: No swelling.     Cervical back: Neck supple.  Skin:    General: Skin is warm and dry.     Capillary Refill: Capillary refill takes  less than 2 seconds.  Neurological:     Mental Status: She is alert.  Psychiatric:        Mood and Affect: Mood normal.      UC Treatments / Results  Labs (all labs ordered are listed, but only abnormal results are displayed) Labs Reviewed  CERVICOVAGINAL ANCILLARY ONLY    EKG   Radiology No results found.  Procedures Procedures (including critical care time)  Medications Ordered in UC Medications - No data to display  Initial Impression / Assessment and Plan / UC Course  I have reviewed the triage vital signs and the nursing notes.  Pertinent labs & imaging results that were available during my care of the patient were reviewed by me and considered in my medical decision making (see chart for details).     Will treat for bacterial vaginosis.  Metronidazole  prescribed.  Cervicovaginal self swab in clinic today, pending results.  Will change treatment plan if indicated based on results.  Patient declines HIV and RPR testing today. Final Clinical Impressions(s) / UC Diagnoses   Final diagnoses:  Bacterial vaginitis     Discharge Instructions      Take Flagyl  as prescribed. Will call with test results and change treatment plan if indicated.   ED Prescriptions     Medication Sig Dispense Auth. Provider   metroNIDAZOLE  (FLAGYL ) 500 MG tablet Take 1 tablet (500 mg total) by mouth 2 (two) times daily. 14 tablet Ward, Militza Devery Z, PA-C      PDMP not reviewed this encounter.   Ward, Harlene PEDLAR, PA-C 08/18/24 [phone]

## 2024-08-19 ENCOUNTER — Ambulatory Visit (HOSPITAL_COMMUNITY): Payer: Self-pay

## 2024-08-19 LAB — CERVICOVAGINAL ANCILLARY ONLY
Bacterial Vaginitis (gardnerella): POSITIVE — AB
Candida Glabrata: NEGATIVE
Candida Vaginitis: POSITIVE — AB
Chlamydia: NEGATIVE
Comment: NEGATIVE
Comment: NEGATIVE
Comment: NEGATIVE
Comment: NEGATIVE
Comment: NEGATIVE
Comment: NORMAL
Neisseria Gonorrhea: NEGATIVE
Trichomonas: NEGATIVE

## 2024-08-19 MED ORDER — FLUCONAZOLE 150 MG PO TABS: 150.0000 mg | ORAL_TABLET | Freq: Once | ORAL | 0 refills | Status: AC

## 2024-10-26 ENCOUNTER — Emergency Department (HOSPITAL_COMMUNITY)
Admission: EM | Admit: 2024-10-26 | Discharge: 2024-10-26 | Discharge status: Against Medical Advice | Attending: Emergency Medicine | Admitting: Emergency Medicine

## 2024-10-26 ENCOUNTER — Emergency Department (HOSPITAL_COMMUNITY)

## 2024-10-26 ENCOUNTER — Telehealth (HOSPITAL_COMMUNITY): Payer: Self-pay | Admitting: Emergency Medicine

## 2024-10-26 ENCOUNTER — Encounter (HOSPITAL_COMMUNITY): Payer: Self-pay

## 2024-10-26 DIAGNOSIS — Z5321 Procedure and treatment not carried out due to patient leaving prior to being seen by health care provider: Secondary | ICD-10-CM | POA: Diagnosis not present

## 2024-10-26 DIAGNOSIS — R109 Unspecified abdominal pain: Secondary | ICD-10-CM | POA: Diagnosis present

## 2024-10-26 DIAGNOSIS — N939 Abnormal uterine and vaginal bleeding, unspecified: Secondary | ICD-10-CM

## 2024-10-26 LAB — CBC WITH DIFFERENTIAL/PLATELET
Abs Immature Granulocytes: 0.02 K/uL (ref 0.00–0.07)
Basophils Absolute: 0 K/uL (ref 0.0–0.1)
Basophils Relative: 1 %
Eosinophils Absolute: 0.2 K/uL (ref 0.0–0.5)
Eosinophils Relative: 3 %
HCT: 45.8 % (ref 36.0–46.0)
Hemoglobin: 14.7 g/dL (ref 12.0–15.0)
Immature Granulocytes: 0 %
Lymphocytes Relative: 29 %
Lymphs Abs: 1.7 K/uL (ref 0.7–4.0)
MCH: 28.5 pg (ref 26.0–34.0)
MCHC: 32.1 g/dL (ref 30.0–36.0)
MCV: 88.8 fL (ref 80.0–100.0)
Monocytes Absolute: 0.2 K/uL (ref 0.1–1.0)
Monocytes Relative: 4 %
Neutro Abs: 3.5 K/uL (ref 1.7–7.7)
Neutrophils Relative %: 63 %
Platelets: 222 K/uL (ref 150–400)
RBC: 5.16 MIL/uL — ABNORMAL HIGH (ref 3.87–5.11)
RDW: 12.3 % (ref 11.5–15.5)
WBC: 5.7 K/uL (ref 4.0–10.5)
nRBC: 0 % (ref 0.0–0.2)

## 2024-10-26 LAB — COMPREHENSIVE METABOLIC PANEL WITH GFR
ALT: 16 U/L (ref 0–44)
AST: 21 U/L (ref 15–41)
Albumin: 4.3 g/dL (ref 3.5–5.0)
Alkaline Phosphatase: 68 U/L (ref 38–126)
Anion gap: 13 (ref 5–15)
BUN: 7 mg/dL (ref 6–20)
CO2: 21 mmol/L — ABNORMAL LOW (ref 22–32)
Calcium: 9.2 mg/dL (ref 8.9–10.3)
Chloride: 107 mmol/L (ref 98–111)
Creatinine, Ser: 0.69 mg/dL (ref 0.44–1.00)
GFR, Estimated: >60 mL/min (ref >60)
Glucose, Bld: 74 mg/dL (ref 70–99)
Potassium: 3.6 mmol/L (ref 3.5–5.1)
Sodium: 141 mmol/L (ref 135–145)
Total Bilirubin: 0.2 mg/dL (ref 0.0–1.2)
Total Protein: 7.4 g/dL (ref 6.5–8.1)

## 2024-10-26 LAB — WET PREP, GENITAL
Clue Cells Wet Prep HPF POC: NONE SEEN
Sperm: NONE SEEN
Trich, Wet Prep: NONE SEEN
WBC, Wet Prep HPF POC: <10 (ref <10)
Yeast Wet Prep HPF POC: NONE SEEN

## 2024-10-26 LAB — URINALYSIS, ROUTINE W REFLEX MICROSCOPIC
Bacteria, UA: NONE SEEN
Bilirubin Urine: NEGATIVE
Glucose, UA: NEGATIVE mg/dL
Ketones, ur: NEGATIVE mg/dL
Leukocytes,Ua: NEGATIVE
Nitrite: NEGATIVE
Protein, ur: 30 mg/dL — AB
RBC / HPF: >50 RBC/hpf (ref 0–5)
Specific Gravity, Urine: 1.026 (ref 1.005–1.030)
pH: 5 (ref 5.0–8.0)

## 2024-10-26 LAB — HCG, QUANTITATIVE, PREGNANCY: hCG, Beta Chain, Quant, S: 1201 m[IU]/mL — ABNORMAL HIGH (ref <5)

## 2024-10-26 MED ORDER — LACTATED RINGERS IV BOLUS: 500.0000 mL | Freq: Once | INTRAVENOUS | Status: DC

## 2024-10-26 MED ORDER — ONDANSETRON HCL 4 MG/2ML IJ SOLN
4.0000 mg | Freq: Once | INTRAMUSCULAR | Status: AC
  Administered 2024-10-26: 4 mg via INTRAVENOUS
  Filled 2024-10-26: qty 2

## 2024-10-26 MED ORDER — ACETAMINOPHEN 500 MG PO TABS
1000.0000 mg | ORAL_TABLET | Freq: Once | ORAL | Status: AC
  Administered 2024-10-26: 1000 mg via ORAL
  Filled 2024-10-26: qty 2

## 2024-10-26 MED ORDER — HYDROMORPHONE HCL 1 MG/ML IJ SOLN
0.5000 mg | Freq: Once | INTRAMUSCULAR | Status: DC
  Filled 2024-10-26: qty 1

## 2024-10-26 NOTE — ED Notes (Signed)
 Per PA pt should have been put in as eloped vs AMA because they left before provider could see her with results. Unable to change at this time.

## 2024-10-26 NOTE — ED Provider Notes (Signed)
 [place] EMERGENCY DEPARTMENT AT [facility] Provider Note   CSN: [id number] Arrival date & time: 10/26/24  9061     Patient presents with: Abdominal Pain   Carrie Hampton is a 33 y.o. female, [id number], with a history of recent surgical abortion presents to the ED with abdominal pain that began last Monday. The symptoms started after her abortion procedure last Monday and have been progressively worsening.  The patient stated that she has been bleeding since Monday, however bleeding has lessened.  Patient states that she was passing palm-sized blood clots until Wednesday, the bleeding subsided Thursday, and now it feels like a normal cycle.  Patient states that she has had abortion procedure for her and has not experienced pain like this with the procedure.  Patient states that she believes she was around [redacted] weeks pregnant. Patient endorses pelvic pain that does not radiate, however stays constant.  Patient has been taking ibuprofen  800 without relief.  {Add pertinent medical, surgical, social history, OB history to HPI:32947}  Abdominal Pain      Prior to Admission medications   Medication Sig Start Date End Date Taking? Authorizing Provider  ibuprofen  (ADVIL ) 600 MG tablet Take 1 tablet (600 mg total) by mouth every 6 (six) hours as needed. 07/02/23   Raspet, Erin K, PA-C  metroNIDAZOLE  (FLAGYL ) 500 MG tablet Take 1 tablet (500 mg total) by mouth 2 (two) times daily. 08/18/24   Ward, Jessica Z, PA-C  sulfamethoxazole -trimethoprim  (BACTRIM  DS) 800-160 MG tablet Take 1 tablet by mouth 2 (two) times daily. 03/07/24   Patsey Lot, MD  amLODipine  (NORVASC ) 2.5 MG tablet Take 1 tablet (2.5 mg total) by mouth daily. 10/23/19 01/05/20  Dotti Mitzie SAILOR, MD    Allergies: Patient has no known allergies.    Review of Systems  Gastrointestinal:  Positive for abdominal pain.    Updated Vital Signs BP 120/77 (BP Location: Left Arm)   Pulse 68   Temp 97.7 F (36.5 C) (Oral)    Resp 16   Ht 5' 4 (1.626 m)   Wt 65.8 kg   SpO2 100%   BMI 24.90 kg/m   Physical Exam Vitals and nursing note reviewed.  Constitutional:      General: She is not in acute distress.    Appearance: Normal appearance.  HENT:     Head: Normocephalic and atraumatic.  Eyes:     Extraocular Movements: Extraocular movements intact.     Conjunctiva/sclera: Conjunctivae normal.     Pupils: Pupils are equal, round, and reactive to light.  Cardiovascular:     Rate and Rhythm: Normal rate and regular rhythm.     Pulses: Normal pulses.  Pulmonary:     Effort: Pulmonary effort is normal. No respiratory distress.  Abdominal:     General: Abdomen is flat.     Palpations: Abdomen is soft.     Tenderness: There is no abdominal tenderness.     Comments: Tenderness to palpation.  Mild distention.  No CVA tenderness, guarding, rebound.  Musculoskeletal:        General: Normal range of motion.     Cervical back: Normal range of motion.  Skin:    General: Skin is warm and dry.     Capillary Refill: Capillary refill takes less than 2 seconds.  Neurological:     General: No focal deficit present.     Mental Status: She is alert. Mental status is at baseline.  Psychiatric:        Mood  and Affect: Mood normal.     (all labs ordered are listed, but only abnormal results are displayed) Labs Reviewed - No data to display  EKG: None  Radiology: No results found.  {Document cardiac monitor, telemetry assessment procedure when appropriate:32947} Procedures   Medications Ordered in the ED - No data to display  Clinical Course as of 10/26/24 1523  Sun Oct 26, 2024  1004 Temp: 97.7 F (36.5 C) Patient is afebrile.  Vital stable. [ML]  1210 Pelvic exam complete - There is blood in the vaginal vault.No retained products of conception or visible tissue noted.  Findings overall consistent with normal postabortion pelvic exam. [ML]  1333 Urinalysis, Routine w reflex microscopic -Urine, Clean  Catch(!) Urinalysis unremarkable [ML]  1333 Comprehensive metabolic panel(!) No acute findings [ML]  1333 CBC with Differential/Platelet(!) No acute findings  [ML]  1334 hCG, quantitative, pregnancy(!) hCG still remains elevated - 1 week post-surgical abortion.  [ML]  1335 RN notified that patient eloped. [ML]  1336 Pain managed managed with Tylenol  1000 mg, nausea managed with Zofran  4 mg.  Patient denied LR bolus due to not wanting to wait for it to finish.   [ML]  1337 HIV Antibody (routine testing w rflx) Pending [ML]  1337 GC/Chlamydia probe amp ([place]) not at [facility] Pending [ML]    Clinical Course User Index [ML] Willma Duwaine CROME, PA   {Click here for ABCD2, HEART and other calculators REFRESH Note before signing:1}                              Medical Decision Making Amount and/or Complexity of Data Reviewed Labs: ordered. Decision-making details documented in ED Course. Radiology: ordered.  Risk OTC drugs. Prescription drug management.   Patient presents to the ED for: Abdominal pain after surgical abortion This involves an extensive number of treatment options and is a complaint that carries with it a high risk of complications  Differential diagnosis includes: *** Co-morbid conditions:  Additional history/records obtained and reviewed: Additional history obtained from  outside medical records External records from outside source obtained and reviewed including   Clinical Course as of 10/26/24 1523  Sun Oct 26, 2024  1004 Temp: 97.7 F (36.5 C) Patient is afebrile.  Vital stable. [ML]  1210 Pelvic exam complete - There is blood in the vaginal vault.No retained products of conception or visible tissue noted.  Findings overall consistent with normal postabortion pelvic exam. [ML]  1333 Urinalysis, Routine w reflex microscopic -Urine, Clean Catch(!) Urinalysis unremarkable [ML]  1333 Comprehensive metabolic panel(!) No acute findings [ML]  1333 CBC with  Differential/Platelet(!) No acute findings  [ML]  1334 hCG, quantitative, pregnancy(!) hCG still remains elevated - 1 week post-surgical abortion.  [ML]  1335 RN notified that patient eloped. [ML]  1336 Pain managed managed with Tylenol  1000 mg, nausea managed with Zofran  4 mg.  Patient denied LR bolus due to not wanting to wait for it to finish.   [ML]  1337 HIV Antibody (routine testing w rflx) Pending [ML]  1337 GC/Chlamydia probe amp ([place]) not at [facility] Pending [ML]    Clinical Course User Index [ML] Willma Duwaine CROME, [place]    Data Reviewed / Actions Taken: Labs ordered/reviewed with my independent interpretation in ED course above. Imaging ordered/reviewed with my independent interpretation in ED course above. I agree with the radiologists interpretation.  The patient kept on continuous cardiac monitoring during the ED stay. Key findings  for the patient were reviewed with the attending physician, and ongoing clinical collaboration was maintained throughout the visit  Management / Treatments: See ED course above for medications, treatments administered, and clinical rationale.   Reevaluation of the patient after these medicines showed that the patient resolved/improved/worsened:23923::improved} I have reviewed the patients home medicines and have made adjustments as needed  Test Considered/Diagnostic tools:  Additional diagnostic testing was not considered based on the patient's presenting symptoms, risk factors, and initial clinical assessment.   ED Course / Reassessments: Problem List:  33 year old female presented for abdominal pain after surgical abortion. Initial assessment included history, physical exam, and review of prior medical records. Vital signs were obtained and monitored, and the patient remained stable throughout the stay. General physical exam included focused assessment of *** and revealed ***. Laboratory studies, imaging, and other ancillary studies were  obtained as clinically indicated, and key results included ***. Clinical decision tools were used where appropriate to support diagnostic accuracy and risk stratification. Management included ***, with ongoing reassessment. Pain and symptoms were addressed during the visit. The patient showed *** Patient response: *** Serial reassessments performed: {{yes/no:20286}}    Social determinants impacting care: {social izu:65875}  Consultations:  {consult:34125} Consult recommendations incorporated into plan: ***  Disposition: Disposition: {dispo:34126} Rationale for disposition: *** The disposition plan and rationale were discussed with the patient at the bedside, all questions were addressed, and the patient demonstrated understanding.  This note was produced using [employer]. While the provider has reviewed and verified all clinical information, transcription errors may remain.   {Document critical care time when appropriate  Document review of labs and clinical decision tools ie CHADS2VASC2, etc  Document your independent review of radiology images and any outside records  Document your discussion with family members, caretakers and with consultants  Document social determinants of health affecting pt's care  Document your decision making why or why not admission, treatments were needed:32947:::1}   Final diagnoses:  None    ED Discharge Orders     None

## 2024-10-26 NOTE — ED Notes (Signed)
 [place] in room

## 2024-10-26 NOTE — Telephone Encounter (Signed)
 Called patient after she eloped from the emergency department.  At the time of elopement, patient's pelvic ultrasound was pending, resulted without evidence of IUP or other pelvic complication.  Spoke with the patient.  Reports that she needed to go pick up her son, therefore she needed to leave the emergency department.  Reports that she was confirmed to have a IUP by [facility] prior to the procedural abortion.  Discussed that the ultrasound today did not demonstrate IUP which is consistent with her having received a procedural abortion.  Patient reports that she has some continued abdominal cramping, discussed use of NSAIDs for pain control.  Did discuss that patient has persistent elevation of her serum hCG, and that she needs to follow-up with [facility] and/or her OB/GYN to trend this value to 0, and to follow-up with [facility] and/or OB/GYN for reevaluation of her pain in the procedural state.  Additionally discussed return precautions.  Patient expresses understanding.

## 2024-10-26 NOTE — ED Triage Notes (Addendum)
 Pt bib EMS from home for abd pain since last Monday. Pt reports bloods clots originally that have since resolved from [facility] 9/10 pain. Took 2 ibuprofen  prior to EMS arrival. 130/90 BP 70 HR 99% RA 111 CBG 96 T 18 RR

## 2024-10-26 NOTE — ED Notes (Signed)
 Pt left without having IV removed or receiving dc paperwork. Unsure if pt removed IV. Not found in room

## 2024-10-27 LAB — GC/CHLAMYDIA PROBE AMP (~~LOC~~) NOT AT ARMC
Chlamydia: NEGATIVE
Comment: NEGATIVE
Comment: NORMAL
Neisseria Gonorrhea: NEGATIVE
